# Patient Record
Sex: Female | Born: 1975
Health system: Southern US, Community
[De-identification: ages and names within clinical notes are randomized; demographics above are authoritative.]

## PROBLEM LIST (undated history)

## (undated) DIAGNOSIS — E7800 Pure hypercholesterolemia, unspecified: Secondary | ICD-10-CM

## (undated) DIAGNOSIS — E119 Type 2 diabetes mellitus without complications: Secondary | ICD-10-CM

## (undated) DIAGNOSIS — K759 Inflammatory liver disease, unspecified: Secondary | ICD-10-CM

## (undated) DIAGNOSIS — N83209 Unspecified ovarian cyst, unspecified side: Secondary | ICD-10-CM

## (undated) DIAGNOSIS — I639 Cerebral infarction, unspecified: Secondary | ICD-10-CM

## (undated) DIAGNOSIS — I10 Essential (primary) hypertension: Secondary | ICD-10-CM

## (undated) HISTORY — DX: Cerebral infarction, unspecified: I63.9

---

## 1999-03-14 ENCOUNTER — Encounter: Payer: Self-pay | Admitting: Emergency Medicine

## 1999-03-14 ENCOUNTER — Emergency Department (HOSPITAL_COMMUNITY): Admission: EM | Admit: 1999-03-14 | Discharge: 1999-03-14 | Payer: Self-pay | Admitting: Emergency Medicine

## 1999-07-24 ENCOUNTER — Encounter: Payer: Self-pay | Admitting: Obstetrics & Gynecology

## 1999-07-24 ENCOUNTER — Inpatient Hospital Stay (HOSPITAL_COMMUNITY): Admission: AD | Admit: 1999-07-24 | Discharge: 1999-07-24 | Payer: Self-pay | Admitting: Obstetrics & Gynecology

## 1999-08-28 ENCOUNTER — Inpatient Hospital Stay (HOSPITAL_COMMUNITY): Admission: AD | Admit: 1999-08-28 | Discharge: 1999-08-28 | Payer: Self-pay | Admitting: Obstetrics

## 1999-10-18 ENCOUNTER — Other Ambulatory Visit: Admission: RE | Admit: 1999-10-18 | Discharge: 1999-10-18 | Payer: Self-pay | Admitting: Obstetrics and Gynecology

## 1999-11-17 ENCOUNTER — Inpatient Hospital Stay (HOSPITAL_COMMUNITY): Admission: AD | Admit: 1999-11-17 | Discharge: 1999-11-17 | Payer: Self-pay | Admitting: Obstetrics & Gynecology

## 1999-11-17 ENCOUNTER — Encounter: Payer: Self-pay | Admitting: Obstetrics and Gynecology

## 2000-01-01 ENCOUNTER — Ambulatory Visit (HOSPITAL_COMMUNITY): Admission: RE | Admit: 2000-01-01 | Discharge: 2000-01-01 | Payer: Self-pay | Admitting: *Deleted

## 2000-02-05 ENCOUNTER — Inpatient Hospital Stay (HOSPITAL_COMMUNITY): Admission: AD | Admit: 2000-02-05 | Discharge: 2000-02-05 | Payer: Self-pay | Admitting: *Deleted

## 2000-03-03 ENCOUNTER — Inpatient Hospital Stay (HOSPITAL_COMMUNITY): Admission: AD | Admit: 2000-03-03 | Discharge: 2000-03-03 | Payer: Self-pay | Admitting: Obstetrics & Gynecology

## 2000-03-03 ENCOUNTER — Encounter (INDEPENDENT_AMBULATORY_CARE_PROVIDER_SITE_OTHER): Payer: Self-pay | Admitting: Specialist

## 2000-03-03 ENCOUNTER — Inpatient Hospital Stay (HOSPITAL_COMMUNITY): Admission: AD | Admit: 2000-03-03 | Discharge: 2000-03-07 | Payer: Self-pay | Admitting: Obstetrics & Gynecology

## 2000-03-10 ENCOUNTER — Observation Stay (HOSPITAL_COMMUNITY): Admission: AD | Admit: 2000-03-10 | Discharge: 2000-03-11 | Payer: Self-pay | Admitting: Obstetrics & Gynecology

## 2000-03-10 ENCOUNTER — Encounter: Payer: Self-pay | Admitting: Obstetrics & Gynecology

## 2001-10-21 ENCOUNTER — Other Ambulatory Visit: Admission: RE | Admit: 2001-10-21 | Discharge: 2001-10-21 | Payer: Self-pay | Admitting: Obstetrics and Gynecology

## 2001-12-16 ENCOUNTER — Encounter: Admission: RE | Admit: 2001-12-16 | Discharge: 2002-03-16 | Payer: Self-pay | Admitting: Obstetrics and Gynecology

## 2002-01-12 ENCOUNTER — Inpatient Hospital Stay (HOSPITAL_COMMUNITY): Admission: AD | Admit: 2002-01-12 | Discharge: 2002-01-12 | Payer: Self-pay | Admitting: Obstetrics and Gynecology

## 2002-02-17 ENCOUNTER — Inpatient Hospital Stay (HOSPITAL_COMMUNITY): Admission: AD | Admit: 2002-02-17 | Discharge: 2002-02-19 | Payer: Self-pay | Admitting: Obstetrics and Gynecology

## 2006-05-12 ENCOUNTER — Emergency Department (HOSPITAL_COMMUNITY): Admission: EM | Admit: 2006-05-12 | Discharge: 2006-05-12 | Payer: Self-pay | Admitting: Family Medicine

## 2006-05-26 ENCOUNTER — Emergency Department (HOSPITAL_COMMUNITY): Admission: EM | Admit: 2006-05-26 | Discharge: 2006-05-26 | Payer: Self-pay | Admitting: Emergency Medicine

## 2007-02-28 ENCOUNTER — Inpatient Hospital Stay (HOSPITAL_COMMUNITY): Admission: AD | Admit: 2007-02-28 | Discharge: 2007-02-28 | Payer: Self-pay | Admitting: Obstetrics & Gynecology

## 2010-08-25 NOTE — Discharge Summary (Signed)
[facility]  PatientSHERRAL, DIROCCO Visit Number: [id number] MRN: [id number]          Service Type: Dictated by:   Maryelizabeth Rowan, M.D. Adm. Date:  03/03/00 Disc. Date: 03/07/00                             Discharge Summary  DATE OF BIRTH:                Jan 17, 1976  PRIMARY DIAGNOSES:            Term intrauterine pregnancy status post low transverse cesarean section.  HOSPITAL COURSE:              Patient was admitted in labor.  Did have some augmentation with low dose ______ and IUPC was placed.  Patient progressed to dilation of 7-8 cm, 90%, -1 but then had arrest of active phase of labor and therefore was taken to cesarean delivery.  Please see operative note. Postoperative course was uneventful.  Patient recovered well with no complications.  Patient was discharged home with pain medicine of Percocet. Was bottle feeding well.  Unsure of contraception use at time of discharge. Patient was instructed to follow up at [facility] in six weeks. Dictated by:   Maryelizabeth Rowan, M.D. DD:  07/25/00 TD:  07/26/00 Job: [id number]

## 2010-08-25 NOTE — Op Note (Signed)
NAMEPERLA, ECHAVARRIA                               ACCOUNT NO.:  [id number]   MEDICAL RECORD NO.:  [id number]                   PATIENT TYPE:  INP   LOCATION:  9198                                 FACILITY:  [facility]   PHYSICIAN:  Carrington Clamp, M.D.              DATE OF BIRTH:  04-23-1975   DATE OF PROCEDURE:  02/17/2002  DATE OF DISCHARGE:                                 OPERATIVE REPORT   PREOPERATIVE DIAGNOSES:  Prior cesarean section and suspected large for  gestational age baby in an A2 gestational diabetic.   POSTOPERATIVE DIAGNOSES:  Prior cesarean section and suspected large for  gestational age baby in an A2 gestational diabetic.   PROCEDURE:  Low transverse cesarean section.   SURGEON:  Carrington Clamp, M.D.   ASSISTANT:  Luvenia Redden, M.D.   ANESTHESIA:  Spinal.   ESTIMATED BLOOD LOSS:  700 cc.   IV FLUIDS:  3050 cc.   URINE OUTPUT:  200 cc.   COMPLICATIONS:  None.   FINDINGS:  Female infant vertex, Apgars 9 and 9, weight 8 pounds 5 ounces.  Normal tubes, ovaries, and uterus seen.   MEDICATIONS:  Cefotan and Pitocin.   PATHOLOGY:  None.   COUNTS:  Correct x3.   TECHNIQUE:  After adequate spinal anesthesia was achieved, the patient was  prepped and draped in the usual sterile fashion dorsal supine position with  a leftward tilt.  A Pfannenstiel skin incision was made with the scalpel,  slightly inferior to the prior scar, and then carried down to the fascia  with Bovie cautery.  The fascia was incised in the midline with the scalpel  and carried in a transverse curvilinear manner with the Mayo scissors.   The fascia was reflected superiorly and inferiorly from the rectus muscles  and the rectus muscles split in the midline.  The bellies superior to the  peritoneum was entered into bluntly and the peritoneum was incised in a  superior inferior manner with the Metzenbaum scissors with good  visualization of the bowel and the bladder. The bladder blade was  placed and  the vesicouterine fascia incised with the Metzenbaum scissors and the  bladder flap created bluntly.  The bladder blade was replaced and a 2 cm  transverse incision was made in the upper portion occupational therapy the  lower uterine segment.  This was done until clear fluid was noted on entry  into the amnion.  The incision was then extended in a transverse curvilinear  manner with the bandage scissors and the baby identified in the vertex  presentation and delivered without complication.  The baby was bulb  suctioned, the cord clamped and cut, and the baby handed to waiting  pediatrics.  The placenta was then delivered manually and the uterus exteriorized,  wrapped in wet lap, and cleared of all debris.  Uterine incision was closed  with a running locked  stitch of 0 Monocryl.  Hemostasis was achieved and the  uterus was replaced in the abdominal cavity and the cavity irrigated.  The  gutters were cleared of all debris, the uterine incision reinspected and  found to be hemostatic. The peritoneum was closed with a running stitch of 2-  0 Vicryl that included the rectus muscles separately in an inferior to  superior manner.  The fascia was closed with 0 Vicryl on one side and 0  Monocryl on the right-hand side.  The subcutaneous tissue was irrigated and  rendered hemostatic with the Bovie cautery.  The skin was closed with  staples.  The patient tolerated the procedure well.  Was returned to  recovery room in stable condition.                                               Carrington Clamp, M.D.    MH/MEDQ  D:  02/17/2002  T:  02/17/2002  Job:  [id number]

## 2010-08-25 NOTE — Discharge Summary (Signed)
   [name], [name]                               ACCOUNT NO.:  [id number]   MEDICAL RECORD NO.:  [id number]                   PATIENT TYPE:  INP   LOCATION:  9126                                 FACILITY:  [facility]   PHYSICIAN:  Carrington Clamp, M.D.              DATE OF BIRTH:  Jun 03, 1975   DATE OF ADMISSION:  02/17/2002  DATE OF DISCHARGE:  02/19/2002                                 DISCHARGE SUMMARY   FINAL DIAGNOSES:  1. Intrauterine pregnancy at 38+ weeks gestation.  2. History of prior cesarean section.  3. Fetal macrosomia.  4. Gestational diabetes mellitus, insulin controlled.   PROCEDURE:  Low transverse cesarean section.   SURGEON:  Carrington Clamp, M.D.   ASSISTANT:  Luvenia Redden, M.D.   COMPLICATIONS:  None.   HISTORY OF PRESENT ILLNESS:  This 35 year old G2, P1 presents at 38+ weeks  gestation for a repeat cesarean section. The patient had a prior cesarean  section with her first pregnancy.  This pregnancy, the patient's antepartum  course was complicated by late prenatal care, history of cesarean section,  also gestational diabetes mellitus that was insulin controlled, and a little  bit of a language barrier.  Toward the end of the patient's pregnancy, she  suspected to have a large for gestational age baby.  At 36 weeks, the  patient had an estimated fetal weight of 3800 g.   HOSPITAL COURSE:  At this point, the patient was taken to the operating room  on February 17, 2002, by Dr. Carrington Clamp, where a repeat low transverse  cesarean section was performed with the delivery of an 8 pound 5 ounce  female infant with Apgars of 9 and 9.  The delivery went without  complications.  The patient's postoperative course was benign without  significant fevers.  The patient was felt ready for discharge on  postoperative day #2.   DIET:  The patient was sent home on a regular diet.   ACTIVITY:  She was told to decrease activity.   DISCHARGE MEDICATIONS:  She was  told to continue prenatal vitamins and  FESO4.  She was given Percocet one to two every four hours as needed for  pain.  She was told she could use over-the-counter pain medicines as needed.   FOLLOW UP:  She was to follow up in the office the next day for staple  removal and to check random glucoses daily.     Leilani Able, P.A.-C.                Carrington Clamp, M.D.   MB/MEDQ  D:  04/06/2002  T:  04/06/2002  Job:  [id number]

## 2010-08-25 NOTE — Op Note (Signed)
[facility]  Patient:    [name], [name]                            MRN: [id number] Proc. Date: 03/04/00 Adm. Date:  [id number] Attending:  Antionette Char                           Operative Report  PREOPERATIVE DIAGNOSIS:       Arrested active phase of labor.  POSTOPERATIVE DIAGNOSIS:      Arrested active phase of labor.  OPERATION:                    Low transverse cesarean section.  SURGEON:                      Conni Elliot, M.D.  ANESTHESIA:                   Continuous lumbar epidural.  OPERATIVE FINDINGS:           A 7 pound 8 ounce female, Apgars 8 and 9 and one to five minutes respectively.  Cord pH and placenta were sent.  DESCRIPTION OF PROCEDURE:     After placing the patient under continuous lumbar epidural anesthetic, the patients was placed supine in a left tiltward position and receiving oxygen.  The abdomen was prepped and draped in sterile fashion.  A low transverse Pfannenstiel incision was made.  The incision was made through skin and subcutaneous fascia, rectus muscles separated in the midline.  Peritoneal cavity was entered.  Bladder flap was created.  A low transuterine incision was made.  The baby was in the vertex presentation.  The baby did have meconium, so it was ______ suctioned prior to delivery of the chest.  The remainder of the body was delivered.  The cord was double clamped and cut.  The baby was handed to the neonatologist in attendance.  The placenta was delivered simultaneously.  The uterus, bladder flap, ______ peritoneum, fascia and the ______ closed in usual fashion.  ESTIMATED BLOOD LOSS:  Approximately 800 cc.  Sponge, needle and instrument counts were correct. DD:  03/04/00 TD:  03/05/00 Job: [id number]

## 2011-01-16 LAB — URINALYSIS, ROUTINE W REFLEX MICROSCOPIC
Glucose, UA: 250 — AB
Ketones, ur: 15 — AB
Nitrite: POSITIVE — AB
Protein, ur: >300 — AB
Specific Gravity, Urine: 1.015
Urobilinogen, UA: >8 — ABNORMAL HIGH
pH: 6.5

## 2011-01-16 LAB — POCT PREGNANCY, URINE
Operator id: 14011
Preg Test, Ur: NEGATIVE

## 2011-01-16 LAB — URINE CULTURE: Colony Count: >=100000

## 2011-01-16 LAB — URINE MICROSCOPIC-ADD ON

## 2011-06-17 ENCOUNTER — Emergency Department (HOSPITAL_COMMUNITY): Payer: Self-pay

## 2011-06-17 ENCOUNTER — Other Ambulatory Visit: Payer: Self-pay

## 2011-06-17 ENCOUNTER — Emergency Department (HOSPITAL_COMMUNITY)
Admission: EM | Admit: 2011-06-17 | Discharge: 2011-06-17 | Disposition: A | Discharge status: Home / Self Care | Payer: Self-pay | Attending: Emergency Medicine | Admitting: Emergency Medicine

## 2011-06-17 ENCOUNTER — Encounter (HOSPITAL_COMMUNITY): Payer: Self-pay | Admitting: Emergency Medicine

## 2011-06-17 DIAGNOSIS — I10 Essential (primary) hypertension: Secondary | ICD-10-CM | POA: Insufficient documentation

## 2011-06-17 DIAGNOSIS — R079 Chest pain, unspecified: Secondary | ICD-10-CM | POA: Insufficient documentation

## 2011-06-17 DIAGNOSIS — Z79899 Other long term (current) drug therapy: Secondary | ICD-10-CM | POA: Insufficient documentation

## 2011-06-17 DIAGNOSIS — R1013 Epigastric pain: Secondary | ICD-10-CM | POA: Insufficient documentation

## 2011-06-17 DIAGNOSIS — E7800 Pure hypercholesterolemia, unspecified: Secondary | ICD-10-CM | POA: Insufficient documentation

## 2011-06-17 HISTORY — DX: Pure hypercholesterolemia, unspecified: E78.00

## 2011-06-17 HISTORY — DX: Essential (primary) hypertension: I10

## 2011-06-17 LAB — COMPREHENSIVE METABOLIC PANEL
ALT: 58 U/L — ABNORMAL HIGH (ref 0–35)
AST: 29 U/L (ref 0–37)
Albumin: 3.8 g/dL (ref 3.5–5.2)
Alkaline Phosphatase: 51 U/L (ref 39–117)
BUN: 9 mg/dL (ref 6–23)
CO2: 23 mEq/L (ref 19–32)
Calcium: 9.2 mg/dL (ref 8.4–10.5)
Chloride: 106 mEq/L (ref 96–112)
Creatinine, Ser: 0.72 mg/dL (ref 0.50–1.10)
GFR calc Af Amer: >90 mL/min (ref >90)
GFR calc non Af Amer: >90 mL/min (ref >90)
Glucose, Bld: 108 mg/dL — ABNORMAL HIGH (ref 70–99)
Potassium: 3.7 mEq/L (ref 3.5–5.1)
Sodium: 139 mEq/L (ref 135–145)
Total Bilirubin: 0.5 mg/dL (ref 0.3–1.2)
Total Protein: 7.4 g/dL (ref 6.0–8.3)

## 2011-06-17 LAB — CBC
HCT: 38.7 % (ref 36.0–46.0)
Hemoglobin: 13.9 g/dL (ref 12.0–15.0)
MCH: 30 pg (ref 26.0–34.0)
MCHC: 35.9 g/dL (ref 30.0–36.0)
MCV: 83.6 fL (ref 78.0–100.0)
Platelets: 303 10*3/uL (ref 150–400)
RBC: 4.63 MIL/uL (ref 3.87–5.11)
RDW: 12.5 % (ref 11.5–15.5)
WBC: 5.8 10*3/uL (ref 4.0–10.5)

## 2011-06-17 LAB — DIFFERENTIAL
Basophils Absolute: 0 10*3/uL (ref 0.0–0.1)
Basophils Relative: 0 % (ref 0–1)
Eosinophils Absolute: 0.1 10*3/uL (ref 0.0–0.7)
Eosinophils Relative: 1 % (ref 0–5)
Lymphocytes Relative: 31 % (ref 12–46)
Lymphs Abs: 1.8 10*3/uL (ref 0.7–4.0)
Monocytes Absolute: 0.4 10*3/uL (ref 0.1–1.0)
Monocytes Relative: 7 % (ref 3–12)
Neutro Abs: 3.6 10*3/uL (ref 1.7–7.7)
Neutrophils Relative %: 62 % (ref 43–77)

## 2011-06-17 LAB — LIPASE, BLOOD: Lipase: 33 U/L (ref 11–59)

## 2011-06-17 MED ORDER — OMEPRAZOLE 20 MG PO CPDR: 20.0000 mg | DELAYED_RELEASE_CAPSULE | Freq: Every day | ORAL | Status: AC

## 2011-06-17 MED ORDER — FAMOTIDINE IN NACL 20-0.9 MG/50ML-% IV SOLN
20.0000 mg | Freq: Once | INTRAVENOUS | Status: AC
  Administered 2011-06-17: 20 mg via INTRAVENOUS
  Filled 2011-06-17: qty 50

## 2011-06-17 MED ORDER — ONDANSETRON HCL 4 MG/2ML IJ SOLN
4.0000 mg | Freq: Once | INTRAMUSCULAR | Status: AC
  Administered 2011-06-17: 4 mg via INTRAVENOUS
  Filled 2011-06-17: qty 2

## 2011-06-17 MED ORDER — GI COCKTAIL ~~LOC~~
30.0000 mL | Freq: Once | ORAL | Status: AC
  Administered 2011-06-17: 30 mL via ORAL
  Filled 2011-06-17: qty 30

## 2011-06-17 NOTE — ED Notes (Signed)
Pt presents with c/o epigastric pain radiating into abdomen x 2 weeks.

## 2011-06-17 NOTE — ED Provider Notes (Signed)
History     CSN: [id number]  Arrival date & time 06/17/11  1122   First MD Initiated Contact with Patient 06/17/11 1230      Chief Complaint  Patient presents with  . Chest Pain    HPI The patient presents with 2 weeks of pain in her epigastrium.  The pain is burning, radiating from her epigastrium to her sternum.  Pain is not relieved with OTC medication.  The pain is not exertional or pleuritic.  The patient denies dyspnea.  Denies any vomiting or diarrhea. Past Medical History  Diagnosis Date  . Hypertension   . Hypercholesteremia     Past Surgical History  Procedure Date  . Cesarean section     History reviewed. No pertinent family history.  History  Substance Use Topics  . Smoking status: Never Smoker   . Smokeless tobacco: Not on file  . Alcohol Use: No    OB History    Grav Para Term Preterm Abortions TAB SAB Ect Mult Living                  Review of Systems  All other systems reviewed and are negative.    Allergies  Review of patient's allergies indicates no known allergies.  Home Medications   Current Outpatient Rx  Name Route Sig Dispense Refill  . ATORVASTATIN CALCIUM 20 MG PO TABS Oral Take 20 mg by mouth daily.    Marland Kitchen LISINOPRIL-HYDROCHLOROTHIAZIDE 20-25 MG PO TABS Oral Take 1 tablet by mouth daily.    Marland Kitchen METFORMIN HCL 500 MG PO TABS Oral Take 500 mg by mouth daily.      BP 134/91  Pulse 91  Temp(Src) 98.6 F (37 C) (Oral)  Resp 17  SpO2 97%  LMP 06/11/2011  Physical Exam  Nursing note and vitals reviewed. Constitutional: She is oriented to person, place, and time. She appears well-developed and well-nourished. No distress.  HENT:  Head: Normocephalic and atraumatic.  Eyes: Conjunctivae and EOM are normal.  Cardiovascular: Normal rate and regular rhythm.   Pulmonary/Chest: Effort normal and breath sounds normal. No stridor. No respiratory distress.  Abdominal: She exhibits no distension.  Musculoskeletal: She exhibits no edema.    Neurological: She is alert and oriented to person, place, and time. No cranial nerve deficit.  Skin: Skin is warm and dry.  Psychiatric: She has a normal mood and affect.    ED Course  Procedures (including critical care time)   Labs Reviewed  CBC  DIFFERENTIAL  COMPREHENSIVE METABOLIC PANEL  LIPASE, BLOOD   No results found.   No diagnosis found.   Date: 06/17/2011  Rate: 88  Rhythm: normal sinus rhythm  QRS Axis: normal  Intervals: normal  ST/T Wave abnormalities: normal  Conduction Disutrbances:none  Narrative Interpretation:   Old EKG Reviewed: none available Pronounced Q-waves anterior BORDERLINE ECG  CXR reveiwed by me  Pulse ox 100% ra- normal  Cardiac 85 sr, normal   2:49 PM The patient notes a substantial improvement in her condition following GI cocktail, fluids, Pepcid. MDM  This generally well 36 year old female presents with chest pain, which the patient specifies as epigastric discomfort with burning sensation to her sternum.  The patient's youth, the absence of cardiac risk factors, her improvement following a GI cocktail and Pepcid his reassuring for the high probability of this being gastric in origin.  The patient's labs were reassuring, as were her unremarkable vital signs and essentially unremarkable a CT G.  The patient was discharged in stable  condition on a proton pump inhibitor.  Gerhard Munch, MD 06/17/11 1450

## 2012-09-21 ENCOUNTER — Ambulatory Visit (INDEPENDENT_AMBULATORY_CARE_PROVIDER_SITE_OTHER): Payer: BC Managed Care – PPO | Admitting: Emergency Medicine

## 2012-09-21 VITALS — BP 160/110 | HR 93 | Temp 98.2°F | Resp 16 | Ht 60.25 in | Wt 153.2 lb

## 2012-09-21 DIAGNOSIS — Z0000 Encounter for general adult medical examination without abnormal findings: Secondary | ICD-10-CM

## 2012-09-21 DIAGNOSIS — E782 Mixed hyperlipidemia: Secondary | ICD-10-CM

## 2012-09-21 DIAGNOSIS — I10 Essential (primary) hypertension: Secondary | ICD-10-CM

## 2012-09-21 DIAGNOSIS — E119 Type 2 diabetes mellitus without complications: Secondary | ICD-10-CM

## 2012-09-21 LAB — POCT URINALYSIS DIPSTICK
Bilirubin, UA: NEGATIVE
Blood, UA: NEGATIVE
Glucose, UA: 100
Ketones, UA: NEGATIVE
Leukocytes, UA: NEGATIVE
Nitrite, UA: NEGATIVE
Protein, UA: NEGATIVE
Spec Grav, UA: <=1.005
Urobilinogen, UA: 0.2
pH, UA: 6

## 2012-09-21 LAB — POCT CBC
Granulocyte percent: 64.7 %G (ref 37–80)
HCT, POC: 48.7 % — AB (ref 37.7–47.9)
Hemoglobin: 15.6 g/dL (ref 12.2–16.2)
Lymph, poc: 1.8 (ref 0.6–3.4)
MCH, POC: 27.7 pg (ref 27–31.2)
MCHC: 32 g/dL (ref 31.8–35.4)
MCV: 86.4 fL (ref 80–97)
MID (cbc): 0.6 (ref 0–0.9)
MPV: 9.8 fL (ref 0–99.8)
POC Granulocyte: 4.3 (ref 2–6.9)
POC LYMPH PERCENT: 26.7 %L (ref 10–50)
POC MID %: 8.6 %M (ref 0–12)
Platelet Count, POC: 317 10*3/uL (ref 142–424)
RBC: 5.64 M/uL — AB (ref 4.04–5.48)
RDW, POC: 13.3 %
WBC: 6.6 10*3/uL (ref 4.6–10.2)

## 2012-09-21 LAB — LIPID PANEL
Cholesterol: 215 mg/dL — ABNORMAL HIGH (ref 0–200)
HDL: 60 mg/dL (ref >39)
LDL Cholesterol: 127 mg/dL — ABNORMAL HIGH (ref 0–99)
Total CHOL/HDL Ratio: 3.6 Ratio
Triglycerides: 140 mg/dL (ref <150)
VLDL: 28 mg/dL (ref 0–40)

## 2012-09-21 LAB — COMPREHENSIVE METABOLIC PANEL
ALT: 37 U/L — ABNORMAL HIGH (ref 0–35)
AST: 19 U/L (ref 0–37)
Albumin: 4.1 g/dL (ref 3.5–5.2)
Alkaline Phosphatase: 80 U/L (ref 39–117)
BUN: 10 mg/dL (ref 6–23)
CO2: 27 mEq/L (ref 19–32)
Calcium: 9.2 mg/dL (ref 8.4–10.5)
Chloride: 103 mEq/L (ref 96–112)
Creat: 0.66 mg/dL (ref 0.50–1.10)
Glucose, Bld: 232 mg/dL — ABNORMAL HIGH (ref 70–99)
Potassium: 4.2 mEq/L (ref 3.5–5.3)
Sodium: 137 mEq/L (ref 135–145)
Total Bilirubin: 0.7 mg/dL (ref 0.3–1.2)
Total Protein: 6.9 g/dL (ref 6.0–8.3)

## 2012-09-21 LAB — POCT WET PREP WITH KOH
Clue Cells Wet Prep HPF POC: 100
KOH Prep POC: NEGATIVE
Trichomonas, UA: NEGATIVE
Yeast Wet Prep HPF POC: NEGATIVE

## 2012-09-21 LAB — POCT UA - MICROSCOPIC ONLY
Bacteria, U Microscopic: NEGATIVE
Casts, Ur, LPF, POC: NEGATIVE
Crystals, Ur, HPF, POC: NEGATIVE
Mucus, UA: NEGATIVE
RBC, urine, microscopic: NEGATIVE
Yeast, UA: NEGATIVE

## 2012-09-21 LAB — TSH: TSH: 3.369 u[IU]/mL (ref 0.350–4.500)

## 2012-09-21 LAB — POCT GLYCOSYLATED HEMOGLOBIN (HGB A1C): Hemoglobin A1C: 10.9

## 2012-09-21 MED ORDER — METRONIDAZOLE 500 MG PO TABS: 500.0000 mg | ORAL_TABLET | Freq: Two times a day (BID) | ORAL | Status: DC

## 2012-09-21 MED ORDER — LISINOPRIL-HYDROCHLOROTHIAZIDE 20-25 MG PO TABS: 1.0000 | ORAL_TABLET | Freq: Every day | ORAL | Status: DC

## 2012-09-21 MED ORDER — METFORMIN HCL ER 500 MG PO TB24: ORAL_TABLET | ORAL | Status: DC

## 2012-09-21 NOTE — Progress Notes (Signed)
[facility] [address] [place] [address] [phone]  Date:  09/21/2012   Name:  [name]   DOB:  08-15-1975   MRN:  [id number]  PCP:  No PCP Per Patient    Chief Complaint: Annual Exam   History of Present Illness:  [name] is a 37 y.o. very pleasant female patient who presents with the following:  For wellness examination.  No medication. Non english speaker.  Up to date on immunizations.  Works in a [employer].  Was on lipid drug, metformin, lisinopril but stopped due to cost.  There are no active problems to display for this patient.   Past Medical History  Diagnosis Date  . Hypertension   . Hypercholesteremia     Past Surgical History  Procedure Laterality Date  . Cesarean section      History  Substance Use Topics  . Smoking status: Never Smoker   . Smokeless tobacco: Not on file  . Alcohol Use: No    No family history on file.  No Known Allergies  Medication list has been reviewed and updated.  No current outpatient prescriptions on file prior to visit.   No current facility-administered medications on file prior to visit.    Review of Systems:  As per HPI, otherwise negative.    Physical Examination: Filed Vitals:   09/21/12 0829  BP: 160/110  Pulse: 93  Temp: 98.2 F (36.8 C)  Resp: 16   Filed Vitals:   09/21/12 0829  Height: 5' 0.25" (1.53 m)  Weight: 153 lb 3.2 oz (69.491 kg)   Body mass index is 29.69 kg/(m^2). Ideal Body Weight: Weight in (lb) to have BMI = 25: 128.8  GEN: obese, NAD, Non-toxic, A & O x 3 HEENT: Atraumatic, Normocephalic. Neck supple. No masses, No LAD. Ears and Nose: No external deformity. CV: RRR, No M/G/R. No JVD. No thrill. No extra heart sounds. PULM: CTA B, no wheezes, crackles, rhonchi. No retractions. No resp. distress. No accessory muscle use. ABD: S, NT, ND, +BS. No rebound. No HSM. EXTR: No c/c/e NEURO Normal gait.  PSYCH: Normally interactive. Conversant. Not  depressed or anxious appearing.  Calm demeanor.  Pelvic - normal external genitalia, vulva, vagina, cervix, uterus and adnexa   Assessment and Plan: Hypertension NIDDM Hyperlipidemia by history Treat based on labs Follow up in 104 in 1 month She has little insight into medical problems or the need to chronically treat them.  BV  Signed,  Phillips Odor, MD   Results for orders placed in visit on 09/21/12  POCT CBC      Result Value Range   WBC 6.6  4.6 - 10.2 K/uL   Lymph, poc 1.8  0.6 - 3.4   POC LYMPH PERCENT 26.7  10 - 50 %L   MID (cbc) 0.6  0 - 0.9   POC MID % 8.6  0 - 12 %M   POC Granulocyte 4.3  2 - 6.9   Granulocyte percent 64.7  37 - 80 %G   RBC 5.64 (*) 4.04 - 5.48 M/uL   Hemoglobin 15.6  12.2 - 16.2 g/dL   HCT, POC 96.2 (*) 95.2 - 47.9 %   MCV 86.4  80 - 97 fL   MCH, POC 27.7  27 - 31.2 pg   MCHC 32.0  31.8 - 35.4 g/dL   RDW, POC 84.1     Platelet Count, POC 317  142 - 424 K/uL   MPV 9.8  0 - 99.8 fL  POCT UA - MICROSCOPIC ONLY      Result Value Range   WBC, Ur, HPF, POC 0-1     RBC, urine, microscopic neg     Bacteria, U Microscopic neg     Mucus, UA neg     Epithelial cells, urine per micros 1-2     Crystals, Ur, HPF, POC neg     Casts, Ur, LPF, POC neg     Yeast, UA neg    POCT URINALYSIS DIPSTICK      Result Value Range   Color, UA yellow     Clarity, UA clear     Glucose, UA 100     Bilirubin, UA neg     Ketones, UA neg     Spec Grav, UA <=1.005     Blood, UA neg     pH, UA 6.0     Protein, UA neg     Urobilinogen, UA 0.2     Nitrite, UA neg     Leukocytes, UA Negative    POCT GLYCOSYLATED HEMOGLOBIN (HGB A1C)      Result Value Range   Hemoglobin A1C 10.9    POCT WET PREP WITH KOH      Result Value Range   Trichomonas, UA Negative     Clue Cells Wet Prep HPF POC 100%     Epithelial Wet Prep HPF POC tntc     Yeast Wet Prep HPF POC neg     Bacteria Wet Prep HPF POC 4+     RBC Wet Prep HPF POC 0-1     WBC Wet Prep HPF POC 4-10      KOH Prep POC Negative

## 2012-09-21 NOTE — Patient Instructions (Addendum)
Cholesterol Cholesterol is a white, waxy, fat-like protein needed by your body in small amounts. The liver makes all the cholesterol you need. It is carried from the liver by the blood through the blood vessels. Deposits (plaque) may build up on blood vessel walls. This makes the arteries narrower and stiffer. Plaque increases the risk for heart attack and stroke. You cannot feel your cholesterol level even if it is very high. The only way to know is by a blood test to check your lipid (fats) levels. Once you know your cholesterol levels, you should keep a record of the test results. Work with your caregiver to to keep your levels in the desired range. WHAT THE RESULTS MEAN:  Total cholesterol is a rough measure of all the cholesterol in your blood.  LDL is the so-called bad cholesterol. This is the type that deposits cholesterol in the walls of the arteries. You want this level to be low.  HDL is the good cholesterol because it cleans the arteries and carries the LDL away. You want this level to be high.  Triglycerides are fat that the body can either burn for energy or store. High levels are closely linked to heart disease. DESIRED LEVELS:  Total cholesterol below 200.  LDL below 100 for people at risk, below 70 for very high risk.  HDL above 50 is good, above 60 is best.  Triglycerides below 150. HOW TO LOWER YOUR CHOLESTEROL:  Diet.  Choose fish or white meat chicken and [place], roasted or baked. Limit fatty cuts of red meat, fried foods, and processed meats, such as sausage and lunch meat.  Eat lots of fresh fruits and vegetables. Choose whole grains, beans, pasta, potatoes and cereals.  Use only small amounts of olive, corn or canola oils. Avoid butter, mayonnaise, shortening or palm kernel oils. Avoid foods with trans-fats.  Use skim/nonfat milk and low-fat/nonfat yogurt and cheeses. Avoid whole milk, cream, ice cream, egg yolks and cheeses. Healthy desserts include angel food  cake, ginger snaps, animal crackers, hard candy, popsicles, and low-fat/nonfat frozen yogurt. Avoid pastries, cakes, pies and cookies.  Exercise.  A regular program helps decrease LDL and raises HDL.  Helps with weight control.  Do things that increase your activity level like gardening, walking, or taking the stairs.  Medication.  May be prescribed by your caregiver to help lowering cholesterol and the risk for heart disease.  You may need medicine even if your levels are normal if you have several risk factors. HOME CARE INSTRUCTIONS   Follow your diet and exercise programs as suggested by your caregiver.  Take medications as directed.  Have blood work done when your caregiver feels it is necessary. MAKE SURE YOU:   Understand these instructions.  Will watch your condition.  Will get help right away if you are not doing well or get worse. Document Released: 12/19/2000 Document Revised: 06/18/2011 Document Reviewed: 06/11/2007 [facility] Patient Information 2014 [place], [place]. T?ng Huy?t p (Hypertension) Khi tim ??p, n ??y mu l?u thng qua cc ??ng m?ch. L?c ??y ny ???c g?i l huy?t p. N?u huy?t p qu cao, ng??i ta g?i ? l ch?ng t?ng huy?t p (HTN) hay cao huy?t p. Ch?ng t?ng huy?t p r?t nguy hi?m v b?n c th? m?c ph?i m khng hay bi?t g. Huy?t p cao c ngh?a l tim c?a b?n ph?i lm vi?c nhi?u h?n ?? b?m mu. Cc ??ng m?ch c th? b? h?p ho?c x? c?ng. T?ng cng cho tim lm b?n c nguy  c? m?c b?nh tim, ??t qu?, v cc b?nh l khc. Huy?t p bao g?m hai con s?, m?t ch? s? cao h?n trn m?t ch? s? th?p h?n, v d? nh? 110/72. Huy?t p ???c ghi l "110 trn 72". L t??ng l d??i 120 cho ch? s? trn (tm thu) v d??i 80 cho ch? s? d??i (tm tr??ng). Ghi huy?t p c?a b?n ngy hm nay.  B?n nn h?t s?c ch  ??n huy?t p c?a mnh n?u ?ang m?c ph?i nh?ng c?n b?nh no ? nh? l:  Suy tim  Ti?n s? b? ?au tim  Ti?u ???ng  B?nh th?n mn tnh  Ti?n s? ??t qu?  Nhi?u nguy  c? m?c b?nh tim. ?? xem c b? m?c ch?ng t?ng huy?t p hay khng, b?n nn ?o huy?t p khi ?ang ng?i v?i cnh tay ???c ??t ngang t?m tim c?a b?n. Nn ?o huy?t p t nh?t hai l?n. Ch? s? huy?t p cao m?t l?n (??c bi?t l ? [place] C?p C?u) khng c ngh?a l b?n c?n ph?i ???c ?i?u tr?. C th? c cc c?n b?nh m huy?t p khc nhau gi?a tay tri v tay ph?i. ?i?u quan tr?ng l ph?i s?m ?i khm ?? Bc s? ki?m tra l?i. ?a s? ng??i m?c ph?i ch?ng t?ng huy?t p nguyn pht, c ngh?a l khng c nguyn nhn c? th?. C th? lm gi?m d?ng cao huy?t p ny b?ng cch thay ??i y?u t? l?i s?ng nh? l:  C?ng th?ng  Ht thu?c  Thi?u t?p th? d?c  Th?a cn  S? d?ng ma ty/thu?c l/r??u  Ch? ?? ?n t mu?i ?a s? ng??i khng c cc tri?u ch?ng do cao huy?t p cho ??n khi b?nh gy t?n h?i ??n c? th?. Vi?c ?i?u tr? hi?u qu? th??ng c th? ng?n ch?n, tr hon hay gi?m m?c ?? t?n h?i ?. ?I?U TR? Vi?c ?i?u tr? ch?ng cao huy?t p, khi ? xc ??nh ???c nguyn nhn, ???c nh?m vo nguyn nhn gy b?nh ?. C r?t nhi?u thu?c ?i?u tr? ch?ng t?ng huy?t p. Cc thu?c ny c th? ???c chia ra thnh vi lo?i, v Bc s? s? gip b?n ch?n thu?c t?t nh?t cho mnh. Thu?c c th? c cc tc d?ng ph?. B?n v Bc s? nn xem l?i nh?ng tc d?ng ph? ?. N?u huy?t p v?n cao sau khi b?n ? thay ??i l?i s?ng ho?c b?t ??u dng thu?c th,  C th? c?n ph?i thay ??i (cc) thu?c.  C th? c?n ph?i ch tm ??n nh?ng v?n ?? khc.  Ph?i ch?c ch?n r?ng b?n hi?u toa thu?c, v bi?t r khi no dng thu?c v dng thu?c nh? th? no.  Ph?i ch?c ch?n r?ng b?n g?p Bc s? ?? ti khm trong khung th?i gian ???c khuy?n co (th??ng l trong vng hai tu?n) ?? ki?m tra l?i huy?t p v xem l?i thu?c.  N?u dng nhi?u h?n m?t lo?i thu?c ?? h? huy?t p, ph?i ch?c ch?n r?ng b?n bi?t khi no nn dng thu?c v dng nh? th? no. Dng cng lc hai lo?i thu?c c th? d?n ??n huy?t p h? qu th?p. NH?P VI?N NGAY L?P T?C N?U XU?T HI?N:  Nh?c ??u d? d?i, th? l?c thay ??i hay b?  m?, ho?c l l?n.  Y?u ho?c t li?t b?t th??ng, ho?c c c?m gic chong ng?t.  ?au b?ng hay ng?c tr?m tr?ng, i m?a, ho?c kh th?. HY CH?C CH?N R?NG B?N:  Hi?u nh?ng ch? d?n ny.  S? theo  di tnh tr?ng c?a b?n.  S? nh?n ???c s? gip ?? ngay l?p t?c n?u b?n khng kh?e ho?c tr? nn t? h?n. Document Released: 03/26/2005 Document Revised: 06/18/2011 [facility] Patient Information 2014 [place], [place]. ?i Tho ???ng Lo?i 2, Ng??i L?n  (Type 2 Diabetes Mellitus, Adult) ?i tho ???ng lo?i 2, th??ng g?i ??n gi?n l b?nh ti?u ???ng lo?i 2, l m?t b?nh ko di (mn tnh). Trong b?nh ti?u ???ng lo?i 2, tuy?n t?y khng s?n xu?t ?? insulin (hocmon), cc t? bo t ?p ?ng v?i insulin ???c t?o ra (?? khng insulin) ho?c c? hai. Thng th??ng, insulin v?n chuy?n ???ng t? th?c ?n vo cc t? bo m. Cc t? bo m s? d?ng ???ng cho n?ng l??ng. Thi?u h?t insulin ho?c khng ?p ?ng bnh th??ng v?i insulin gy ra ???ng d? th?a tch t? trong mu thay v ?i vo cc t? bo m. K?t qu? l, l??ng ???ng trong mu cao (t?ng ???ng huy?t) pht tri?n. ?nh h??ng c?a hm l??ng ???ng (glucoza) cao c th? gy ra nhi?u bi?n ch?ng.  B?nh ti?u ???ng lo?i 2 tr??c ?y cn ???c g?i l b?nh ti?u ???ng kh?i pht ? ng??i l?n nh?ng n c th? x?y ra ? m?i l?a tu?i.  CC Y?U T? NGUY C?  M?t ng??i d? pht tri?n b?nh ti?u ???ng lo?i 2 n?u c ai ? trong gia ?nh b? b?nh ny ??ng th?i c m?t ho?c nhi?u y?u t? nguy c? chnh sau ?y:   Th?a cn.  L?i s?ng t ho?t ??ng.  Ti?n s? lin t?c ?n th?c ?n giu n?ng l??ng. Duy tr cn n?ng bnh th??ng v ho?t ??ng th? ch?t th??ng xuyn c th? lm gi?m nguy c? pht tri?n b?nh ti?u ???ng lo?i 2.  TRI?U CH?NG  M?t ng??i b? b?nh ti?u ???ng lo?i 2 c th? khng c cc tri?u ch?ng ban ??u. Cc tri?u ch?ng c?a b?nh ti?u ???ng lo?i 2 xu?t hi?n t? t?. Cc tri?u ch?ng bao g?m:   Gia t?ng kht n??c (ch?ng kht nhi?u).  Gia t?ng ti?u ti?n (?a ni?u).  T?ng ?i ti?u vo ban ?m (ti?u ?m).  Gi?m cn. Hi?n  t??ng gi?m cn ny c th? r?t nhanh.  Th??ng xuyn b? nhi?m trng ti pht.  M?t m?i.  Suy nh??c.  Thay ??i th? l?c, nh? nhn m?.  Mi tri cy trong h?i th? c?a b?n.  ?au b?ng.  Bu?n nn ho?c nn m?a.  V?t c?t ho?c v?t b?m tm lu lnh.  Ng?a ran ho?c t ? bn tay ho?c bn chn. CH?N ?ON  B?nh ti?u ???ng lo?i 2 th??ng khng ???c ch?n ?on cho ??n khi xu?t hi?n bi?n ch?ng c?a b?nh ti?u ???ng. B?nh ti?u ???ng lo?i 2 ???c ch?n ?on khi xu?t hi?n cc tri?u ch?ng c?ng nh? bi?n ch?ng v khi l??ng ???ng huy?t t?ng. L??ng ???ng huy?t c th? ???c ki?m tra b?ng m?t ho?c nhi?u xt nghi?m mu sau ?y:   Xt nghi?m ???ng huy?t nh?n ?n. B?n s? khng ???c php ?n trong t nh?t 8 ti?ng tr??c khi l?y m?u mu.  Xt nghi?m ???ng huy?t ng?u nhin. ???ng huy?t ???c xt nghi?m b?t k? lc no trong ngy, b?t k? b?n ?n lc no.  Xt nghi?m ???ng huy?t A1c huy?t c?u t?. Xt nghi?m A1c huy?t c?u t? cung c?p thng tin v? ki?m sot ???ng huy?t trong 3 thng tr??c ?.  Xt nghi?m dung n?p glucoza theo ???ng u?ng (OGTT). ???ng huy?t c?a b?n ???c ?o sau khi b?n ch?a ?n (?n  chay) trong 2 gi? v sau ? sau khi b?n u?ng ?? u?ng c ch?a glucoza. ?I?U TR?   B?n c th? c?n dng insulin ho?c thu?c tr? ti?u ???ng hng ngy ?? gi? cho l??ng ???ng huy?t trong ph?m vi mong mu?n.  B?n s? c?n s? d?ng li?u insulin ph h?p v?i vi?c t?p th? d?c v l?a ch?n th?c ph?m c l?i cho s?c kh?e. M?c tiu ?i?u tr? l ?? duy tr l??ng ???ng trong mu tr??c b?a ?n (glucoza tr??c b?a ?n) ? m?c 70-130 mg/dL.  H??NG D?N CH?M [place] T?I NH   L??ng A1c huy?t c?u t? c?a b?n ???c ki?m tra hai l?n m?t n?m.  Th?c hi?n gim st ???ng huy?t hng ngy theo ch? d?n c?a chuyn gia ch?m [place] y t?.  [place] n??c ti?u khi b?n b? b?nh v theo ch? d?n c?a chuyn gia ch?m [place] y t?.  S? d?ng thu?c tr? ti?u ???ng ho?c insulin theo ch? d?n c?a chuyn gia ch?m [place] y t? ?? duy tr l??ng ???ng huy?t trong ph?m vi mong mu?n.  Khng bao gi? ?? h?t  thu?c tr? ti?u ???ng ho?c insulin. Insulin c?n hng ngy.  ?i?u ch?nh insulin d?a vo m?c ?? s? d?ng hy?rat-cacbon c?a b?n. Hy?rat-cacbon c th? lm t?ng l??ng ???ng huy?t nh?ng c?n ph?i ???c bao g?m trong ch? ?? ?n u?ng c?a b?n. Hy?rat-cacbon cung c?p vitamin, khong ch?t v ch?t x?, l m?t ph?n thi?t y?u c?a ch? ?? ?n u?ng lnh m?nh. Hy?rat-cacbon ???c tm th?y trong tri cy, rau, ng? c?c, cc s?n ph?m t? s?a, cc lo?i ??u v cc lo?i th?c ph?m c ch?a thm ???ng.    ?n th?c ph?m c l?i cho s?c kh?e. Xen k? 3 b?a ?n v?i 3 mn ?n nh?.  Gi?m cn n?u th?a cn.  Mang theo th? c?nh bo y t? ho?c ?eo ?? trang s?c c?nh bo y t?.  Mang theo ?? ?n nh? ch?a 15 gam hy?rat-cacbon cng b?n m?i lc ?? ?i?u tr? h? ???ng huy?t (h? ???ng huy?t). M?t s? v d? v? ?? ?n nh? ch?a 15 gam hy?rat-cacbon bao g?m:  Vin nn glucoza, 3 ho?c 4   Gel glucoza, ?ng 15 gam  Nho kh, 2 mu?ng (24 gam)  Th?ch hnh h?t ??u, 6  Bnh quy hnh con gi?ng, 8  N??c u?ng c b?t thng th??ng, 4 aox? (120 ml)  K?o chp chp, 9  Pht hi?n h? ???ng huy?t. H? ???ng huy?t x?y ra v?i l??ng ???ng huy?t t? 70 mg/dL tr? xu?ng. Nguy c? h? ???ng huy?t gia t?ng khi nh?n ?n ho?c b? b?a, trong v sau khi t?p th? d?c c??ng ?? cao v trong khi ng?. Cc tri?u ch?ng h? ???ng huy?t c th? bao g?m:  Run ho?c l?c.  Gi?m kh? n?ng t?p trung.  ?? m? hi.  Nh?p tim t?ng.  ?au ??u.  Kh mi?ng.  ?i.  Tnh d? kch thch.  Lo u.  Ng? b?n ch?n.  Thay ??i l?i ni ho?c s? ph?i h?p.  B? l?n.  ?i?u tr? h? ???ng huy?t k?p th?i. N?u b?n t?nh to v c th? nu?t m?t cch an ton, hy theo quy t?c 15:15:  Dng 15-20 gam glucoza ho?c hy?rat-cacbon tc ??ng nhanh. L?a ch?n tc ??ng nhanh bao g?m gel glucoza, vin nn glucoza ho?c 4 aox? (120 ml) n??c p tri cy, soda bnh th??ng ho?c s?a t bo.  Ki?m tra l??ng ???ng huy?t c?a b?n 15 pht sau khi u?ng glucoza.  Dng t? 15-20 gam glucoza tr? ln n?u l??ng ???ng huy?t ???c ?o l?i v?n ?  m?c 70 mg/dL tr? xu?ng.  ?n m?t b?a ?n bnh th??ng ho?c ?? ?n nh? trong vng 1 gi? sau khi l??ng ???ng huy?t tr? l?i bnh th??ng.    Hy ??  ch?ng ?a ni?u v ch?ng kht nhi?u, ? l nh?ng d?u hi?u s?m t?ng ???ng huy?t. Vi?c pht hi?n t?ng ???ng huy?t s?m cho php ?i?u tr? k?p th?i. ?i?u tr? t?ng ???ng huy?t theo ch? d?n c?a chuyn gia ch?m [place] y t?.  M?i tu?n tham gia vo t nh?t 150 pht ho?t ??ng th? ch?t c??ng ?? trung bnh, tr?i r?ng trn t nh?t 3 ngy trong tu?n ho?c theo ch? d?n c?a chuyn gia ch?m [place] y t?. Ngoi ra, b?n nn tham gia vo bi t?p s?c ?? khng t nh?t 2 l?n m?t tu?n ho?c theo ch? d?n c?a chuyn gia ch?m [place] y t?.  ?i?u ch?nh thu?c v l??ng th?c ?n khi c?n n?u b?n b?t ??u bi t?p ho?c mn th? thao m?i.  Theo k? ho?ch ngy b?nh c?a b?n b?t c? lc no b?n khng th? ?n ho?c u?ng bnh th??ng.  Trnh s? d?ng thu?c l.  Gi?i h?n l??ng r??u b?n u?ng khng qu 1 ly m?i ngy v?i ph? n? khng mang thai v 2 ly m?i ngy v?i nam gi?i. B?n ch? nn u?ng r??u km theo ?n. Ni chuy?n v?i chuyn gia ch?m [place] y t? xem u?ng r??u c an ton cho b?n khng. Cho chuyn gia ch?m [place] y t? bi?t n?u b?n u?ng r??u vi l?n m?t tu?n.  G?p chuyn gia ch?m [place] y t? ?? khm l?i th??ng xuyn.  S?p x?p bu?i khm m?t ngay sau khi ch?n ?on b?nh ti?u ???ng lo?i 2 v sau ? hng n?m.  Th?c hi?n ch?m [place] da v bn chn hng ngy. Ki?m tra da v bn chn hng ngy xem c v?t c?t, v?t b?m tm, t?y ??, v?n ?? v? mng tay, ch?y mu, m?n n??c hay l? lot khng. Bn chn c?n ???c khm b?i chuyn gia ch?m [place] y t? hng n?m.  ?nh r?ng v n??u t nh?t hai l?n m?t ngy v dng ch? nha khoa t nh?t m?t l?n m?t ngy. G?p nha s? ?? khm l?i th??ng xuyn.  Chia s? k? ho?ch qu?n l b?nh ti?u ???ng c?a b?n v?i n?i lm vi?c ho?c tr??ng h?c c?a b?n.  Gi? c?p nh?t v?i vi?c ch?ng ng?a.  H?c qu?n l c?ng th?ng.  Tm ki?m gio d?c v h? tr? v? b?nh ti?u ???ng th??ng xuyn khi c?n.  Tham gia ho?c tm cch ph?c h?i ch?c  n?ng khi c?n thi?t ?? duy tr ho?c c?i thi?n tnh ??c l?p v ch?t l??ng cu?c s?ng. Yu c?u gi?i thi?u v?t l tr? li?u ho?c li?u php ngh? nghi?p n?u b?n b? t bn chn ho?c t tay ho?c nh?ng kh kh?n v?i vi?c ch?i ??u, m?c qu?n o, ?n u?ng ho?c ho?t ??ng th? ch?t. HY THAM V?N V?I CHUYN GIA Y T? N?U:   B?n khng th? ?n th?c ?n ho?c u?ng ?? u?ng trong h?n 6 gi?.  B?n b? bu?n nn v nn m?a trong h?n 6 gi?.  L??ng ???ng huy?t c?a b?n cao trn 240 mg/dL.  C s? thay ??i tr?ng thi tinh th?n.  B?n pht tri?n m?t c?n b?nh nghim tr?ng b? sung.  B?n b? tiu ch?y trong h?n 6 gi?.  B?n ? b? m?t ho?c b?  s?t trong m?t vi ngy v khng kh h?n.  B?n b? ?au trong khi tham gia b?t k? ho?t ??ng th? ch?t no. HY NGAY L?P T?C THAM V?N V?I CHUYN GIA Y T? N?U:   B?n b? kh th?.  B?n c l??ng [place] ? m?c trung bnh ??n cao. ??M B?O B?N:   Hi?u cc h??ng d?n ny.  S? theo di tnh tr?ng c?a mnh.  S? yu c?u tr? gip ngay l?p t?c n?u b?n c?m th?y khng kh?e ho?c tnh tr?ng tr? nn t?i h?n. Document Released: 03/26/2005 Document Revised: 12/19/2011 [facility] Patient Information 2014 [place], [place].

## 2012-09-22 LAB — PAP IG, CT-NG, RFX HPV ASCU
Chlamydia Probe Amp: NEGATIVE
GC Probe Amp: NEGATIVE

## 2013-01-30 ENCOUNTER — Encounter (HOSPITAL_COMMUNITY): Payer: Self-pay | Admitting: Emergency Medicine

## 2013-01-30 ENCOUNTER — Emergency Department (HOSPITAL_COMMUNITY): Payer: BC Managed Care – PPO

## 2013-01-30 ENCOUNTER — Inpatient Hospital Stay (HOSPITAL_COMMUNITY)
Admission: EM | Admit: 2013-01-30 | Discharge: 2013-02-03 | DRG: 065 | Disposition: A | Discharge status: Rehabilitation Facility | Payer: BC Managed Care – PPO | Attending: Family Medicine | Admitting: Family Medicine

## 2013-01-30 ENCOUNTER — Inpatient Hospital Stay (HOSPITAL_COMMUNITY): Payer: BC Managed Care – PPO

## 2013-01-30 DIAGNOSIS — E785 Hyperlipidemia, unspecified: Secondary | ICD-10-CM | POA: Diagnosis present

## 2013-01-30 DIAGNOSIS — E1142 Type 2 diabetes mellitus with diabetic polyneuropathy: Secondary | ICD-10-CM | POA: Diagnosis present

## 2013-01-30 DIAGNOSIS — R209 Unspecified disturbances of skin sensation: Secondary | ICD-10-CM | POA: Diagnosis present

## 2013-01-30 DIAGNOSIS — Z6830 Body mass index (BMI) 30.0-30.9, adult: Secondary | ICD-10-CM

## 2013-01-30 DIAGNOSIS — B1920 Unspecified viral hepatitis C without hepatic coma: Secondary | ICD-10-CM | POA: Diagnosis present

## 2013-01-30 DIAGNOSIS — I10 Essential (primary) hypertension: Secondary | ICD-10-CM | POA: Diagnosis present

## 2013-01-30 DIAGNOSIS — E7800 Pure hypercholesterolemia, unspecified: Secondary | ICD-10-CM | POA: Diagnosis present

## 2013-01-30 DIAGNOSIS — E1149 Type 2 diabetes mellitus with other diabetic neurological complication: Secondary | ICD-10-CM | POA: Diagnosis present

## 2013-01-30 DIAGNOSIS — D751 Secondary polycythemia: Secondary | ICD-10-CM | POA: Diagnosis present

## 2013-01-30 DIAGNOSIS — R7401 Elevation of levels of liver transaminase levels: Secondary | ICD-10-CM

## 2013-01-30 DIAGNOSIS — M6281 Muscle weakness (generalized): Secondary | ICD-10-CM

## 2013-01-30 DIAGNOSIS — Z79899 Other long term (current) drug therapy: Secondary | ICD-10-CM

## 2013-01-30 DIAGNOSIS — G8190 Hemiplegia, unspecified affecting unspecified side: Secondary | ICD-10-CM | POA: Diagnosis present

## 2013-01-30 DIAGNOSIS — I517 Cardiomegaly: Secondary | ICD-10-CM

## 2013-01-30 DIAGNOSIS — H538 Other visual disturbances: Secondary | ICD-10-CM | POA: Diagnosis present

## 2013-01-30 DIAGNOSIS — M7989 Other specified soft tissue disorders: Secondary | ICD-10-CM

## 2013-01-30 DIAGNOSIS — I69998 Other sequelae following unspecified cerebrovascular disease: Secondary | ICD-10-CM | POA: Diagnosis present

## 2013-01-30 DIAGNOSIS — I639 Cerebral infarction, unspecified: Secondary | ICD-10-CM

## 2013-01-30 DIAGNOSIS — R531 Weakness: Secondary | ICD-10-CM

## 2013-01-30 DIAGNOSIS — I6350 Cerebral infarction due to unspecified occlusion or stenosis of unspecified cerebral artery: Principal | ICD-10-CM | POA: Diagnosis present

## 2013-01-30 DIAGNOSIS — E876 Hypokalemia: Secondary | ICD-10-CM | POA: Diagnosis present

## 2013-01-30 DIAGNOSIS — E669 Obesity, unspecified: Secondary | ICD-10-CM | POA: Diagnosis present

## 2013-01-30 DIAGNOSIS — E119 Type 2 diabetes mellitus without complications: Secondary | ICD-10-CM | POA: Diagnosis present

## 2013-01-30 HISTORY — DX: Type 2 diabetes mellitus without complications: E11.9

## 2013-01-30 LAB — CBC
HCT: 43.5 % (ref 36.0–46.0)
Hemoglobin: 15.6 g/dL — ABNORMAL HIGH (ref 12.0–15.0)
MCH: 27.9 pg (ref 26.0–34.0)
MCHC: 35.9 g/dL (ref 30.0–36.0)
MCV: 77.8 fL — ABNORMAL LOW (ref 78.0–100.0)
Platelets: 276 10*3/uL (ref 150–400)
RBC: 5.59 MIL/uL — ABNORMAL HIGH (ref 3.87–5.11)
RDW: 12.8 % (ref 11.5–15.5)
WBC: 7.2 10*3/uL (ref 4.0–10.5)

## 2013-01-30 LAB — GLUCOSE, CAPILLARY
Glucose-Capillary: 283 mg/dL — ABNORMAL HIGH (ref 70–99)
Glucose-Capillary: 276 mg/dL — ABNORMAL HIGH (ref 70–99)
Glucose-Capillary: 209 mg/dL — ABNORMAL HIGH (ref 70–99)
Glucose-Capillary: 252 mg/dL — ABNORMAL HIGH (ref 70–99)

## 2013-01-30 LAB — POCT I-STAT, CHEM 8
BUN: 11 mg/dL (ref 6–23)
Calcium, Ion: 1.12 mmol/L (ref 1.12–1.23)
Chloride: 103 mEq/L (ref 96–112)
Creatinine, Ser: 0.6 mg/dL (ref 0.50–1.10)
Glucose, Bld: 314 mg/dL — ABNORMAL HIGH (ref 70–99)
HCT: 48 % — ABNORMAL HIGH (ref 36.0–46.0)
Hemoglobin: 16.3 g/dL — ABNORMAL HIGH (ref 12.0–15.0)
Potassium: 3.3 mEq/L — ABNORMAL LOW (ref 3.5–5.1)
Sodium: 138 mEq/L (ref 135–145)
TCO2: 21 mmol/L (ref 0–100)

## 2013-01-30 LAB — URINE CULTURE
Colony Count: NO GROWTH
Culture: NO GROWTH

## 2013-01-30 LAB — DIFFERENTIAL
Basophils Absolute: 0 10*3/uL (ref 0.0–0.1)
Basophils Relative: 0 % (ref 0–1)
Eosinophils Absolute: 0.1 10*3/uL (ref 0.0–0.7)
Eosinophils Relative: 1 % (ref 0–5)
Lymphocytes Relative: 28 % (ref 12–46)
Lymphs Abs: 2 10*3/uL (ref 0.7–4.0)
Monocytes Absolute: 0.7 10*3/uL (ref 0.1–1.0)
Monocytes Relative: 10 % (ref 3–12)
Neutro Abs: 4.4 10*3/uL (ref 1.7–7.7)
Neutrophils Relative %: 61 % (ref 43–77)

## 2013-01-30 LAB — HEMOGLOBIN A1C
Hgb A1c MFr Bld: 13.1 % — ABNORMAL HIGH (ref <5.7)
Mean Plasma Glucose: 329 mg/dL — ABNORMAL HIGH (ref <117)

## 2013-01-30 LAB — ETHANOL: Alcohol, Ethyl (B): <11 mg/dL (ref 0–11)

## 2013-01-30 LAB — RAPID URINE DRUG SCREEN, HOSP PERFORMED
Amphetamines: NOT DETECTED
Barbiturates: NOT DETECTED
Benzodiazepines: NOT DETECTED
Cocaine: NOT DETECTED
Opiates: NOT DETECTED
Tetrahydrocannabinol: NOT DETECTED

## 2013-01-30 LAB — POCT I-STAT TROPONIN I: Troponin i, poc: 0.01 ng/mL (ref 0.00–0.08)

## 2013-01-30 LAB — COMPREHENSIVE METABOLIC PANEL
ALT: 59 U/L — ABNORMAL HIGH (ref 0–35)
AST: 31 U/L (ref 0–37)
Albumin: 3.3 g/dL — ABNORMAL LOW (ref 3.5–5.2)
Alkaline Phosphatase: 91 U/L (ref 39–117)
BUN: 12 mg/dL (ref 6–23)
CO2: 21 mEq/L (ref 19–32)
Calcium: 8.7 mg/dL (ref 8.4–10.5)
Chloride: 101 mEq/L (ref 96–112)
Creatinine, Ser: 0.46 mg/dL — ABNORMAL LOW (ref 0.50–1.10)
GFR calc Af Amer: >90 mL/min (ref >90)
GFR calc non Af Amer: >90 mL/min (ref >90)
Glucose, Bld: 312 mg/dL — ABNORMAL HIGH (ref 70–99)
Potassium: 3.4 mEq/L — ABNORMAL LOW (ref 3.5–5.1)
Sodium: 136 mEq/L (ref 135–145)
Total Bilirubin: 0.4 mg/dL (ref 0.3–1.2)
Total Protein: 7.3 g/dL (ref 6.0–8.3)

## 2013-01-30 LAB — URINALYSIS, ROUTINE W REFLEX MICROSCOPIC
Bilirubin Urine: NEGATIVE
Glucose, UA: >1000 mg/dL — AB
Ketones, ur: NEGATIVE mg/dL
Nitrite: NEGATIVE
Protein, ur: NEGATIVE mg/dL
Specific Gravity, Urine: 1.01 (ref 1.005–1.030)
Urobilinogen, UA: 0.2 mg/dL (ref 0.0–1.0)
pH: 7 (ref 5.0–8.0)

## 2013-01-30 LAB — URINE MICROSCOPIC-ADD ON

## 2013-01-30 LAB — TROPONIN I: Troponin I: <0.3 ng/mL (ref <0.30)

## 2013-01-30 LAB — APTT: aPTT: 28 seconds (ref 24–37)

## 2013-01-30 LAB — PROTIME-INR
INR: 0.85 (ref 0.00–1.49)
Prothrombin Time: 11.5 seconds — ABNORMAL LOW (ref 11.6–15.2)

## 2013-01-30 MED ORDER — INSULIN ASPART 100 UNIT/ML ~~LOC~~ SOLN
0.0000 [IU] | Freq: Three times a day (TID) | SUBCUTANEOUS | Status: DC
  Administered 2013-01-30: 5 [IU] via SUBCUTANEOUS
  Administered 2013-01-30: 8 [IU] via SUBCUTANEOUS
  Administered 2013-01-31 (×2): 5 [IU] via SUBCUTANEOUS
  Administered 2013-01-31: 11 [IU] via SUBCUTANEOUS
  Administered 2013-02-01 (×2): 5 [IU] via SUBCUTANEOUS
  Administered 2013-02-01 – 2013-02-02 (×3): 8 [IU] via SUBCUTANEOUS
  Administered 2013-02-03: 3 [IU] via SUBCUTANEOUS
  Administered 2013-02-03 (×2): 5 [IU] via SUBCUTANEOUS

## 2013-01-30 MED ORDER — INFLUENZA VAC SPLIT QUAD 0.5 ML IM SUSP
0.5000 mL | INTRAMUSCULAR | Status: AC
  Administered 2013-02-01: 0.5 mL via INTRAMUSCULAR
  Filled 2013-01-30: qty 0.5

## 2013-01-30 MED ORDER — SODIUM CHLORIDE 0.9 % IJ SOLN
3.0000 mL | Freq: Two times a day (BID) | INTRAMUSCULAR | Status: DC
  Administered 2013-01-30: 3 mL via INTRAVENOUS

## 2013-01-30 MED ORDER — HYDRALAZINE HCL 20 MG/ML IJ SOLN
5.0000 mg | INTRAMUSCULAR | Status: DC | PRN
Start: 2013-01-30 — End: 2013-02-03

## 2013-01-30 MED ORDER — ASPIRIN EC 81 MG PO TBEC
81.0000 mg | DELAYED_RELEASE_TABLET | Freq: Every day | ORAL | Status: DC
  Administered 2013-01-30 – 2013-02-03 (×5): 81 mg via ORAL
  Filled 2013-01-30 (×5): qty 1

## 2013-01-30 MED ORDER — SODIUM CHLORIDE 0.9 % IV SOLN
INTRAVENOUS | Status: DC
  Administered 2013-01-30: 17:00:00 via INTRAVENOUS

## 2013-01-30 MED ORDER — SODIUM CHLORIDE 0.9 % IJ SOLN: 3.0000 mL | INTRAMUSCULAR | Status: DC | PRN

## 2013-01-30 MED ORDER — SODIUM CHLORIDE 0.9 % IJ SOLN
3.0000 mL | Freq: Two times a day (BID) | INTRAMUSCULAR | Status: DC
  Administered 2013-01-30 – 2013-02-02 (×4): 3 mL via INTRAVENOUS

## 2013-01-30 MED ORDER — SODIUM CHLORIDE 0.9 % IV SOLN: 250.0000 mL | INTRAVENOUS | Status: DC | PRN

## 2013-01-30 MED ORDER — ACETAMINOPHEN 325 MG PO TABS
650.0000 mg | ORAL_TABLET | Freq: Four times a day (QID) | ORAL | Status: DC | PRN
Start: 2013-01-30 — End: 2013-02-03
  Administered 2013-02-01 – 2013-02-02 (×3): 650 mg via ORAL
  Filled 2013-01-30 (×3): qty 2

## 2013-01-30 MED ORDER — INSULIN ASPART 100 UNIT/ML ~~LOC~~ SOLN
0.0000 [IU] | Freq: Every day | SUBCUTANEOUS | Status: DC
  Administered 2013-01-30: 3 [IU] via SUBCUTANEOUS

## 2013-01-30 MED ORDER — CYCLOBENZAPRINE HCL 10 MG PO TABS
5.0000 mg | ORAL_TABLET | Freq: Three times a day (TID) | ORAL | Status: DC | PRN
  Administered 2013-01-30 – 2013-02-01 (×3): 5 mg via ORAL
  Filled 2013-01-30 (×3): qty 1

## 2013-01-30 NOTE — ED Notes (Signed)
Admitting MD at bedside.

## 2013-01-30 NOTE — Progress Notes (Signed)
VASCULAR LAB PRELIMINARY  PRELIMINARY  PRELIMINARY  PRELIMINARY  Carotid duplex  completed.    Preliminary report: No significant ICA stenosis noted bilaterally.  Vertebral artery flow antegrade.   Ahjanae Cassel, RVT 01/30/2013, 3:52 PM

## 2013-01-30 NOTE — Progress Notes (Signed)
   CARE MANAGEMENT NOTE 01/30/2013  Patient:  [name],[name]   Account Number:  [id number]  Date Initiated:  01/30/2013  Documentation initiated by:  Jiles Crocker  Subjective/Objective Assessment:   ADMITTED FOR CVA     Action/Plan:   INDEPENDENT PRIOR TO ADMISSION; WORKS FULL TIME; CM FOLLOWING FOR DCP   Anticipated DC Date:  02/03/2013   Anticipated DC Plan:  AWAITING ON PT/OT EVALS;    DC Planning Services  CM consult          Status of service:  In process, will continue to follow Medicare Important Message given?  NA - LOS <3 / Initial given by admissions (If response is "NO", the following Medicare IM given date fields will be blank)  Per UR Regulation:  Reviewed for med. necessity/level of care/duration of stay  Comments:  10/24/2014Abelino Derrick RN,BSN,MHA [phone]

## 2013-01-30 NOTE — Consult Note (Signed)
Referring Physician: ED    Chief Complaint: CODE STROKE: RIGHT HEMIPARESIS-NUMBNESS  HPI:                                                                                                                                         [name] is an 37 y.o. female, right handed, with a past medical history significant for HTN, DM, hypercholesterolemia, brought to [facility] ED by EMS due to acute onset right sided weakness-numbness. Never had similar symptoms before. She was last seen normal by family at 1 am today. Then, she went to the bathroom and complained of having weakness and numbness of the right side. Went to bed by approximately 15 minutes expressed to her family that she needed to go to the hospital. SBP 200 and CBG 307 when first evaluated by EMS. No reported spurred speech, vertigo, double vision, difficulty swallowing, imbalance, confusion, language or vision impairment. She did have a HA at home. Upon arrival to ED initial NIHSS 3. CT brain showed no acute intracranial abnormality.   Date last known well:  Time last known well:  tPA Given: no, late presentation, NIHSS 3 NIHSS: 3 MRS: 1  Past Medical History  Diagnosis Date  . Hypertension   . Hypercholesteremia     Past Surgical History  Procedure Laterality Date  . Cesarean section      No family history on file. Social History:  reports that she has never smoked. She does not have any smokeless tobacco history on file. She reports that she does not drink alcohol or use illicit drugs.  Allergies: No Known Allergies  Medications:                                                                                                                           I have reviewed the patient's current medications.  ROS:  History obtained from family  General ROS: negative for - chills, fatigue, fever, night  sweats, weight gain or weight loss Psychological ROS: negative for - behavioral disorder, hallucinations, memory difficulties, mood swings or suicidal ideation Ophthalmic ROS: negative for - blurry vision, double vision, eye pain or loss of vision ENT ROS: negative for - epistaxis, nasal discharge, oral lesions, sore throat, tinnitus or vertigo Allergy and Immunology ROS: negative for - hives or itchy/watery eyes Hematological and Lymphatic ROS: negative for - bleeding problems, bruising or swollen lymph nodes Endocrine ROS: negative for - galactorrhea, hair pattern changes, polydipsia/polyuria or temperature intolerance Respiratory ROS: negative for - cough, hemoptysis, shortness of breath or wheezing Cardiovascular ROS: negative for - chest pain, dyspnea on exertion, edema or irregular heartbeat Gastrointestinal ROS: negative for - abdominal pain, diarrhea, hematemesis, nausea/vomiting or stool incontinence Genito-Urinary ROS: negative for - dysuria, hematuria, incontinence or urinary frequency/urgency Musculoskeletal ROS: negative for - joint swelling Neurological ROS: as noted in HPI Dermatological ROS: negative for rash and skin lesion changes   Physical exam: pleasant female in no apparent distress. BP 199/90 P 82 R 16 Afebrile. Head: normocephalic. Neck: supple, no bruits, no JVD. Cardiac: no murmurs. Lungs: clear. Abdomen: soft, no tender, no mass. Extremities: no edema.  Neurologic Examination:                                                                                                      Mental Status: Alert, awake,oriented, thought content appropriate.  Speech fluent without evidence of aphasia.  Able to follow 3 step commands without difficulty. Cranial Nerves: II: Discs flat bilaterally; Visual fields grossly normal, pupils equal, round, reactive to light and accommodation III,IV, VI: ptosis not present, extra-ocular motions intact bilaterally V,VII: smile symmetric,  facial light touch sensation normal bilaterally VIII: hearing normal bilaterally IX,X: gag reflex present XI: bilateral shoulder shrug XII: midline tongue extension without atrophy or fasciculations  Motor: Right sided weakness but some inconsistencies on exam. Tone and bulk:normal tone throughout; no atrophy noted Sensory: Pinprick and light touch intact diminished on the right side. Deep Tendon Reflexes:  1+ all over Plantars: Right: downgoing   Left: downgoing Cerebellar: normal finger-to-nose and normal heel-to-shin test in the left. Poor effort and questionable weakness in the right precludes testing. Gait: No ataxia. CV: pulses palpable throughout    Results for orders placed during the hospital encounter of 01/30/13 (from the past 48 hour(s))  POCT I-STAT, CHEM 8     Status: Abnormal   Collection Time    01/30/13  6:44 AM      Result Value Range   Sodium 138  135 - 145 mEq/L   Potassium 3.3 (*) 3.5 - 5.1 mEq/L   Chloride 103  96 - 112 mEq/L   BUN 11  6 - 23 mg/dL   Creatinine, Ser 4.78  0.50 - 1.10 mg/dL   Glucose, Bld 295 (*) 70 - 99 mg/dL   Calcium, Ion 6.21  3.08 - 1.23 mmol/L   TCO2 21  0 - 100 mmol/L   Hemoglobin 16.3 (*) 12.0 - 15.0  g/dL   HCT 16.1 (*) 09.6 - 04.5 %   Ct Head Wo Contrast  01/30/2013   CLINICAL DATA:  PET showed. Acute onset of left-sided weakness and numbness in the face.  EXAM: CT HEAD WITHOUT CONTRAST  TECHNIQUE: Contiguous axial images were obtained from the base of the skull through the vertex without intravenous contrast.  COMPARISON:  None.  FINDINGS: The ventricles and sulci appear symmetrical. Calcifications in the basal ganglia. No mass effect or midline shift. No abnormal extra-axial fluid collections. Gray-white matter junctions are distinct. Basal cisterns are not effaced. No evidence of acute intracranial hemorrhage. No depressed skull fractures. Mucosal thickening in the left maxillary antrum. Paranasal sinuses and mastoid air cells  are otherwise not opacified.  IMPRESSION: No acute intracranial abnormalities.  Dose stroke results were telephoned to [place], [place] for Dr. Dierdre Highman at [phone] hr on 01/30/2013.   Electronically Signed   By: Burman Nieves M.D.   On: 01/30/2013 06:52      Assessment: 37 y.o. female with acute onset right hemiparesis-numbness and unremarkable CT brain. NIHSS 3 but some inconsistencies on testing. Out of the window for IV thrombolysis. Some functional findings on exam but she has risk factors for stroke and thus can not entirely exclude the possibility of left brain stroke. Admit to medicine. Start aspirin. Complete stroke work up.   Stroke Risk Factors - HTN, DM, hypercholesterolemia.  Plan: 1. HgbA1c, fasting lipid panel 2. MRI, MRA  of the brain without contrast 3. Echocardiogram 4. Carotid dopplers 5. Prophylactic therapy-aspirin 81 mg daily. 6. Risk factor modification 7. Telemetry monitoring 8. Frequent neuro checks 9. PT/OT SLP   Wyatt Portela, MD [facility] [phone]  01/30/2013, 6:55 AM

## 2013-01-30 NOTE — H&P (Signed)
Family Medicine Teaching [facility] Admission History and Physical Service Pager: [phone]  Patient name: [name] Medical record number: [id number] Date of birth: 07-16-1975 Age: 37 y.o. Gender: female  Primary Care Provider: No PCP Per Patient Consultants: Neuro Code Status: Full  Chief Complaint: R sided weakness and numbness  Assessment and Plan: [name] is a 37 y.o. female presenting with right sided numbness and weakness. PMH is significant for recently diagnosed HTN and DM (not currently treated.)  # Right sided numbness and weakness- Called as Code Stroke on arrival to the ED. Neurology has evaluated patient. She is outside TPA window as last normal activity 01:00. CT unremarkable. Currently continues to have symptoms. - Admit to Telemetry, Attending Dr. Gwendolyn Grant - Carotid copplers, MRI/MRA - Lipids, HgbA1c - Echo - PT/OT - Neuro checks on floor - ASA 81mg  daily - SLP if fails swallow screen - Keep head of bed flat - Allow permissive HTN for now until stroke has been fully ruled out  # HTN- Patient is not taking medications as an outpatient because of how it made her feel. BP has been 170's/110's since arrival to the ED.  - Allow permissive HTN in setting of possible stroke - Hydralazine 5mg  prn for SBP >185 or DBP >115 - Monitor vitals per floor protocol - Restart Lisinopril once stroke has been fully evaluated  # DM- Patient diagnosed a few months ago, but does not take medication. - Last A1c >11, repeat labs pending - Moderate SSI - Hold metformin while inpatient, but restart at discharge  FEN/GI: NPO until passes swallow screen, then advance to carb modified Prophylaxis: SCD  Disposition: Pending further work up and evaluation  History of Present Illness: [name] is a 37 y.o. female presenting with right sided numbness and weakness. She is accompanied by her brother, who she lives with. He states she went to bed at 1am and woke up at 4am to use the rest  room. She was able to ambulate to the bathroom but called out for him due to not feeling strength in R UE and LE, and her R face numb. Associated w/ HA at that time, which has since resolved. Denies palpitations, CP, SOB, syncope. Able to speak appropriately. No symptoms similar to this ever before. Diagnosed with high blood pressure and DM in June of this year, but stoped taking lisinopril/HCTZ and Metformin several weeks after they were prescribed due to feelings of nausea and fatigue.  Currently, she endorses numbness and weakness. She also states she feels dizzy, which is worse when her eyes are open (goes away with her eyes closed).  Review Of Systems: Per HPI. Otherwise 12 point review of systems was performed and was unremarkable.  Patient Active Problem List   Diagnosis Date Noted  . Right sided weakness 01/30/2013  . HTN (hypertension) 01/30/2013  . DM (diabetes mellitus) 01/30/2013   Past Medical History: Past Medical History  Diagnosis Date  . Hypertension   . Hypercholesteremia   . Diabetes mellitus without complication    Past Surgical History: Past Surgical History  Procedure Laterality Date  . Cesarean section     Social History: History  Substance Use Topics  . Smoking status: Never Smoker   . Smokeless tobacco: Not on file  . Alcohol Use: No   Additional social history: Limited english. Lives with brother.  Please also refer to relevant sections of EMR.  Family History: No family history on file. Allergies and Medications: No Known Allergies No current facility-administered medications  on file prior to encounter.   No current outpatient prescriptions on file prior to encounter.   Objective: BP 177/119  Pulse 102  Temp(Src) 98.5 F (36.9 C) (Oral)  Resp 23  Ht 5' (1.524 m)  Wt 155 lb (70.308 kg)  BMI 30.27 kg/m2  SpO2 97% Exam: General: NAD, reclined in stretcher. Appears comfortable. HEENT: EOMI, pupils equal round and reactive.  MMM. Cardiovascular: RRR, II/VI systolic murmur Respiratory: CTAB, nml effot Abdomen: NABS, soft non-ttp Extremities: no edema, warm and well perfused Skin: intact Neuro: Per Dr. Satira Sark initial exam: mild facial assymetry w/ smile, no tongue deviation, weakness w/ head turning to the R and shrugging of shoulders. R arm and grip strength 2/5 and RLL 2/5 w/ L side UE, grip and LE strength preserved. R facial assymetry w/ numbness to face.   On my neuro exam:  - Normal speech in native language. Very mild asymmetry with smile with very small droop on the right. No tongue deviation, weakness or obvious abnormalities with CN testing other than report of decreased sensation on the right.  - RUE and LUE with preserved strength and tone. Some inconsistencies with participation. Patient able to lift right arm but flops back down, however, will lift and hold right arm with her eyes closed.  - Able to lift right leg against gravity, but does not hold as long as left.  - Finger to nose wnl.  - Able to stand with assistance, but flops right side. Held her right leg up off the ground, but places it on the ground and bears weight on command. Able to put one foot in front of the other, but is weak. Pushed off right foot when getting into bed, but then difficulty getting right leg back in bed.  Labs and Imaging: Results for orders placed during the hospital encounter of 01/30/13 (from the past 24 hour(s))  ETHANOL     Status: None   Collection Time    01/30/13  6:40 AM      Result Value Range   Alcohol, Ethyl (B) <11  0 - 11 mg/dL  PROTIME-INR     Status: Abnormal   Collection Time    01/30/13  6:40 AM      Result Value Range   Prothrombin Time 11.5 (*) 11.6 - 15.2 seconds   INR 0.85  0.00 - 1.49  APTT     Status: None   Collection Time    01/30/13  6:40 AM      Result Value Range   aPTT 28  24 - 37 seconds  CBC     Status: Abnormal   Collection Time    01/30/13  6:40 AM      Result Value Range    WBC 7.2  4.0 - 10.5 K/uL   RBC 5.59 (*) 3.87 - 5.11 MIL/uL   Hemoglobin 15.6 (*) 12.0 - 15.0 g/dL   HCT 01.0  27.2 - 53.6 %   MCV 77.8 (*) 78.0 - 100.0 fL   MCH 27.9  26.0 - 34.0 pg   MCHC 35.9  30.0 - 36.0 g/dL   RDW 64.4  03.4 - 74.2 %   Platelets 276  150 - 400 K/uL  DIFFERENTIAL     Status: None   Collection Time    01/30/13  6:40 AM      Result Value Range   Neutrophils Relative % 61  43 - 77 %   Neutro Abs 4.4  1.7 - 7.7 K/uL  Lymphocytes Relative 28  12 - 46 %   Lymphs Abs 2.0  0.7 - 4.0 K/uL   Monocytes Relative 10  3 - 12 %   Monocytes Absolute 0.7  0.1 - 1.0 K/uL   Eosinophils Relative 1  0 - 5 %   Eosinophils Absolute 0.1  0.0 - 0.7 K/uL   Basophils Relative 0  0 - 1 %   Basophils Absolute 0.0  0.0 - 0.1 K/uL  COMPREHENSIVE METABOLIC PANEL     Status: Abnormal   Collection Time    01/30/13  6:40 AM      Result Value Range   Sodium 136  135 - 145 mEq/L   Potassium 3.4 (*) 3.5 - 5.1 mEq/L   Chloride 101  96 - 112 mEq/L   CO2 21  19 - 32 mEq/L   Glucose, Bld 312 (*) 70 - 99 mg/dL   BUN 12  6 - 23 mg/dL   Creatinine, Ser 1.61 (*) 0.50 - 1.10 mg/dL   Calcium 8.7  8.4 - 09.6 mg/dL   Total Protein 7.3  6.0 - 8.3 g/dL   Albumin 3.3 (*) 3.5 - 5.2 g/dL   AST 31  0 - 37 U/L   ALT 59 (*) 0 - 35 U/L   Alkaline Phosphatase 91  39 - 117 U/L   Total Bilirubin 0.4  0.3 - 1.2 mg/dL   GFR calc non Af Amer >90  >90 mL/min   GFR calc Af Amer >90  >90 mL/min  TROPONIN I     Status: None   Collection Time    01/30/13  6:40 AM      Result Value Range   Troponin I <0.30  <0.30 ng/mL  POCT I-STAT TROPONIN I     Status: None   Collection Time    01/30/13  6:42 AM      Result Value Range   Troponin i, poc 0.01  0.00 - 0.08 ng/mL   Comment 3           POCT I-STAT, CHEM 8     Status: Abnormal   Collection Time    01/30/13  6:44 AM      Result Value Range   Sodium 138  135 - 145 mEq/L   Potassium 3.3 (*) 3.5 - 5.1 mEq/L   Chloride 103  96 - 112 mEq/L   BUN 11  6 - 23  mg/dL   Creatinine, Ser 0.45  0.50 - 1.10 mg/dL   Glucose, Bld 409 (*) 70 - 99 mg/dL   Calcium, Ion 8.11  9.14 - 1.23 mmol/L   TCO2 21  0 - 100 mmol/L   Hemoglobin 16.3 (*) 12.0 - 15.0 g/dL   HCT 78.2 (*) 95.6 - 21.3 %  GLUCOSE, CAPILLARY     Status: Abnormal   Collection Time    01/30/13  7:12 AM      Result Value Range   Glucose-Capillary 283 (*) 70 - 99 mg/dL   Comment 1 Documented in Chart     Comment 2 Notify RN    URINE RAPID DRUG SCREEN (HOSP PERFORMED)     Status: None   Collection Time    01/30/13  7:54 AM      Result Value Range   Opiates NONE DETECTED  NONE DETECTED   Cocaine NONE DETECTED  NONE DETECTED   Benzodiazepines NONE DETECTED  NONE DETECTED   Amphetamines NONE DETECTED  NONE DETECTED   Tetrahydrocannabinol NONE DETECTED  NONE DETECTED  Barbiturates NONE DETECTED  NONE DETECTED  URINALYSIS, ROUTINE W REFLEX MICROSCOPIC     Status: Abnormal   Collection Time    01/30/13  7:54 AM      Result Value Range   Color, Urine YELLOW  YELLOW   APPearance HAZY (*) CLEAR   Specific Gravity, Urine 1.010  1.005 - 1.030   pH 7.0  5.0 - 8.0   Glucose, UA >1000 (*) NEGATIVE mg/dL   Hgb urine dipstick MODERATE (*) NEGATIVE   Bilirubin Urine NEGATIVE  NEGATIVE   Ketones, ur NEGATIVE  NEGATIVE mg/dL   Protein, ur NEGATIVE  NEGATIVE mg/dL   Urobilinogen, UA 0.2  0.0 - 1.0 mg/dL   Nitrite NEGATIVE  NEGATIVE   Leukocytes, UA MODERATE (*) NEGATIVE  URINE MICROSCOPIC-ADD ON     Status: Abnormal   Collection Time    01/30/13  7:54 AM      Result Value Range   Squamous Epithelial / LPF FEW (*) RARE   WBC, UA 21-50  <3 WBC/hpf   RBC / HPF 3-6  <3 RBC/hpf   Bacteria, UA FEW (*) RARE   Urine-Other RARE YEAST     Ct Head Wo Contrast  01/30/2013   CLINICAL DATA:  PET showed. Acute onset of left-sided weakness and numbness in the face.  EXAM: CT HEAD WITHOUT CONTRAST  TECHNIQUE: Contiguous axial images were obtained from the base of the skull through the vertex without  intravenous contrast.  COMPARISON:  None.  FINDINGS: The ventricles and sulci appear symmetrical. Calcifications in the basal ganglia. No mass effect or midline shift. No abnormal extra-axial fluid collections. Gray-white matter junctions are distinct. Basal cisterns are not effaced. No evidence of acute intracranial hemorrhage. No depressed skull fractures. Mucosal thickening in the left maxillary antrum. Paranasal sinuses and mastoid air cells are otherwise not opacified.  IMPRESSION: No acute intracranial abnormalities.  Dose stroke results were telephoned to [place], [place] for Dr. Dierdre Highman at [phone] hr on 01/30/2013.   Electronically Signed   By: Burman Nieves M.D.   On: 01/30/2013 06:52    Elfreida Heggs Nydia Bouton, MD 01/30/2013, 9:27 AM PGY-3, [facility] FPTS Intern pager: [phone], text pages welcome

## 2013-01-30 NOTE — Progress Notes (Signed)
  Echocardiogram 2D Echocardiogram has been performed.  [name] 01/30/2013, 3:15 PM

## 2013-01-30 NOTE — ED Provider Notes (Signed)
CSN: [id number]     Arrival date & time 01/30/13  4098 History   First MD Initiated Contact with Patient 01/30/13 [phone]     No chief complaint on file.  (Consider location/radiation/quality/duration/timing/severity/associated sxs/prior Treatment) HPI HX per EMS and patients father - PT does not speak [place].  LSW around 1am, today with waking up has R sided weakness to her arm and leg, unable to sit up/ hold herself up. No facial droop. Is treated for HTn and DM, no h/o CVA. BIB EMS, blood sugar over 300. No Sz activity reported. Code stroke called in route. On arrival to the ED, airway intact, persistent deficits, sent to CT for stat imaging of brain. PT able to follow limited commands, unable to provide history Past Medical History  Diagnosis Date  . Hypertension   . Hypercholesteremia    Past Surgical History  Procedure Laterality Date  . Cesarean section     No family history on file. History  Substance Use Topics  . Smoking status: Never Smoker   . Smokeless tobacco: Not on file  . Alcohol Use: No   OB History   Grav Para Term Preterm Abortions TAB SAB Ect Mult Living                 Review of Systems  Unable to perform ROS level 5 caveat applies  Allergies  Review of patient's allergies indicates no known allergies.  Home Medications   Current Outpatient Rx  Name  Route  Sig  Dispense  Refill  . lisinopril-hydrochlorothiazide (PRINZIDE,ZESTORETIC) 20-25 MG per tablet   Oral   Take 1 tablet by mouth daily.   30 tablet   1   . metFORMIN (GLUCOPHAGE XR) 500 MG 24 hr tablet      Take one daily for one week, then 2 for one week, 3 for one week and finally 4 daily   120 tablet   5   . metroNIDAZOLE (FLAGYL) 500 MG tablet   Oral   Take 1 tablet (500 mg total) by mouth 2 (two) times daily with a meal. DO NOT CONSUME ALCOHOL WHILE TAKING THIS MEDICATION.   14 tablet   0    There were no vitals taken for this visit. Physical Exam  Constitutional: She appears  well-developed and well-nourished.  HENT:  Head: Normocephalic and atraumatic.  Eyes: EOM are normal. Pupils are equal, round, and reactive to light.  Neck: Neck supple.  Cardiovascular: Normal rate, regular rhythm and intact distal pulses.   Pulmonary/Chest: Effort normal and breath sounds normal. No respiratory distress.  Abdominal: Soft. She exhibits no distension. There is no tenderness.  Musculoskeletal: She exhibits no edema and no tenderness.  Neurological: She is alert.  Awake, able to move L arm and leg and follow limited commands, flaccid paralysis of RUE and RLE, no facial droop.   Skin: Skin is warm and dry.    ED Course  Procedures (including critical care time) Labs Review Labs Reviewed  PROTIME-INR - Abnormal; Notable for the following:    Prothrombin Time 11.5 (*)    All other components within normal limits  CBC - Abnormal; Notable for the following:    RBC 5.59 (*)    Hemoglobin 15.6 (*)    MCV 77.8 (*)    All other components within normal limits  COMPREHENSIVE METABOLIC PANEL - Abnormal; Notable for the following:    Potassium 3.4 (*)    Glucose, Bld 312 (*)    Creatinine, Ser 0.46 (*)  Albumin 3.3 (*)    ALT 59 (*)    All other components within normal limits  GLUCOSE, CAPILLARY - Abnormal; Notable for the following:    Glucose-Capillary 283 (*)    All other components within normal limits  POCT I-STAT, CHEM 8 - Abnormal; Notable for the following:    Potassium 3.3 (*)    Glucose, Bld 314 (*)    Hemoglobin 16.3 (*)    HCT 48.0 (*)    All other components within normal limits  ETHANOL  APTT  DIFFERENTIAL  TROPONIN I  URINE RAPID DRUG SCREEN (HOSP PERFORMED)  URINALYSIS, ROUTINE W REFLEX MICROSCOPIC  HEMOGLOBIN A1C  POCT I-STAT TROPONIN I   Imaging Review Ct Head Wo Contrast  01/30/2013   CLINICAL DATA:  PET showed. Acute onset of left-sided weakness and numbness in the face.  EXAM: CT HEAD WITHOUT CONTRAST  TECHNIQUE: Contiguous axial images  were obtained from the base of the skull through the vertex without intravenous contrast.  COMPARISON:  None.  FINDINGS: The ventricles and sulci appear symmetrical. Calcifications in the basal ganglia. No mass effect or midline shift. No abnormal extra-axial fluid collections. Gray-white matter junctions are distinct. Basal cisterns are not effaced. No evidence of acute intracranial hemorrhage. No depressed skull fractures. Mucosal thickening in the left maxillary antrum. Paranasal sinuses and mastoid air cells are otherwise not opacified.  IMPRESSION: No acute intracranial abnormalities.  Dose stroke results were telephoned to [place], [place] for Dr. Dierdre Highman at [phone] hr on 01/30/2013.   Electronically Signed   By: Burman Nieves M.D.   On: 01/30/2013 06:52    Date: 01/30/2013  Rate: 102  Rhythm: sinus tachycardia  QRS Axis: normal  Intervals: normal  ST/T Wave abnormalities: nonspecific ST changes  Conduction Disutrbances:none  Narrative Interpretation:   Old EKG Reviewed: NSR otherwise no sig changes  NEU Dr Leroy Kennedy bedside on PT arrival to ED  NIHSS 3, NEU bedside recs admit MED for MRI, recs no tPA.   7:34 AM d/w FPM - will admit MDM   Dx: CVA CT ECG Labs NEU consult MED admit    Sunnie Nielsen, MD 01/30/13 [id number]

## 2013-01-30 NOTE — ED Notes (Signed)
Per significant other:  pt. Woke up 0400 with rt. Sided weakness and numbness, and facial rt. Sided facial droop.

## 2013-01-30 NOTE — H&P (Signed)
FMTS Attending Daily Note:  Renold Don MD  [phone] pager  Family Practice pager:  [phone] I have seen and examined this patient and have reviewed their chart. I have discussed this patient with the resident. I agree with the resident's findings, assessment and care plan.  Briefly, 37 yo F with PMH for DM2 and HTN presenting with 1 day Right sided numbness and weakness.  Presented to [facility] ED, diagnosed as Code Stroke but outside TPA window.  Evaluated by neurology.  FTPS called for admission.  Exam: Gen:  Female appears stated age, lying in hospital bed, NAD HEENT:  [place]/AT, MMM Heart:  RRR Lungs:  CLear Neuro:  Alert and oriented.  I do not note any further facial asymmetry.  CN II - XII intact.  Strength 3/5 right handgrip, RIght arm.  Strength 5/5 LUE.  Strength 3/5 RLE, 5/5 plantar flexion, 4/5 knee extension and flexion.    IMp/Plan: 1. Right sided hemiparesis:  - presurmed stroke - Awaiting MRI/MRA head and neck - further stroke wu pending - agree with ASA, SLP, permissive HTN  2.  HTN:  - agree with permissive HTN  3.  DM2:   - Agree with SSI - uncontrolled at home.  A1C 13.    4.  Hypokalemia: - replete K+  5.  Bacteriuria:   -awaiting culture.  No sx's  6.  Heme: - Elevated Hgb, possibly hemoconcentrated.   - replete fluids while awaiting speech  - microcytosis noted, Asian descent, possibility of thalassemia but would suspect anemia with this.   Dispo:  Pending further wu and PT rec's   Tobey Grim, MD 01/30/2013 3:56 PM

## 2013-01-30 NOTE — ED Notes (Signed)
Report received, assumed care.  

## 2013-01-30 NOTE — Progress Notes (Signed)
PT Cancellation Note  Patient Details Name: [name] MRN: [id number] DOB: [birth year]   Cancelled Treatment:    Reason Eval/Treat Not Completed: Patient not medically ready (pt on bedrest for 12 hours until 7:37 pm )   Fabio Asa 01/30/2013, 1:00 PM Charlotte Crumb, PT DPT  [phone]

## 2013-01-31 DIAGNOSIS — I6350 Cerebral infarction due to unspecified occlusion or stenosis of unspecified cerebral artery: Principal | ICD-10-CM

## 2013-01-31 DIAGNOSIS — I639 Cerebral infarction, unspecified: Secondary | ICD-10-CM

## 2013-01-31 DIAGNOSIS — E876 Hypokalemia: Secondary | ICD-10-CM

## 2013-01-31 HISTORY — DX: Cerebral infarction, unspecified: I63.9

## 2013-01-31 LAB — CBC
HCT: 43.9 % (ref 36.0–46.0)
Hemoglobin: 15.8 g/dL — ABNORMAL HIGH (ref 12.0–15.0)
MCH: 28.1 pg (ref 26.0–34.0)
MCHC: 36 g/dL (ref 30.0–36.0)
MCV: 78.1 fL (ref 78.0–100.0)
Platelets: 284 10*3/uL (ref 150–400)
RBC: 5.62 MIL/uL — ABNORMAL HIGH (ref 3.87–5.11)
RDW: 13 % (ref 11.5–15.5)
WBC: 9.9 10*3/uL (ref 4.0–10.5)

## 2013-01-31 LAB — BASIC METABOLIC PANEL
BUN: 16 mg/dL (ref 6–23)
CO2: 21 mEq/L (ref 19–32)
Calcium: 8.7 mg/dL (ref 8.4–10.5)
Chloride: 101 mEq/L (ref 96–112)
Creatinine, Ser: 0.49 mg/dL — ABNORMAL LOW (ref 0.50–1.10)
GFR calc Af Amer: >90 mL/min (ref >90)
GFR calc non Af Amer: >90 mL/min (ref >90)
Glucose, Bld: 214 mg/dL — ABNORMAL HIGH (ref 70–99)
Potassium: 3 mEq/L — ABNORMAL LOW (ref 3.5–5.1)
Sodium: 136 mEq/L (ref 135–145)

## 2013-01-31 LAB — GLUCOSE, CAPILLARY
Glucose-Capillary: 216 mg/dL — ABNORMAL HIGH (ref 70–99)
Glucose-Capillary: 312 mg/dL — ABNORMAL HIGH (ref 70–99)
Glucose-Capillary: 241 mg/dL — ABNORMAL HIGH (ref 70–99)
Glucose-Capillary: 227 mg/dL — ABNORMAL HIGH (ref 70–99)

## 2013-01-31 LAB — LIPID PANEL
Cholesterol: 240 mg/dL — ABNORMAL HIGH (ref 0–200)
HDL: 56 mg/dL (ref >39)
LDL Cholesterol: 158 mg/dL — ABNORMAL HIGH (ref 0–99)
Total CHOL/HDL Ratio: 4.3 RATIO
Triglycerides: 129 mg/dL (ref <150)
VLDL: 26 mg/dL (ref 0–40)

## 2013-01-31 MED ORDER — ATORVASTATIN CALCIUM 20 MG PO TABS: 20.0000 mg | ORAL_TABLET | Freq: Every day | ORAL | Status: DC

## 2013-01-31 MED ORDER — POTASSIUM CHLORIDE CRYS ER 20 MEQ PO TBCR
40.0000 meq | EXTENDED_RELEASE_TABLET | ORAL | Status: AC
  Administered 2013-01-31 (×2): 40 meq via ORAL
  Filled 2013-01-31 (×2): qty 2

## 2013-01-31 MED ORDER — INSULIN GLARGINE 100 UNIT/ML ~~LOC~~ SOLN
10.0000 [IU] | Freq: Every day | SUBCUTANEOUS | Status: DC
  Administered 2013-01-31 – 2013-02-03 (×3): 10 [IU] via SUBCUTANEOUS
  Filled 2013-01-31 (×4): qty 0.1

## 2013-01-31 MED ORDER — LIVING WELL WITH DIABETES BOOK
Freq: Once | Status: AC
  Administered 2013-01-31: 13:00:00
  Filled 2013-01-31: qty 1

## 2013-01-31 NOTE — Progress Notes (Signed)
Rehab Admissions Coordinator Note:  Patient was screened by Clois Dupes for appropriateness for an Inpatient Acute Rehab Consult.  At this time, we await inpt rehab consult completion on Monday.  Clois Dupes 01/31/2013, 2:30 PM  I can be reached at [phone].

## 2013-01-31 NOTE — Progress Notes (Signed)
Stroke Team Progress Note  HISTORY [name] is a 37 y.o. female, right handed, with a past medical history significant for HTN, DM, and hypercholesterolemia, brought to [facility] ED 01/30/2013 by EMS due to acute onset right sided weakness-numbness. Never had similar symptoms before. She was last seen normal by family at 1 am that day. Then, she went to the bathroom and complained of having weakness and numbness of the right side. Went to bed by approximately 15 minutes expressed to her family that she needed to go to the hospital. SBP 200 and CBG 307 when first evaluated by EMS. No reported spurred speech, vertigo, double vision, difficulty swallowing, imbalance, confusion, language or vision impairment. She did have a HA at home. Upon arrival to ED initial NIHSS 3. CT brain showed no acute intracranial abnormality.   Date last known well:  Time last known well:  tPA Given: no, late presentation, NIHSS 3  NIHSS: 3  MRS: 1   SUBJECTIVE Multiple family members present. The patient speaks limited [place]. Her brother acted as a Nurse, learning disability. The patient still feels weak and numb on the right side of her body.  OBJECTIVE Most recent Vital Signs: Filed Vitals:   01/30/13 2000 01/30/13 2200 01/31/13 0115 01/31/13 0529  BP: 156/100 150/102 144/93 153/99  Pulse: 100 102 89 93  Temp: 98.2 F (36.8 C) 98 F (36.7 C) 98.4 F (36.9 C) 99 F (37.2 C)  TempSrc: Oral Oral Oral Oral  Resp: 20 20 20 20   Height:      Weight:      SpO2: 98% 97% 100% 98%   CBG (last 3)   Recent Labs  01/30/13 1555 01/30/13 2134 01/31/13 0638  GLUCAP 209* 252* 216*    IV Fluid Intake:   . sodium chloride 100 mL/hr at 01/30/13 1652    MEDICATIONS  . aspirin EC  81 mg Oral Daily  . influenza vac split quadrivalent PF  0.5 mL Intramuscular Tomorrow-1000  . insulin aspart  0-15 Units Subcutaneous TID WC  . insulin aspart  0-5 Units Subcutaneous QHS  . sodium chloride  3 mL Intravenous Q12H  . sodium chloride  3 mL  Intravenous Q12H   PRN:  sodium chloride, acetaminophen, cyclobenzaprine, hydrALAZINE, sodium chloride  Diet:  Carb Control thin liquids Activity: Up with assistance DVT Prophylaxis:  SCDs  CLINICALLY SIGNIFICANT STUDIES Basic Metabolic Panel:   Recent Labs Lab 01/30/13 0640 01/30/13 0644 01/31/13 0545  NA 136 138 136  K 3.4* 3.3* 3.0*  CL 101 103 101  CO2 21  --  21  GLUCOSE 312* 314* 214*  BUN 12 11 16   CREATININE 0.46* 0.60 0.49*  CALCIUM 8.7  --  8.7   Liver Function Tests:   Recent Labs Lab 01/30/13 0640  AST 31  ALT 59*  ALKPHOS 91  BILITOT 0.4  PROT 7.3  ALBUMIN 3.3*   CBC:   Recent Labs Lab 01/30/13 0640 01/30/13 0644 01/31/13 0545  WBC 7.2  --  9.9  NEUTROABS 4.4  --   --   HGB 15.6* 16.3* 15.8*  HCT 43.5 48.0* 43.9  MCV 77.8*  --  78.1  PLT 276  --  284   Coagulation:   Recent Labs Lab 01/30/13 0640  LABPROT 11.5*  INR 0.85   Cardiac Enzymes:   Recent Labs Lab 01/30/13 0640  TROPONINI <0.30   Urinalysis:   Recent Labs Lab 01/30/13 0754  COLORURINE YELLOW  LABSPEC 1.010  PHURINE 7.0  GLUCOSEU >1000*  HGBUR  MODERATE*  BILIRUBINUR NEGATIVE  KETONESUR NEGATIVE  PROTEINUR NEGATIVE  UROBILINOGEN 0.2  NITRITE NEGATIVE  LEUKOCYTESUR MODERATE*   Lipid Panel    Component Value Date/Time   CHOL 240* 01/31/2013 0545   TRIG 129 01/31/2013 0545   HDL 56 01/31/2013 0545   CHOLHDL 4.3 01/31/2013 0545   VLDL 26 01/31/2013 0545   LDLCALC 158* 01/31/2013 0545   HgbA1C  Lab Results  Component Value Date   HGBA1C 13.1* 01/30/2013    Urine Drug Screen:     Component Value Date/Time   LABOPIA NONE DETECTED 01/30/2013 0754   COCAINSCRNUR NONE DETECTED 01/30/2013 0754   LABBENZ NONE DETECTED 01/30/2013 0754   AMPHETMU NONE DETECTED 01/30/2013 0754   THCU NONE DETECTED 01/30/2013 0754   LABBARB NONE DETECTED 01/30/2013 0754    Alcohol Level:   Recent Labs Lab 01/30/13 0640  ETH <11    Ct Head Wo Contrast 01/30/2013     No acute intracranial abnormalities.   MRI Head Wo Contrast 01/30/2013    1. Acute nonhemorrhagic linear infarcts involving the lateral left thalamus and potentially internal capsule. The maximal length is 12 mm. 2. Scattered subcortical T2 hyperintensities are slightly greater than expected for age. 3. Minimal sinus disease.    MRA Head Wo Contrast 01/30/2013    Normal MRA circle of Willis without evidence for significant proximal stenosis, aneurysm, or branch vessel occlusion.    2D Echocardiogram  - ejection fraction 60-65%. No cardiac source of emboli was identified.  Carotid Doppler  Preliminary report: No significant ICA stenosis noted bilaterally. Vertebral artery flow antegrade.  CXR    EKG pending  Therapy Recommendations - pending  Physical Exam    Neurological Examination  Mental Status:  Alert, awake,oriented, thought content appropriate. Speech fluent without evidence of aphasia. Able to follow 3 step commands without difficulty.  Cranial Nerves:  II: Discs flat bilaterally; Visual fields grossly normal, pupils equal, round, reactive to light and accommodation  III,IV, VI: ptosis not present, extra-ocular motions intact bilaterally  V,VII: smile symmetric, facial light touch sensation normal bilaterally  VIII: hearing normal bilaterally  IX,X: gag reflex present  XI: bilateral shoulder shrug  XII: midline tongue extension without atrophy or fasciculations  Motor:  Right sided weakness but some inconsistencies on exam. 4/5 right upper and lower extremities. 5/5 on left. Tone and bulk:normal tone throughout; no atrophy noted  Sensory: Pinprick and light touch intact diminished on the right side.  Deep Tendon Reflexes:  1+ all over  Plantars:  Right: downgoing Left: downgoing  Cerebellar:  normal finger-to-nose and normal heel-to-shin test in the left. Poor effort and questionable weakness in the right precludes testing.  Gait:  No ataxia.  CV: pulses  palpable throughout     ASSESSMENT Ms. Everline Mahaffy is a 37 y.o. female presenting with right hemiparesis and hemisensory deficits. TPA was not given secondary to her late presentation. A CT scan of the head was unrevealing. An MRI showed  acute nonhemorrhagic linear infarcts involving the lateral left thalamus and potentially internal capsule. Infarcts felt to be likely small vessel disease. On no antithrombotics prior to admission. Now on aspirin 81 mg orally every day for secondary stroke prevention. Patient with resultant right hemiparesis and right hemisensory deficits. Work up underway.   Diabetes mellitus poorly controlled - hemoglobin A1c 13.1.  Hyperlipidemia - cholesterol 240; LDL 158 - consider Lipitor/Zocor.  ? UTI  Hypertension  Hypokalemia  Elevated hemoglobin  Hospital day # 1  TREATMENT/PLAN  Continue aspirin 81  mg orally every day for secondary stroke prevention.  Await therapy evaluations.  Risk factor modification  Needs EKG if not already performed.  Follow up with Dr Irven Baltimore at [facility] 6 weeks after discharge  Delton See PA-C [facility] Pager [phone] 01/31/2013, 8:27 AM  I have personally obtained a history, examined the patient, evaluated imaging results, and formulated the assessment and plan of care. I agree with the above.  Elspeth Cho, DO Neurology-Stroke

## 2013-01-31 NOTE — Progress Notes (Addendum)
Teaching booklets and handouts given to patient's brother; she can not read [place]; he does; videos will be shown in the room; she does not have a glucose meter; brother very supportive; awaiting nutrition to help with diet review; will need f/u translator to support patient teaching/using print outs from exit care that are in her native language as well.

## 2013-01-31 NOTE — Evaluation (Signed)
Physical Therapy Evaluation Patient Details Name: [name] MRN: [id number] DOB: [birth year] Today's Date: 01/31/2013 Time: 4098-1191 PT Time Calculation (min): 24 min  PT Assessment / Plan / Recommendation History of Present Illness  [name] is a 37 y.o. female, right handed, with a past medical history significant for HTN, DM, and hypercholesterolemia, brought to [facility] ED 01/30/2013 by EMS due to acute onset right sided weakness-numbness. Never had similar symptoms before. She was last seen normal by family at 1 am that day. Then, she went to the bathroom and complained of having weakness and numbness of the right side. Went to bed by approximately 15 minutes expressed to her family that she needed to go to the hospital. SBP 200 and CBG 307 when first evaluated by EMS. No reported spurred speech, vertigo, double vision, difficulty swallowing, imbalance, confusion, language or vision impairment. She did have a HA at home. Upon arrival to ED initial NIHSS 3.. MRI reveals linear acute infarcts involving the lateral left thalamus and potentially internal capsule.   Clinical Impression  Patient demonstrates deficits in functional mobility as indicated below. Patient currently requiring 2 person significant assist for ambulation. Based on deficits present, pt will benefit from continued skilled PT. Recommend continued skilled rehab upon discharge in intense setting, CIR. Will continue to see as indicated and progress as tolerated.     PT Assessment  Patient needs continued PT services    Follow Up Recommendations  CIR          Equipment Recommendations  Other (comment) (TBD)    Recommendations for Other Services Rehab consult   Frequency Min 3X/week    Precautions / Restrictions Precautions Precautions: Fall Restrictions Weight Bearing Restrictions: No   Pertinent Vitals/Pain No pain reported      Mobility  Bed Mobility Bed Mobility: Supine to Sit;Sitting - Scoot to Edge of  Bed Supine to Sit: 3: Mod assist Sitting - Scoot to Edge of Bed: 3: Mod assist Transfers Transfers: Sit to Stand;Stand to Sit Sit to Stand: 1: +2 Total assist;From bed;From chair/3-in-1 Sit to Stand: Patient Percentage: 60% Stand to Sit: 1: +2 Total assist;With armrests Stand to Sit: Patient Percentage: 50% Transfer via Lift Equipment: Stedy Details for Transfer Assistance: Pt with RLE buckling and poor control during standing, Required LE blocking to maintain upright position with bilateral support Ambulation/Gait Ambulation/Gait Assistance: 1: +2 Total assist Ambulation/Gait: Patient Percentage: 40% Ambulation Distance (Feet): 12 Feet Assistive device: 2 person hand held assist Ambulation/Gait Assistance Details: significant assist to maintain patient upright, patient RLE buckling during ambulation, difficulty weight shifting and translating. Poor tolerance for ambulation with easy fatigue. Had to use steady to get from commode back to the chair. Gait Pattern: Decreased dorsiflexion - right;Decreased weight shift to left;Right flexed knee in stance;Shuffle;Scissoring;Trunk flexed Gait velocity: decreased General Gait Details: Patient requires significant assist of 2 person for ambulation attempts Modified Rankin (Stroke Patients Only) Pre-Morbid Rankin Score: No significant disability Modified Rankin: Moderately severe disability    Exercises     PT Diagnosis: Difficulty walking;Abnormality of gait;Generalized weakness;Hemiplegia dominant side  PT Problem List: Decreased strength;Decreased range of motion;Decreased activity tolerance;Decreased balance;Decreased mobility;Decreased coordination;Decreased knowledge of use of DME PT Treatment Interventions: DME instruction;Gait training;Stair training;Functional mobility training;Therapeutic activities;Therapeutic exercise;Balance training;Patient/family education     PT Goals(Current goals can be found in the care plan section) Acute  Rehab PT Goals Patient Stated Goal: none stated PT Goal Formulation: With patient/family Time For Goal Achievement: 02/14/13 Potential to Achieve Goals: Good  Visit Information  Last PT Received On: 01/31/13 Assistance Needed: +2 History of Present Illness: [name] is a 37 y.o. female, right handed, with a past medical history significant for HTN, DM, and hypercholesterolemia, brought to [facility] ED 01/30/2013 by EMS due to acute onset right sided weakness-numbness. Never had similar symptoms before. She was last seen normal by family at 1 am that day. Then, she went to the bathroom and complained of having weakness and numbness of the right side. Went to bed by approximately 15 minutes expressed to her family that she needed to go to the hospital. SBP 200 and CBG 307 when first evaluated by EMS. No reported spurred speech, vertigo, double vision, difficulty swallowing, imbalance, confusion, language or vision impairment. She did have a HA at home. Upon arrival to ED initial NIHSS 3.. MRI reveals linear acute infarcts involving the lateral left thalamus and potentially internal capsule.        Prior Functioning  Home Living Family/patient expects to be discharged to:: Private residence Living Arrangements: Children;Other relatives (Lives with brother.) Available Help at Discharge: Family Type of Home: House Home Access: Stairs to enter Secretary/administrator of Steps: 3 Entrance Stairs-Rails: Right Home Layout: One level Home Equipment: None Prior Function Level of Independence: Independent Communication Communication: Prefers language other than English Dominant Hand: Right    Cognition  Cognition Arousal/Alertness: Awake/alert Behavior During Therapy: Impulsive Overall Cognitive Status: Difficult to assess Difficult to assess due to: Non-English speaking    Extremity/Trunk Assessment Upper Extremity Assessment Upper Extremity Assessment: Defer to OT evaluation Lower Extremity  Assessment Lower Extremity Assessment: RLE deficits/detail (Generalized weakness bilaterally) RLE Deficits / Details: <3/5 strength in RLE, unable to maintain wt bearing RLE Coordination: decreased fine motor;decreased gross motor   Balance Balance Balance Assessed: Yes Static Sitting Balance Static Sitting - Balance Support: Feet supported Static Sitting - Level of Assistance: 5: Stand by assistance  End of Session PT - End of Session Equipment Utilized During Treatment: Gait belt Activity Tolerance: Patient limited by fatigue Patient left: in chair;with call bell/phone within reach;with family/visitor present Nurse Communication: Mobility status;Need for lift equipment  GP     Fabio Asa 01/31/2013, 1:19 PM Charlotte Crumb, PT DPT  [phone]

## 2013-01-31 NOTE — Discharge Summary (Signed)
Family Medicine Teaching [facility] Discharge Summary  Patient name: [name] Medical record number: [id number] Date of birth: 17-Sep-1975 Age: 37 y.o. Gender: female Date of Admission: 01/30/2013  Date of Discharge: 02/03/2013 Admitting Physician: Tobey Grim, MD  Primary Care Provider: No PCP Per Patient Consultants: Neurology, Stroke Team  Indication for Hospitalization: Right sided weakness and numbness  Discharge Diagnoses/Problem List:  Principal Problem:   Acute ischemic stroke Active Problems:   Right sided weakness   HTN (hypertension)   DM (diabetes mellitus)   Hypokalemia Hyperlipidemia Elevated transaminases Polycythemia Hepatitis C  Disposition: Discharged to inpatient rehabilitation unit  Discharge Condition: Stable.   Discharge Exam:  General: Obese, well-appearing 37yo female in NAD.  Cardiovascular: RRR. No murmurs, rubs, or gallops.  Respiratory: CTAB. No rales, rhonchi, or wheezes  Abdomen: Normal BS, soft, nontender, nondistended.  Extremities: Warm, well perfused. No LE edema.  Skin: Warm, dry, intact.  Neuro: CN II-XII intact. RUE & RLE strength is 4/5  Brief Hospital Course:  [name] is a 37 y.o. female presented on 01/30/2013 with right sided numbness and weakness. PMH is significant for recently diagnosed HTN and DM for which she had not been taking any medications. A Code Stroke was called and tPA was not given as the time window had elapsed since symptom onset. CT head was negative, MRI of the brain showed acute nonhemorrhagic linear infarcts involving the left thalamus and potentially internal capsule, thought likely to be due to small vessel disease. MRA of the neck and carotid dopplers showed no significant stenosis or flow limitation bilaterally. Echocardiogram showed normal cardiac function and structure without evidence of embolic source.   [name] continued to have right-sided hemiparesis and hemisensory deficits and was referred  to PM&R after PT and OT recommended inpatient rehabilitation. She was accepted at inpatient rehabilitation and is expected to have improvement in her functional status upon discharge.  Neurology recommended daily aspirin 81mg  and risk factor modification, with 6-week follow up with Dr. Pearlean Brownie. A statin and an ace inhibitor were continued on discharge.   Pt's Hb A1c was 13.1% and she reported not tolerating metformin as an outpatient. She was started on lantus with sliding scale insulin while hospitalized with fair control of blood sugars. An incidental finding of mild transaminase elevation (AST 40, ALT 65) was worked up. An abdominal ultrasound showed no abnormalities, and a hepatitis panel revealed hepatitis C antibody reactivity. Other results from the panel were negative/non-reactive.   Issues for Follow Up:  - Risk factor modification in this 37 yo female with stroke including optimizing glycemic control.  - Would likely benefit from being seen by a hepatologist, as she was found to be hepatitis C positive.   Significant Procedures: None  Significant Labs and Imaging:   Recent Labs Lab 01/30/13 0640 01/30/13 0644 01/31/13 0545 02/01/13 0935  WBC 7.2  --  9.9 8.2  HGB 15.6* 16.3* 15.8* 16.1*  HCT 43.5 48.0* 43.9 45.4  PLT 276  --  284 265    Recent Labs Lab 01/30/13 0640 01/30/13 0644 01/31/13 0545 02/01/13 0935 02/01/13 1215  NA 136 138 136 134*  --   K 3.4* 3.3* 3.0* 3.6  --   CL 101 103 101 101  --   CO2 21  --  21 20  --   GLUCOSE 312* 314* 214* 295*  --   BUN 12 11 16 11   --   CREATININE 0.46* 0.60 0.49* 0.63  --   CALCIUM 8.7  --  8.7 9.0  --   ALKPHOS 91  --   --   --  85  AST 31  --   --   --  40*  ALT 59*  --   --   --  65*  ALBUMIN 3.3*  --   --   --  3.3*   Lipid Panel     Component Value Date/Time   CHOL 240* 01/31/2013 0545   TRIG 129 01/31/2013 0545   HDL 56 01/31/2013 0545   CHOLHDL 4.3 01/31/2013 0545   VLDL 26 01/31/2013 0545   LDLCALC 158*  01/31/2013 0545    Lab Results  Component Value Date   HGBA1C 13.1* 01/30/2013   ESR: 8   02/03/2013 05:00  Hep A IgM NON REACTIVE  Hepatitis B Surface Ag NEGATIVE  Hep B C IgM NON REACTIVE  HCV Ab Reactive (A)     Ct Head Wo Contrast  IMPRESSION: No acute intracranial abnormalities.   Mr Brain Wo Contrast; MRA  01/30/2013 IMPRESSION: MRI HEAD IMPRESSION 1. Acute nonhemorrhagic linear infarcts involving the lateral left thalamus and potentially internal capsule. The maximal length is 12 mm. 2. Scattered subcortical T2 hyperintensities are slightly greater than expected for age. 3. Minimal sinus disease. MRA HEAD IMPRESSION 1. Normal MRA circle of Willis without evidence for significant proximal stenosis, aneurysm, or branch vessel occlusion.   Echo:  Study Conclusions Left ventricle: The cavity size was normal. Wall thickness was increased in a pattern of mild LVH. Systolic function was normal. The estimated ejection fraction was in the range of 60% to 65%. Wall motion was normal; there were no regional wall motion abnormalities. Impressions: - No cardiac source of emboli was indentified.  Results/Tests Pending at Time of Discharge: ANA,   Discharge Medications:    Medication List    Notice   You have not been prescribed any medications.      Discharge Instructions: Please refer to Patient Instructions section of EMR for full details.  Patient was counseled important signs and symptoms that should prompt return to medical care, changes in medications, dietary instructions, activity restrictions, and follow up appointments.   Follow-Up Appointments:  Pt will need PCP follow up after discharge from inpatient rehabilitation.  Ryan B. Jarvis Newcomer, MD, PGY-1 02/03/2013 6:32 PM

## 2013-01-31 NOTE — Progress Notes (Signed)
FMTS Attending Daily Note:  Renold Don MD  [phone] pager  Family Practice pager:  [phone] I have seen and examined this patient and have reviewed their chart. I have discussed this patient with the resident. I agree with the resident's findings, assessment and care plan.  Tobey Grim, MD 01/31/2013 12:02 PM

## 2013-01-31 NOTE — Evaluation (Addendum)
Occupational Therapy Evaluation Patient Details Name: [name] MRN: [id number] DOB: [birth year] Today's Date: 01/31/2013 Time: 4098-1191 OT Time Calculation (min): 24 min  OT Assessment / Plan / Recommendation History of present illness [name] is a 37 y.o. female, right handed, with a past medical history significant for HTN, DM, and hypercholesterolemia, brought to [facility] ED 01/30/2013 by EMS due to acute onset right sided weakness-numbness. Never had similar symptoms before. She was last seen normal by family at 1 am that day. Then, she went to the bathroom and complained of having weakness and numbness of the right side. Went to bed by approximately 15 minutes expressed to her family that she needed to go to the hospital. SBP 200 and CBG 307 when first evaluated by EMS. No reported spurred speech, vertigo, double vision, difficulty swallowing, imbalance, confusion, language or vision impairment. She did have a HA at home. Upon arrival to ED initial NIHSS 3.. MRI reveals linear acute infarcts involving the lateral left thalamus and potentially internal capsule.    Clinical Impression   Pt presents with below problem list. Pt independent with ADLs, PTA. Pt will benefit from acute OT to increase independence prior to d/c. Recommending CIR for additional rehab.    OT Assessment  Patient needs continued OT Services    Follow Up Recommendations  CIR;Supervision/Assistance - 24 hour    Barriers to Discharge      Equipment Recommendations  Other (comment) (tbd)    Recommendations for Other Services Rehab consult  Frequency  Min 2X/week    Precautions / Restrictions Precautions Precautions: Fall Restrictions Weight Bearing Restrictions: No   Pertinent Vitals/Pain No pain reported.     ADL  Eating/Feeding: Minimal assistance Where Assessed - Eating/Feeding: Chair Grooming: Set up;Supervision/safety;Wash/dry hands Where Assessed - Grooming: Supported sitting Upper Body Dressing:  Moderate assistance Where Assessed - Upper Body Dressing: Supported sitting Lower Body Dressing: Moderate assistance Where Assessed - Lower Body Dressing: Supported sit to Pharmacist, hospital: +2 Total assistance Toilet Transfer: Patient Percentage: 60% Toilet Transfer Method: Sit to Barista: Materials engineer and Hygiene: +2 Total assistance Toileting - Clothing Manipulation and Hygiene: Patient Percentage: 50% Where Assessed - Toileting Clothing Manipulation and Hygiene: Sit to stand from 3-in-1 or toilet Tub/Shower Transfer Method: Not assessed Equipment Used: Gait belt Transfers/Ambulation Related to ADLs: +2 total A (40%) for ambulation. +2 Total (60-50%) for sit <> stand transfers. ADL Comments: Educated that pt use Rt hand has much as she can during activities. Assist to hold bowl as she cut up salad. Pt ambulated to bathroom and performed toileting tasks.  Spoke with family member about avoiding canned foods a lot of sodium as it increases risk for stroke.    OT Diagnosis: Acute pain;Hemiplegia dominant side  OT Problem List: Decreased strength;Decreased activity tolerance;Impaired balance (sitting and/or standing);Decreased knowledge of use of DME or AE;Decreased knowledge of precautions;Impaired UE functional use;Impaired vision/perception;Decreased coordination OT Treatment Interventions: Therapeutic exercise;Self-care/ADL training;DME and/or AE instruction;Neuromuscular education;Therapeutic activities;Patient/family education;Balance training;Visual/perceptual remediation/compensation;Cognitive remediation/compensation   OT Goals(Current goals can be found in the care plan section) Acute Rehab OT Goals Patient Stated Goal: none stated OT Goal Formulation: With patient/family Time For Goal Achievement: 02/07/13 Potential to Achieve Goals: Good ADL Goals Pt Will Perform Eating: with set-up;sitting Pt Will Perform  Grooming: with set-up;with supervision;standing Pt Will Perform Upper Body Dressing: with set-up;sitting Pt Will Perform Lower Body Dressing: with set-up;with supervision;sit to/from stand Pt Will Transfer to Toilet: with supervision;ambulating;bedside commode Pt  Will Perform Toileting - Clothing Manipulation and hygiene: with supervision;sit to/from stand  Visit Information  Last OT Received On: 01/31/13 Assistance Needed: +2 PT/OT Co-Evaluation/Treatment: Yes (most of session) History of Present Illness: [name] is a 38 y.o. female, right handed, with a past medical history significant for HTN, DM, and hypercholesterolemia, brought to [facility] ED 01/30/2013 by EMS due to acute onset right sided weakness-numbness. Never had similar symptoms before. She was last seen normal by family at 1 am that day. Then, she went to the bathroom and complained of having weakness and numbness of the right side. Went to bed by approximately 15 minutes expressed to her family that she needed to go to the hospital. SBP 200 and CBG 307 when first evaluated by EMS. No reported spurred speech, vertigo, double vision, difficulty swallowing, imbalance, confusion, language or vision impairment. She did have a HA at home. Upon arrival to ED initial NIHSS 3.. MRI reveals linear acute infarcts involving the lateral left thalamus and potentially internal capsule.        Prior Functioning     Home Living Family/patient expects to be discharged to:: Private residence Living Arrangements: Children;Other relatives (lives with brother) Available Help at Discharge: Family Type of Home: House Home Access: Stairs to enter Secretary/administrator of Steps: 3 Entrance Stairs-Rails: Right Home Layout: One level Home Equipment: None Prior Function Level of Independence: Independent Communication Communication: Prefers language other than English Dominant Hand: Right         Vision/Perception Vision - History Patient  Visual Report: Blurring of vision;Other (comment) (reports left eye worse than right)   Cognition  Cognition Arousal/Alertness: Awake/alert Behavior During Therapy: Impulsive Overall Cognitive Status: Difficult to assess Difficult to assess due to: Non-English speaking    Extremity/Trunk Assessment Upper Extremity Assessment Upper Extremity Assessment: RUE deficits/detail RUE Deficits / Details: weak grasp; 3-/5 AROM shoulder flexion  RUE Coordination: decreased gross motor Lower Extremity Assessment Lower Extremity Assessment: Defer to PT evaluation RLE Deficits / Details: <3/5 strength in RLE, unable to maintain wt bearing RLE Coordination: decreased fine motor;decreased gross motor     Mobility Bed Mobility Bed Mobility: Supine to Sit;Sitting - Scoot to Edge of Bed Supine to Sit: 3: Mod assist Sitting - Scoot to Edge of Bed: 3: Mod assist Details for Bed Mobility Assistance: Assistance to scoot and also to assist with trunk/legs when going to sitting position. Transfers Transfers: Sit to Stand;Stand to Sit Sit to Stand: 1: +2 Total assist;From bed;From chair/3-in-1 Sit to Stand: Patient Percentage: 60% Stand to Sit: 1: +2 Total assist;With armrests Stand to Sit: Patient Percentage: 50% Transfer via Lift Equipment: Stedy Details for Transfer Assistance: Pt with RLE buckling and poor control during standing, Required LE blocking to maintain upright position with bilateral support.      Exercise        End of Session OT - End of Session Equipment Utilized During Treatment: Gait belt Activity Tolerance: Patient tolerated treatment well Patient left: in chair;with call bell/phone within reach;with family/visitor present  GO     Earlie Raveling OTR/L [phone] 01/31/2013, 3:42 PM

## 2013-01-31 NOTE — Progress Notes (Signed)
Family Medicine Teaching Service Daily Progress Note Intern Pager: [phone]  Patient name: [name] Medical record number: [id number] Date of birth: 04/23/1975 Age: 37 y.o. Gender: female  Primary Care Provider: No PCP Per Patient Consultants: Neuro Code Status: Full Code  Pt Overview and Major Events to Date:  [name] is a 37 y.o. female presenting with right sided numbness and weakness. PMH is significant for recently diagnosed HTN and DM (not currently treated.)  - 10/24 - Presented to ED; Code Stroke; Evaluated by Neurology and started on ASA - MRI/MRA revealed acute nonhemorrhagic linear infarcts involving the lateral left thalamus and potentially internal capsule - Carotid doppler revealed no ICA stenosis; Echo - Normal EF with no source of cardiac emboli noted  Assessment and Plan: # Right sided numbness and weakness secondary to Acute stroke- Called as Code Stroke on arrival to the ED. Neurology evaluated patient.  - Neurology following; We appreciate their help in managing this patient.  - Echo unremarkable (see below) - Preliminary carotid duplex negative for ICA stenosis - Lipid panel obtained. Cholesterol elevated at 240 along with LDL of 158. Starting Lipitor 40 mg.  - A1C 13.1.  Will involve diabetes coordinator.  Patient currently on SSI.   - Will continue ASA 81 mg daily  - EKG today (not obtained prior) - Awaiting PT/OT evaluations  # HTN - BP elevated (153/99 this am). - Continuing Hydralazine 5mg  prn for SBP >185 or DBP >115  - Will consider starting ACEI today   # DM - Moderate SSI  - Hold metformin while inpatient, but restart at discharge  - Will also need to be started on Sulfonylurea on D/C   # Hypokalemia - K 3.0 today. - Will give Klor Con 40 mEq x 2  FEN/GI: Carb modified diet Prophylaxis: SCD  Disposition: Pending PT/OT evaluations  Subjective:  No overnight events.  Patient continues to report right sided weakness.  Objective: Temp:   [97 F (36.1 C)-99 F (37.2 C)] 99 F (37.2 C) (10/25 0529) Pulse Rate:  [89-109] 93 (10/25 0529) Resp:  [13-23] 20 (10/25 0529) BP: (144-191)/(93-119) 153/99 mmHg (10/25 0529) SpO2:  [96 %-100 %] 98 % (10/25 0529) Weight:  [154 lb 12.2 oz (70.2 kg)-155 lb (70.308 kg)] 154 lb 12.2 oz (70.2 kg) (10/24 0945)  Physical Exam: General: well appearing female, NAD. Cardiovascular: RRR. No murmurs, rubs, or gallops. Respiratory: CTAB. No rales, rhonchi, or wheeze. Abdomen: soft, nontender, nondistended. Extremities: warm, well perfused. No LE edema. Skin: Warm, dry, intact. Neuro: CN 2-12 intact. Decreased strength of the right upper and lower extremity (Grip strength 4/5, Plantar flexion 4/5, Knee extension/flexion 4/5).   Laboratory:  Recent Labs Lab 01/30/13 0640 01/30/13 0644 01/31/13 0545  WBC 7.2  --  9.9  HGB 15.6* 16.3* 15.8*  HCT 43.5 48.0* 43.9  PLT 276  --  284    Recent Labs Lab 01/30/13 0640 01/30/13 0644 01/31/13 0545  NA 136 138 136  K 3.4* 3.3* 3.0*  CL 101 103 101  CO2 21  --  21  BUN 12 11 16   CREATININE 0.46* 0.60 0.49*  CALCIUM 8.7  --  8.7  PROT 7.3  --   --   BILITOT 0.4  --   --   ALKPHOS 91  --   --   ALT 59*  --   --   AST 31  --   --   GLUCOSE 312* 314* 214*   Lipid Panel     Component  Value Date/Time   CHOL 240* 01/31/2013 0545   TRIG 129 01/31/2013 0545   HDL 56 01/31/2013 0545   CHOLHDL 4.3 01/31/2013 0545   VLDL 26 01/31/2013 0545   LDLCALC 158* 01/31/2013 0545    Lab Results  Component Value Date   HGBA1C 13.1* 01/30/2013   Imaging/Diagnostic Tests:  Ct Head Wo Contrast IMPRESSION: No acute intracranial abnormalities.    Mr Brain Wo Contrast; MRA 01/30/2013  IMPRESSION: MRI HEAD IMPRESSION  1. Acute nonhemorrhagic linear infarcts involving the lateral left thalamus and potentially internal capsule. The maximal length is 12 mm. 2. Scattered subcortical T2 hyperintensities are slightly greater than expected for age. 3.  Minimal sinus disease.  MRA HEAD IMPRESSION  1. Normal MRA circle of Willis without evidence for significant proximal stenosis, aneurysm, or branch vessel occlusion.    Echo: Study Conclusions Left ventricle: The cavity size was normal. Wall thickness was increased in a pattern of mild LVH. Systolic function was normal. The estimated ejection fraction was in the range of 60% to 65%. Wall motion was normal; there were no regional wall motion abnormalities. Impressions: - No cardiac source of emboli was indentified.  Tommie Sams, DO 01/31/2013, 8:23 AM PGY-2, [facility] FPTS Intern pager: [phone], text pages welcome

## 2013-01-31 NOTE — Progress Notes (Signed)
Received consult. Spoke with staff RN per phone call about patient's diabetes.  She does not speak [place]. Brother is with patient and translates for her. Staff RN states that patient's family is bringing in food for patient to eat.  Suggest that a dietician speak with the family about meal planning.  Patient was diagnosed recently with diabetes and was started on Metformin.  HgbA1C is 13.1% due to the fact that patient had stopped taking the Metformin because it made her feel nauseated and feel bad.  Patient needsto  be followed by PCP at discharge for medication adjustments and diabetes management.    Recommend starting Lantus or Levemir 10 units daily while in the hospital and if CBGs continue greater than 180 mg/dl.  Continue Novolog MODERATE correction scale AC & HS.  Assess patient at discharge for possibility of taking oral medications at home.  Will recommend that staff RNs work with family on teaching DM survival skills with videos, DM Mosby notes, Living Well with Diabetes booklet, (if family members can read [place]), and teaching patient to do own CBGs.  Will need a prescription for home blood glucose meter and strips at discharge.   Will continue to follow while in hospital.     Smith Mince RN BSN CDE

## 2013-02-01 LAB — BASIC METABOLIC PANEL
BUN: 11 mg/dL (ref 6–23)
CO2: 20 mEq/L (ref 19–32)
Calcium: 9 mg/dL (ref 8.4–10.5)
Chloride: 101 mEq/L (ref 96–112)
Creatinine, Ser: 0.63 mg/dL (ref 0.50–1.10)
GFR calc Af Amer: >90 mL/min (ref >90)
GFR calc non Af Amer: >90 mL/min (ref >90)
Glucose, Bld: 295 mg/dL — ABNORMAL HIGH (ref 70–99)
Potassium: 3.6 mEq/L (ref 3.5–5.1)
Sodium: 134 mEq/L — ABNORMAL LOW (ref 135–145)

## 2013-02-01 LAB — HEPATIC FUNCTION PANEL
ALT: 65 U/L — ABNORMAL HIGH (ref 0–35)
AST: 40 U/L — ABNORMAL HIGH (ref 0–37)
Albumin: 3.3 g/dL — ABNORMAL LOW (ref 3.5–5.2)
Alkaline Phosphatase: 85 U/L (ref 39–117)
Bilirubin, Direct: 0.1 mg/dL (ref 0.0–0.3)
Indirect Bilirubin: 0.6 mg/dL (ref 0.3–0.9)
Total Bilirubin: 0.7 mg/dL (ref 0.3–1.2)
Total Protein: 7.6 g/dL (ref 6.0–8.3)

## 2013-02-01 LAB — RETICULOCYTES
RBC.: 6.03 MIL/uL — ABNORMAL HIGH (ref 3.87–5.11)
Retic Count, Absolute: 144.7 10*3/uL (ref 19.0–186.0)
Retic Ct Pct: 2.4 % (ref 0.4–3.1)

## 2013-02-01 LAB — GLUCOSE, CAPILLARY
Glucose-Capillary: 203 mg/dL — ABNORMAL HIGH (ref 70–99)
Glucose-Capillary: 214 mg/dL — ABNORMAL HIGH (ref 70–99)
Glucose-Capillary: 265 mg/dL — ABNORMAL HIGH (ref 70–99)
Glucose-Capillary: 135 mg/dL — ABNORMAL HIGH (ref 70–99)

## 2013-02-01 LAB — IRON AND TIBC
Iron: 81 ug/dL (ref 42–135)
Saturation Ratios: 24 % (ref 20–55)
TIBC: 342 ug/dL (ref 250–470)
UIBC: 261 ug/dL (ref 125–400)

## 2013-02-01 LAB — CBC
HCT: 45.4 % (ref 36.0–46.0)
Hemoglobin: 16.1 g/dL — ABNORMAL HIGH (ref 12.0–15.0)
MCH: 28.1 pg (ref 26.0–34.0)
MCHC: 35.5 g/dL (ref 30.0–36.0)
MCV: 79.4 fL (ref 78.0–100.0)
Platelets: 265 10*3/uL (ref 150–400)
RBC: 5.72 MIL/uL — ABNORMAL HIGH (ref 3.87–5.11)
RDW: 13.2 % (ref 11.5–15.5)
WBC: 8.2 10*3/uL (ref 4.0–10.5)

## 2013-02-01 LAB — VITAMIN B12: Vitamin B-12: 1330 pg/mL — ABNORMAL HIGH (ref 211–911)

## 2013-02-01 LAB — FERRITIN: Ferritin: 46 ng/mL (ref 10–291)

## 2013-02-01 LAB — FOLATE: Folate: >20 ng/mL

## 2013-02-01 MED ORDER — ATORVASTATIN CALCIUM 40 MG PO TABS
40.0000 mg | ORAL_TABLET | Freq: Every day | ORAL | Status: DC
  Administered 2013-02-01 – 2013-02-03 (×3): 40 mg via ORAL
  Filled 2013-02-01 (×3): qty 1

## 2013-02-01 MED ORDER — LISINOPRIL 5 MG PO TABS
5.0000 mg | ORAL_TABLET | Freq: Every day | ORAL | Status: DC
  Administered 2013-02-01 – 2013-02-03 (×3): 5 mg via ORAL
  Filled 2013-02-01 (×3): qty 1

## 2013-02-01 NOTE — Progress Notes (Signed)
FMTS Attending Daily Note:  Renold Don MD  [phone] pager  Family Practice pager:  [phone] I have seen and examined this patient and have reviewed their chart. I have discussed this patient with the resident. I agree with the resident's findings, assessment and care plan.  Additionally:  Overall, from stroke standpoint, patient is improving.  Still unclear exact reason for stroke, she is fairly young despite her risk factors (elevated HLD, HTN, DM2).  On examination, she has fairly striking moon facies and abdominal striae/obesity.  Not volume deplete/dehydrated.   1.  Stroke: - appreciate neurology input - awaiting placement currently - maximize treatment of risk factors.   2.  Erythrocytosis: - not quite to point where we would consider polycythemia -- WBC/platelets WNL. - interestingly, she is microcytic.  From [place], so thallesemia a possibility, thought would expect anemia.   - I do not feel a spleen on examation today.   - Non-smoker, no history of steroid usage.   - Could possibly change management if secondary cause found --> already with stroke so would consider adding further anticoagulation - Epo level today, plus iron panel.   - Has elevated LFTs plus microcytic hematuria:  Very low liklihood of renal cell or liver carcinoma, though these things can also cause erythrocytosis. - await further labwork to drive decision process.  Would recommend repeat UA once glucose under better control to ensure no further hematuria.    3.  HLD: - Atorvastatin - need to monitor LFTs on this medication as she has already demonstrated mild LFT elevation  4.  Transaminitis: - hepatic panel today. - check for hepatitis.   - likely secondary to fatty liver  5.  Truncal obesity: - likely secondary to poor diet and resulting obesity-->abdominal striae.  Believe moon facies more likely obesity as well.  Not on chronic steroids - abdominal ultrasound to assess for fatty liver.    Tobey Grim, MD 02/01/2013 10:55 AM

## 2013-02-01 NOTE — Progress Notes (Signed)
  RD consulted for nutrition education regarding diabetes.   Lab Results  Component Value Date   HGBA1C 13.1* 01/30/2013   Attempted to visit pt this morning to provide diet education with her and her brother; pt's brother not present. Pt reported her brother will be here in the afternoon but, she didn't know what time. RD re-visited pt in the afternoon but, pt about to receive a bath and brother not present. Left pt with "Type 2 Diabetes Nutrition Therapy" handout from the [employer]. Per RN pt desperately needs diet education; pt has been non-compliant with diet and medications; family is bringing pt food from home and pt is eating large portions. Will leave consult for RD to follow-up and provide education on Monday.  Ian Malkin RD, LDN Inpatient Clinical Dietitian Pager: [phone] After Hours Pager: [phone]

## 2013-02-01 NOTE — Progress Notes (Signed)
Family Medicine Teaching Service Daily Progress Note Intern Pager: [phone]  Patient name: [name] Medical record number: [id number] Date of birth: March 12, 1976 Age: 38 y.o. Gender: female  Primary Care Provider: No PCP Per Patient Consultants: Neuro Code Status: Full Code  Pt Overview and Major Events to Date:  [name] is a 37 y.o. female presenting with right sided numbness and weakness. PMH is significant for recently diagnosed HTN and DM (not currently treated.)  - 10/24 - Presented to ED; Code Stroke; Evaluated by Neurology and started on ASA - MRI/MRA revealed acute nonhemorrhagic linear infarcts involving the lateral left thalamus and potentially internal capsule - Carotid doppler - Findings consistent with 1 to 39 percent ICA stenosis. Vertebral artery flow antegrade bilaterally. -  Echo - Normal EF with no source of cardiac emboli noted - 10/25 - PT recommending CIR  Assessment and Plan: # Right sided numbness and weakness secondary to Acute stroke- Called as Code Stroke on arrival to the ED. Neurology evaluated patient.  - Echo unremarkable (see below) - Carotid doppler - Findings consistent with 1 to 39 percent ICA stenosis. - Continue statin - Will continue ASA 81 mg daily  - PT recommending CIR.  Patient to be evaluated Monday.  # HTN - Lisinopril 5 mg daily - Patient will need outpatient follow up for this  # DM - Moderate SSI; Lantus 10 Units daily - Hold metformin while inpatient, but restart at discharge  - Will also need to be started on Sulfonylurea on D/C   # Hypokalemia - Repeat BMP today  FEN/GI: Carb modified diet Prophylaxis: SCD  Disposition: Pending CIR vs. SNF  Subjective:  No overnight events.  Patient continues to have right sided weakness.  Objective: Temp:  [97.8 F (36.6 C)-98.8 F (37.1 C)] 98.2 F (36.8 C) (10/26 0519) Pulse Rate:  [90-103] 90 (10/26 0519) Resp:  [20] 20 (10/26 0519) BP: (139-168)/(88-111) 146/96 mmHg (10/26  0519) SpO2:  [95 %-98 %] 97 % (10/26 0519)  Physical Exam: General: well appearing female, NAD. Cardiovascular: RRR. No murmurs, rubs, or gallops. Respiratory: CTAB. No rales, rhonchi, or wheeze. Abdomen: soft, nontender, nondistended. Extremities: warm, well perfused. No LE edema. Skin: Warm, dry, intact. Neuro: CN 2-12 intact. Decreased strength of the right upper and lower extremity (Grip strength 3/5, Plantar flexion 4/5, Knee extension/flexion 4/5).   Laboratory:  Recent Labs Lab 01/30/13 0640 01/30/13 0644 01/31/13 0545  WBC 7.2  --  9.9  HGB 15.6* 16.3* 15.8*  HCT 43.5 48.0* 43.9  PLT 276  --  284    Recent Labs Lab 01/30/13 0640 01/30/13 0644 01/31/13 0545  NA 136 138 136  K 3.4* 3.3* 3.0*  CL 101 103 101  CO2 21  --  21  BUN 12 11 16   CREATININE 0.46* 0.60 0.49*  CALCIUM 8.7  --  8.7  PROT 7.3  --   --   BILITOT 0.4  --   --   ALKPHOS 91  --   --   ALT 59*  --   --   AST 31  --   --   GLUCOSE 312* 314* 214*   Lipid Panel     Component Value Date/Time   CHOL 240* 01/31/2013 0545   TRIG 129 01/31/2013 0545   HDL 56 01/31/2013 0545   CHOLHDL 4.3 01/31/2013 0545   VLDL 26 01/31/2013 0545   LDLCALC 158* 01/31/2013 0545    Lab Results  Component Value Date   HGBA1C 13.1* 01/30/2013  Imaging/Diagnostic Tests:  Ct Head Wo Contrast IMPRESSION: No acute intracranial abnormalities.    Mr Brain Wo Contrast; MRA 01/30/2013  IMPRESSION: MRI HEAD IMPRESSION  1. Acute nonhemorrhagic linear infarcts involving the lateral left thalamus and potentially internal capsule. The maximal length is 12 mm. 2. Scattered subcortical T2 hyperintensities are slightly greater than expected for age. 3. Minimal sinus disease.  MRA HEAD IMPRESSION  1. Normal MRA circle of Willis without evidence for significant proximal stenosis, aneurysm, or branch vessel occlusion.    Echo: Study Conclusions Left ventricle: The cavity size was normal. Wall thickness was increased in a  pattern of mild LVH. Systolic function was normal. The estimated ejection fraction was in the range of 60% to 65%. Wall motion was normal; there were no regional wall motion abnormalities. Impressions: - No cardiac source of emboli was indentified.  Carotid dopplers: Summary: - Findings consistent with 1 to 39 percent ICA stenosis. Vertebral artery flow antegrade bilaterally.  Tommie Sams, DO 02/01/2013, 8:47 AM PGY-2, [facility] FPTS Intern pager: [phone], text pages welcome

## 2013-02-01 NOTE — Progress Notes (Signed)
Stroke Team Progress Note  HISTORY [name] is a 37 y.o. female, right handed, with a past medical history significant for HTN, DM, and hypercholesterolemia, brought to [facility] ED 01/30/2013 by EMS due to acute onset right sided weakness-numbness. Never had similar symptoms before. She was last seen normal by family at 1 am that day. Then, she went to the bathroom and complained of having weakness and numbness of the right side. Went to bed by approximately 15 minutes expressed to her family that she needed to go to the hospital. SBP 200 and CBG 307 when first evaluated by EMS. No reported spurred speech, vertigo, double vision, difficulty swallowing, imbalance, confusion, language or vision impairment. She did have a HA at home. Upon arrival to ED initial NIHSS 3. CT brain showed no acute intracranial abnormality.   Date last known well:  Time last known well:  tPA Given: no, late presentation, NIHSS 3  NIHSS: 3  MRS: 1   SUBJECTIVE There are 3 young children in the room this morning. The patient states that she has mild headaches at times. She is speaking some English today as her brother is not here to translate. She feels her numbness is improving. The patient's nurse reports that she has a very unsteady gait when they ambulate to the bathroom.  OBJECTIVE Most recent Vital Signs: Filed Vitals:   01/31/13 1719 01/31/13 2128 02/01/13 0134 02/01/13 0519  BP: 148/111 168/108 152/101 146/96  Pulse: 93 92 96 90  Temp: 98.8 F (37.1 C) 98.7 F (37.1 C) 97.8 F (36.6 C) 98.2 F (36.8 C)  TempSrc: Oral Oral Oral Oral  Resp: 20 20 20 20   Height:      Weight:      SpO2: 98% 98% 97% 97%   CBG (last 3)   Recent Labs  01/31/13 1620 01/31/13 2005 02/01/13 0624  GLUCAP 241* 227* 203*    IV Fluid Intake:      MEDICATIONS  . aspirin EC  81 mg Oral Daily  . influenza vac split quadrivalent PF  0.5 mL Intramuscular Tomorrow-1000  . insulin aspart  0-15 Units Subcutaneous TID WC  .  insulin aspart  0-5 Units Subcutaneous QHS  . insulin glargine  10 Units Subcutaneous Daily  . lisinopril  5 mg Oral Daily  . sodium chloride  3 mL Intravenous Q12H   PRN:  acetaminophen, cyclobenzaprine, hydrALAZINE  Diet:  Carb Control thin liquids Activity: Up with assistance DVT Prophylaxis:  SCDs  CLINICALLY SIGNIFICANT STUDIES Basic Metabolic Panel:   Recent Labs Lab 01/30/13 0640 01/30/13 0644 01/31/13 0545  NA 136 138 136  K 3.4* 3.3* 3.0*  CL 101 103 101  CO2 21  --  21  GLUCOSE 312* 314* 214*  BUN 12 11 16   CREATININE 0.46* 0.60 0.49*  CALCIUM 8.7  --  8.7   Liver Function Tests:   Recent Labs Lab 01/30/13 0640  AST 31  ALT 59*  ALKPHOS 91  BILITOT 0.4  PROT 7.3  ALBUMIN 3.3*   CBC:   Recent Labs Lab 01/30/13 0640 01/30/13 0644 01/31/13 0545  WBC 7.2  --  9.9  NEUTROABS 4.4  --   --   HGB 15.6* 16.3* 15.8*  HCT 43.5 48.0* 43.9  MCV 77.8*  --  78.1  PLT 276  --  284   Coagulation:   Recent Labs Lab 01/30/13 0640  LABPROT 11.5*  INR 0.85   Cardiac Enzymes:   Recent Labs Lab 01/30/13 0640  TROPONINI <0.30  Urinalysis:   Recent Labs Lab 01/30/13 0754  COLORURINE YELLOW  LABSPEC 1.010  PHURINE 7.0  GLUCOSEU >1000*  HGBUR MODERATE*  BILIRUBINUR NEGATIVE  KETONESUR NEGATIVE  PROTEINUR NEGATIVE  UROBILINOGEN 0.2  NITRITE NEGATIVE  LEUKOCYTESUR MODERATE*   Lipid Panel    Component Value Date/Time   CHOL 240* 01/31/2013 0545   TRIG 129 01/31/2013 0545   HDL 56 01/31/2013 0545   CHOLHDL 4.3 01/31/2013 0545   VLDL 26 01/31/2013 0545   LDLCALC 158* 01/31/2013 0545   HgbA1C  Lab Results  Component Value Date   HGBA1C 13.1* 01/30/2013    Urine Drug Screen:     Component Value Date/Time   LABOPIA NONE DETECTED 01/30/2013 0754   COCAINSCRNUR NONE DETECTED 01/30/2013 0754   LABBENZ NONE DETECTED 01/30/2013 0754   AMPHETMU NONE DETECTED 01/30/2013 0754   THCU NONE DETECTED 01/30/2013 0754   LABBARB NONE DETECTED  01/30/2013 0754    Alcohol Level:   Recent Labs Lab 01/30/13 0640  ETH <11    Ct Head Wo Contrast 01/30/2013    No acute intracranial abnormalities.   MRI Head Wo Contrast 01/30/2013    1. Acute nonhemorrhagic linear infarcts involving the lateral left thalamus and potentially internal capsule. The maximal length is 12 mm. 2. Scattered subcortical T2 hyperintensities are slightly greater than expected for age. 3. Minimal sinus disease.    MRA Head Wo Contrast 01/30/2013    Normal MRA circle of Willis without evidence for significant proximal stenosis, aneurysm, or branch vessel occlusion.    2D Echocardiogram  - ejection fraction 60-65%. No cardiac source of emboli was identified.  Carotid Doppler  Preliminary report: No significant ICA stenosis noted bilaterally. Vertebral artery flow antegrade.  CXR    EKG pending  Therapy Recommendations - inpatient rehabilitation recommended.  Physical Exam    Neurological Examination  Mental Status:  Alert, awake,oriented, thought content appropriate. Speech fluent without evidence of aphasia. Able to follow 3 step commands without difficulty.  Cranial Nerves:  II: Discs flat bilaterally; Visual fields grossly normal, pupils equal, round, reactive to light and accommodation  III,IV, VI: ptosis not present, extra-ocular motions intact bilaterally  V,VII: smile symmetric, facial light touch sensation normal bilaterally  VIII: hearing normal bilaterally  IX,X: gag reflex present  XI: bilateral shoulder shrug  XII: midline tongue extension without atrophy or fasciculations  Motor:  Right sided weakness but some inconsistencies on exam. 4/5 right upper and lower extremities. 5/5 on left. Tone and bulk:normal tone throughout; no atrophy noted  Sensory: Pinprick and light touch intact diminished on the right side.  Deep Tendon Reflexes:  1+ all over  Plantars:  Right: downgoing Left: downgoing  Cerebellar:  normal finger-to-nose  and normal heel-to-shin test in the left. Poor effort and questionable weakness in the right precludes testing.  Gait:  No ataxia.  CV: pulses palpable throughout     ASSESSMENT [name] is a 37 y.o. female presenting with right hemiparesis and hemisensory deficits. TPA was not given secondary to her late presentation. A CT scan of the head was unrevealing. An MRI showed  acute nonhemorrhagic linear infarcts involving the lateral left thalamus and potentially internal capsule. Infarcts felt to be likely small vessel disease. On no antithrombotics prior to admission. Now on aspirin 81 mg orally every day for secondary stroke prevention. Patient with resultant right hemiparesis and right hemisensory deficits. Work up underway.   Diabetes mellitus poorly controlled - hemoglobin A1c 13.1.  Hyperlipidemia - cholesterol 240; LDL 158 -  consider Lipitor/Zocor.  Hypertension  Hypokalemia  Elevated hemoglobin  Hospital day # 2  TREATMENT/PLAN  Continue aspirin 81 mg orally every day for secondary stroke prevention.  Await M.D. rehabilitation consult.  Risk factor modification  Await EKG.  Follow up with Dr Irven Baltimore at [facility] 6 weeks after discharge  Delton See PA-C [facility] Pager [phone] 02/01/2013, 9:43 AM  I have personally obtained a history, examined the patient, evaluated imaging results, and formulated the assessment and plan of care. I agree with the above.  Elspeth Cho, DO Neurology-Stroke

## 2013-02-02 ENCOUNTER — Inpatient Hospital Stay (HOSPITAL_COMMUNITY): Payer: BC Managed Care – PPO

## 2013-02-02 DIAGNOSIS — I6330 Cerebral infarction due to thrombosis of unspecified cerebral artery: Secondary | ICD-10-CM

## 2013-02-02 DIAGNOSIS — R7401 Elevation of levels of liver transaminase levels: Secondary | ICD-10-CM

## 2013-02-02 DIAGNOSIS — R7402 Elevation of levels of lactic acid dehydrogenase (LDH): Secondary | ICD-10-CM

## 2013-02-02 LAB — GLUCOSE, CAPILLARY
Glucose-Capillary: 183 mg/dL — ABNORMAL HIGH (ref 70–99)
Glucose-Capillary: 277 mg/dL — ABNORMAL HIGH (ref 70–99)
Glucose-Capillary: 259 mg/dL — ABNORMAL HIGH (ref 70–99)
Glucose-Capillary: 160 mg/dL — ABNORMAL HIGH (ref 70–99)

## 2013-02-02 LAB — ERYTHROPOIETIN: Erythropoietin: 15.4 m[IU]/mL (ref 2.6–18.5)

## 2013-02-02 NOTE — Progress Notes (Signed)
Stroke Team Progress Note  HISTORY [name] is a 37 y.o. female, right handed, with a past medical history significant for HTN, DM, and hypercholesterolemia, brought to [facility] ED 01/30/2013 by EMS due to acute onset right sided weakness-numbness. Never had similar symptoms before. She was last seen normal by family at 1 am that day. Then, she went to the bathroom and complained of having weakness and numbness of the right side. Went to bed by approximately 15 minutes expressed to her family that she needed to go to the hospital. SBP 200 and CBG 307 when first evaluated by EMS. No reported spurred speech, vertigo, double vision, difficulty swallowing, imbalance, confusion, language or vision impairment. She did have a HA at home. Upon arrival to ED initial NIHSS 3. CT brain showed no acute intracranial abnormality.   Date last known well:  Time last known well:  tPA Given: no, late presentation, NIHSS 3  NIHSS: 3  MRS: 1   SUBJECTIVE Patient sitting in chair. Nurse in room. Patient able to follow commands.   OBJECTIVE Most recent Vital Signs: Filed Vitals:   02/01/13 2210 02/02/13 0055 02/02/13 0420 02/02/13 1016  BP: 129/87 125/67 115/81 120/75  Pulse: 88 93 93 95  Temp: 98.6 F (37 C) 98.3 F (36.8 C) 98.4 F (36.9 C) 97.4 F (36.3 C)  TempSrc: Oral Oral Oral Oral  Resp: 18 16 16 16   Height:      Weight:      SpO2: 97% 98% 100% 98%   CBG (last 3)   Recent Labs  02/01/13 1624 02/01/13 2209 02/02/13 0643  GLUCAP 265* 135* 183*    IV Fluid Intake:      MEDICATIONS  . aspirin EC  81 mg Oral Daily  . atorvastatin  40 mg Oral q1800  . insulin aspart  0-15 Units Subcutaneous TID WC  . insulin aspart  0-5 Units Subcutaneous QHS  . insulin glargine  10 Units Subcutaneous Daily  . lisinopril  5 mg Oral Daily  . sodium chloride  3 mL Intravenous Q12H   PRN:  acetaminophen, cyclobenzaprine, hydrALAZINE  Diet:  Carb Control thin liquids Activity: Up with assistance DVT  Prophylaxis:  SCDs  CLINICALLY SIGNIFICANT STUDIES Basic Metabolic Panel:   Recent Labs Lab 01/31/13 0545 02/01/13 0935  NA 136 134*  K 3.0* 3.6  CL 101 101  CO2 21 20  GLUCOSE 214* 295*  BUN 16 11  CREATININE 0.49* 0.63  CALCIUM 8.7 9.0   Liver Function Tests:   Recent Labs Lab 01/30/13 0640 02/01/13 1215  AST 31 40*  ALT 59* 65*  ALKPHOS 91 85  BILITOT 0.4 0.7  PROT 7.3 7.6  ALBUMIN 3.3* 3.3*   CBC:   Recent Labs Lab 01/30/13 0640  01/31/13 0545 02/01/13 0935  WBC 7.2  --  9.9 8.2  NEUTROABS 4.4  --   --   --   HGB 15.6*  < > 15.8* 16.1*  HCT 43.5  < > 43.9 45.4  MCV 77.8*  --  78.1 79.4  PLT 276  --  284 265  < > = values in this interval not displayed. Coagulation:   Recent Labs Lab 01/30/13 0640  LABPROT 11.5*  INR 0.85   Cardiac Enzymes:   Recent Labs Lab 01/30/13 0640  TROPONINI <0.30   Urinalysis:   Recent Labs Lab 01/30/13 0754  COLORURINE YELLOW  LABSPEC 1.010  PHURINE 7.0  GLUCOSEU >1000*  HGBUR MODERATE*  BILIRUBINUR NEGATIVE  KETONESUR NEGATIVE  PROTEINUR  NEGATIVE  UROBILINOGEN 0.2  NITRITE NEGATIVE  LEUKOCYTESUR MODERATE*   Lipid Panel    Component Value Date/Time   CHOL 240* 01/31/2013 0545   TRIG 129 01/31/2013 0545   HDL 56 01/31/2013 0545   CHOLHDL 4.3 01/31/2013 0545   VLDL 26 01/31/2013 0545   LDLCALC 158* 01/31/2013 0545   HgbA1C  Lab Results  Component Value Date   HGBA1C 13.1* 01/30/2013    Urine Drug Screen:     Component Value Date/Time   LABOPIA NONE DETECTED 01/30/2013 0754   COCAINSCRNUR NONE DETECTED 01/30/2013 0754   LABBENZ NONE DETECTED 01/30/2013 0754   AMPHETMU NONE DETECTED 01/30/2013 0754   THCU NONE DETECTED 01/30/2013 0754   LABBARB NONE DETECTED 01/30/2013 0754    Alcohol Level:   Recent Labs Lab 01/30/13 0640  ETH <11    Ct Head Wo Contrast 01/30/2013    No acute intracranial abnormalities.   MRI Head Wo Contrast 01/30/2013    1. Acute nonhemorrhagic linear  infarcts involving the lateral left thalamus and potentially internal capsule. The maximal length is 12 mm. 2. Scattered subcortical T2 hyperintensities are slightly greater than expected for age. 3. Minimal sinus disease.    MRA Head Wo Contrast 01/30/2013    Normal MRA circle of Willis without evidence for significant proximal stenosis, aneurysm, or branch vessel occlusion.    2D Echocardiogram  - ejection fraction 60-65%. No cardiac source of emboli was identified.  Carotid Doppler  Preliminary report: No significant ICA stenosis noted bilaterally. Vertebral artery flow antegrade.  CXR    EKG NSR 80s  Therapy Recommendations - inpatient rehabilitation recommended.  Physical Exam   Neurological Examination Mental Status:  Alert, awake,oriented, thought content appropriate. Speech fluent without evidence of aphasia. Able to follow 3 step commands without difficulty.  Cranial Nerves:  II: Discs flat bilaterally; Visual fields grossly normal, pupils equal, round, reactive to light and accommodation  III,IV, VI: ptosis not present, extra-ocular motions intact bilaterally  V,VII: smile symmetric, facial light touch sensation normal bilaterally  VIII: hearing normal bilaterally  IX,X: gag reflex present  XI: bilateral shoulder shrug  XII: midline tongue extension without atrophy or fasciculations  Motor:  Right sided weakness but some inconsistencies on exam. 4/5 right upper and lower extremities. 5/5 on left. Tone and bulk:normal tone throughout; no atrophy noted  Sensory: Pinprick and light touch intact diminished on the right side.  Deep Tendon Reflexes:  1+ all over  Plantars:  Right: downgoing Left: downgoing  Cerebellar:  normal finger-to-nose and normal heel-to-shin test in the left. Poor effort and questionable weakness in the right precludes testing.  Gait:  No ataxia.  CV: pulses palpable throughout   ASSESSMENT Ms. [name] is a 37 y.o. female presenting with right  hemiparesis and hemisensory deficits. TPA was not given secondary to her late presentation. A CT scan of the head was unrevealing. An MRI showed  acute nonhemorrhagic linear infarcts involving the lateral left thalamus and potentially internal capsule. Infarcts felt to be likely small vessel disease. On no antithrombotics prior to admission. Now on aspirin 81 mg orally every day for secondary stroke prevention. Patient with resultant right hemiparesis and right hemisensory deficits. Work up underway.   Diabetes mellitus, type 2, poorly controlled - hemoglobin A1c 13.1, goal < 6.5  Hyperlipidemia - cholesterol 240; LDL 158 - on statin  Hypertension  Hypokalemia, resolved  Hospital day # 3  TREATMENT/PLAN  Change to plavix 75mg  daily for secondary stroke prevention due to multiple risk  factors.Marland Kitchen  CIR  Risk factor modification  Patient may proceed to CIR once bed available from neurological standpoint.  Stroke service to sign off  Have patient follow up with Dr. Pearlean Brownie in 2 months in stroke clinic.  Gwendolyn Lima. Manson Passey, [facility], MBA, MHA [facility] Pager: [phone] 02/02/2013 11:06 AM  I have personally obtained a history, examined the patient, evaluated imaging results, and formulated the assessment and plan of care. I agree with the above.  Delia Heady, MD

## 2013-02-02 NOTE — Progress Notes (Signed)
MD made aware that Lantus 10 units was held and pt was covered by 8 units of Novolog this afternoon. Pt has been NPO this morning and was started back on carb mod diet. MD wants to continue to hold off on the Lantus for now and monitor.

## 2013-02-02 NOTE — Consult Note (Signed)
Physical Medicine and Rehabilitation Consult  Reason for Consult: Right sided weakness and numbness Referring Physician:  Dr. Pearlean Brownie   HPI: [name] is a 37 y.o. non-english speaking female with history of DM, HTN, who was admitted on 01/30/13 with right sided weakness and numbness. Last seen normal night---SBP 200 and CBG 307 at admission. MRI brain with  acute nonhemorrhagic linear infarcts involving the lateral left thalamus and potentially internal capsule and MRA without significant stenosis. 2 D echo with EF 60-65% and no wall abnormalities. Carotid dopplers without significant ICA stenosis. Neurology recommends ASA 81 mg/daily for secondary stroke prevention as well as risk factor modification. Patient continues with right sided weakness with RLE instability poor weight shifting as well as decreased in coordination and blurred vision L>R. MD, PT, OT recommending CIR.    Review of Systems  Constitutional: Positive for malaise/fatigue. Negative for chills.  HENT: Negative for hearing loss.   Respiratory: Negative for cough.   Cardiovascular: Negative for chest pain.  Neurological: Positive for sensory change and focal weakness.   Past Medical History  Diagnosis Date  . Hypertension   . Hypercholesteremia   . Diabetes mellitus without complication    Past Surgical History  Procedure Laterality Date  . Cesarean section     No family history on file.  Social History:  reports that she has never smoked. She does not have any smokeless tobacco history on file. She reports that she does not drink alcohol or use illicit drugs.  Allergies: No Known Allergies  No prescriptions prior to admission    Home: Home Living Family/patient expects to be discharged to:: Private residence Living Arrangements: Children;Other relatives (lives with brother) Available Help at Discharge: Family Type of Home: House Home Access: Stairs to enter Secretary/administrator of Steps: 3 Entrance  Stairs-Rails: Right Home Layout: One level Home Equipment: None  Functional History:   Functional Status:  Mobility: Bed Mobility Bed Mobility: Supine to Sit;Sitting - Scoot to Edge of Bed Supine to Sit: 3: Mod assist Sitting - Scoot to Edge of Bed: 3: Mod assist Transfers Transfers: Sit to Stand;Stand to Sit Sit to Stand: 1: +2 Total assist;From bed;From chair/3-in-1 Sit to Stand: Patient Percentage: 60% Stand to Sit: 1: +2 Total assist;With armrests Stand to Sit: Patient Percentage: 50% Transfer via Lift Equipment: Stedy Ambulation/Gait Ambulation/Gait Assistance: 1: +2 Total assist Ambulation/Gait: Patient Percentage: 40% Ambulation Distance (Feet): 12 Feet Assistive device: 2 person hand held assist Ambulation/Gait Assistance Details: significant assist to maintain patient upright, patient RLE buckling during ambulation, difficulty weight shifting and translating. Poor tolerance for ambulation with easy fatigue. Had to use steady to get from commode back to the chair. Gait Pattern: Decreased dorsiflexion - right;Decreased weight shift to left;Right flexed knee in stance;Shuffle;Scissoring;Trunk flexed Gait velocity: decreased General Gait Details: Patient requires significant assist of 2 person for ambulation attempts    ADL: ADL Eating/Feeding: Minimal assistance Where Assessed - Eating/Feeding: Chair Grooming: Set up;Supervision/safety;Wash/dry hands Where Assessed - Grooming: Supported sitting Upper Body Dressing: Moderate assistance Where Assessed - Upper Body Dressing: Supported sitting Lower Body Dressing: Moderate assistance Where Assessed - Lower Body Dressing: Supported sit to Pharmacist, hospital: +2 Total assistance Toilet Transfer Method: Sit to Barista: Gaffer Method: Not assessed Equipment Used: Gait belt Transfers/Ambulation Related to ADLs: +2 total A (40%) for ambulation. +2 Total (60-50%) for sit  <> stand transfers. ADL Comments: Educated that pt use Rt hand has much as she can during activities. Assist  to hold bowl as she cut up salad. Pt ambulated to bathroom and performed toileting tasks.   Cognition: Cognition Overall Cognitive Status: Difficult to assess Orientation Level: Oriented X4 Cognition Arousal/Alertness: Awake/alert Behavior During Therapy: Impulsive Overall Cognitive Status: Difficult to assess Difficult to assess due to: Non-English speaking  Blood pressure 115/81, pulse 93, temperature 98.4 F (36.9 C), temperature source Oral, resp. rate 16, height 5' (1.524 m), weight 70.2 kg (154 lb 12.2 oz), SpO2 100.00%. Physical Exam  Constitutional: She is oriented to person, place, and time. She appears well-developed and well-nourished.  HENT:  Head: Normocephalic and atraumatic.  Eyes: Conjunctivae and EOM are normal. Pupils are equal, round, and reactive to light.  Neck: No tracheal deviation present. No thyromegaly present.  Cardiovascular: Normal rate.   Respiratory: Effort normal.  Neurological: She is alert and oriented to person, place, and time.  RUE 3-4/5. RLE is 3-4/5.   Psychiatric:  Flat affect    Results for orders placed during the hospital encounter of 01/30/13 (from the past 24 hour(s))  BASIC METABOLIC PANEL     Status: Abnormal   Collection Time    02/01/13  9:35 AM      Result Value Range   Sodium 134 (*) 135 - 145 mEq/L   Potassium 3.6  3.5 - 5.1 mEq/L   Chloride 101  96 - 112 mEq/L   CO2 20  19 - 32 mEq/L   Glucose, Bld 295 (*) 70 - 99 mg/dL   BUN 11  6 - 23 mg/dL   Creatinine, Ser 1.61  0.50 - 1.10 mg/dL   Calcium 9.0  8.4 - 09.6 mg/dL   GFR calc non Af Amer >90  >90 mL/min   GFR calc Af Amer >90  >90 mL/min  CBC     Status: Abnormal   Collection Time    02/01/13  9:35 AM      Result Value Range   WBC 8.2  4.0 - 10.5 K/uL   RBC 5.72 (*) 3.87 - 5.11 MIL/uL   Hemoglobin 16.1 (*) 12.0 - 15.0 g/dL   HCT 04.5  40.9 - 81.1 %   MCV  79.4  78.0 - 100.0 fL   MCH 28.1  26.0 - 34.0 pg   MCHC 35.5  30.0 - 36.0 g/dL   RDW 91.4  78.2 - 95.6 %   Platelets 265  150 - 400 K/uL  GLUCOSE, CAPILLARY     Status: Abnormal   Collection Time    02/01/13 11:33 AM      Result Value Range   Glucose-Capillary 214 (*) 70 - 99 mg/dL  HEPATIC FUNCTION PANEL     Status: Abnormal   Collection Time    02/01/13 12:15 PM      Result Value Range   Total Protein 7.6  6.0 - 8.3 g/dL   Albumin 3.3 (*) 3.5 - 5.2 g/dL   AST 40 (*) 0 - 37 U/L   ALT 65 (*) 0 - 35 U/L   Alkaline Phosphatase 85  39 - 117 U/L   Total Bilirubin 0.7  0.3 - 1.2 mg/dL   Bilirubin, Direct 0.1  0.0 - 0.3 mg/dL   Indirect Bilirubin 0.6  0.3 - 0.9 mg/dL  VITAMIN O13     Status: Abnormal   Collection Time    02/01/13 12:15 PM      Result Value Range   Vitamin B-12 1330 (*) 211 - 911 pg/mL  FOLATE     Status: None  Collection Time    02/01/13 12:15 PM      Result Value Range   Folate >20.0    IRON AND TIBC     Status: None   Collection Time    02/01/13 12:15 PM      Result Value Range   Iron 81  42 - 135 ug/dL   TIBC 409  811 - 914 ug/dL   Saturation Ratios 24  20 - 55 %   UIBC 261  125 - 400 ug/dL  FERRITIN     Status: None   Collection Time    02/01/13 12:15 PM      Result Value Range   Ferritin 46  10 - 291 ng/mL  RETICULOCYTES     Status: Abnormal   Collection Time    02/01/13 12:15 PM      Result Value Range   Retic Ct Pct 2.4  0.4 - 3.1 %   RBC. 6.03 (*) 3.87 - 5.11 MIL/uL   Retic Count, Manual 144.7  19.0 - 186.0 K/uL  GLUCOSE, CAPILLARY     Status: Abnormal   Collection Time    02/01/13  4:24 PM      Result Value Range   Glucose-Capillary 265 (*) 70 - 99 mg/dL   Comment 1 Notify RN    GLUCOSE, CAPILLARY     Status: Abnormal   Collection Time    02/01/13 10:09 PM      Result Value Range   Glucose-Capillary 135 (*) 70 - 99 mg/dL  GLUCOSE, CAPILLARY     Status: Abnormal   Collection Time    02/02/13  6:43 AM      Result Value Range    Glucose-Capillary 183 (*) 70 - 99 mg/dL   Comment 1 Documented in Chart     Comment 2 Notify RN     No results found.  Assessment/Plan: Diagnosis: left thalamic/internal capsule infarct 1. Does the need for close, 24 hr/day medical supervision in concert with the patient's rehab needs make it unreasonable for this patient to be served in a less intensive setting? Yes 2. Co-Morbidities requiring supervision/potential complications: htn, dm 3. Due to bladder management, bowel management, safety, skin/wound care, disease management, medication administration, pain management and patient education, does the patient require 24 hr/day rehab nursing? Yes 4. Does the patient require coordinated care of a physician, rehab nurse, PT (1-2 hrs/day, 5 days/week) and OT (1-2 hrs/day, 5 days/week) to address physical and functional deficits in the context of the above medical diagnosis(es)? Yes Addressing deficits in the following areas: balance, endurance, locomotion, strength, transferring, bowel/bladder control, bathing, dressing, feeding, grooming, toileting and psychosocial support 5. Can the patient actively participate in an intensive therapy program of at least 3 hrs of therapy per day at least 5 days per week? Yes 6. The potential for patient to make measurable gains while on inpatient rehab is excellent 7. Anticipated functional outcomes upon discharge from inpatient rehab are supervision to minimal assist with PT, supervision to minimal assist with OT, n/a with SLP. 8. Estimated rehab length of stay to reach the above functional goals is: 18-24 days 9. Does the patient have adequate social supports to accommodate these discharge functional goals? Yes, potentially 10. Anticipated D/C setting: Home 11. Anticipated post D/C treatments: HH therapy 12. Overall Rehab/Functional Prognosis: good  RECOMMENDATIONS: This patient's condition is appropriate for continued rehabilitative care in the following  setting: CIR Patient has agreed to participate in recommended program. Yes Note that insurance prior authorization may  be required for reimbursement for recommended care.  Comment: Rehab RN to follow up.   Ranelle Oyster, MD, [place] [facility]     02/02/2013

## 2013-02-02 NOTE — Progress Notes (Addendum)
Inpatient Diabetes Program Recommendations  AACE/ADA: New Consensus Statement on Inpatient Glycemic Control (2013)  Target Ranges:  Prepandial:   less than 140 mg/dL      Peak postprandial:   less than 180 mg/dL (1-2 hours)      Critically ill patients:  140 - 180 mg/dL   Reason for Visit: Follow-up regarding Diabetes Coordinator Consult  Note:  Spoke with patient using telephonic interpreter.  Patient states that she thinks she can follow instructions from the dietitian.  States if MD wants her to take insulin injections at home, she will do-- it is up to the doctor to decide.  States she will check her blood sugars at home-- she has done that in the past, but has an old machine and wants to get a new one.  Had planned on trying to get some diabetes materials in [place] but she is unable to read [place]  States brother can read both [place] and [place] so [place] materials can be used.  States brother should be here around 8 in the morning.  Will plan to visit room about 8:30 am to talk with her brother.   Note that nurse held Lantus this morning.  Night nurse held correction for CBG of 183 this morning.  Received Lantus 10 units yesterday with no hypoglycemia noted.   Thank you.  Sophiana Milanese S. Elsie Lincoln, RN, CNS, CDE Inpatient Diabetes Program, team pager [phone]   Addendum:  Reportedly patient has no PCP.  Will need one at discharge.  Is it possible for patient to be followed in the Outpatient Clinic at discharge?  If so, she can be followed by Gavin Pound, RD, CDE.  Otherwise she will need referral for Outpatient Diabetes Education follow-up after she has recovered enough to participate.  Thank you.  Garland Smouse S. Elsie Lincoln, RN, CNS, CDE Inpatient Diabetes Program, team pager [phone]

## 2013-02-02 NOTE — Progress Notes (Addendum)
Occupational Therapy Treatment Patient Details Name: [name] MRN: [id number] DOB: 1975/12/17 Today's Date: 02/02/2013 Time: 4098-1191 OT Time Calculation (min): 18 min  OT Assessment / Plan / Recommendation  History of present illness [name] is a 37 y.o. female, right handed, with a past medical history significant for HTN, DM, and hypercholesterolemia, brought to [facility] ED 01/30/2013 by EMS due to acute onset right sided weakness-numbness. Never had similar symptoms before. She was last seen normal by family at 1 am that day. Then, she went to the bathroom and complained of having weakness and numbness of the right side. Went to bed by approximately 15 minutes expressed to her family that she needed to go to the hospital. SBP 200 and CBG 307 when first evaluated by EMS. No reported spurred speech, vertigo, double vision, difficulty swallowing, imbalance, confusion, language or vision impairment. She did have a HA at home. Upon arrival to ED initial NIHSS 3.. MRI reveals linear acute infarcts involving the lateral left thalamus and potentially internal capsule.    OT comments  Performed toileting tasks and also grooming at sink. Pt performed chair push ups and theraband exercise. Pt requiring +2 Total A to ambulate.   Follow Up Recommendations  CIR;Supervision/Assistance - 24 hour    Barriers to Discharge       Equipment Recommendations  Other (comment) (tbd)    Recommendations for Other Services Rehab consult  Frequency Min 2X/week   Progress towards OT Goals Progress towards OT goals: Progressing toward goals  Plan Discharge plan remains appropriate    Precautions / Restrictions Precautions Precautions: Fall Restrictions Weight Bearing Restrictions: No   Pertinent Vitals/Pain No pain reported.     ADL  Eating/Feeding: Supervision/safety Where Assessed - Eating/Feeding: Chair Grooming: Wash/dry hands;Min guard Where Assessed - Grooming: Supported standing Upper Body Dressing:  Set up;Supervision/safety Where Assessed - Upper Body Dressing: Supported sitting Toilet Transfer: Moderate assistance Toilet Transfer Method: Sit to stand Toilet Transfer Equipment: Raised toilet seat with arms (or 3-in-1 over toilet) Toileting - Clothing Manipulation and Hygiene: Minimal assistance Where Assessed - Toileting Clothing Manipulation and Hygiene: Sit to stand from 3-in-1 or toilet Equipment Used: Gait belt;Other (comment) (theraband) Transfers/Ambulation Related to ADLs: +2 total A (30%) for ambulation and Min A/Mod A for sit <> stand transfers. ADL Comments: Pt performed chair push ups and also UE exercise with theraband. Pt performed gross motor activity at counter and did well.  Educated on dressing technique. Practiced UB dressing with gown and fastening snaps.    OT Diagnosis:    OT Problem List:   OT Treatment Interventions:     OT Goals(current goals can now be found in the care plan section) Acute Rehab OT Goals Patient Stated Goal: none stated OT Goal Formulation: With patient/family Time For Goal Achievement: 02/07/13 Potential to Achieve Goals: Good ADL Goals Pt Will Perform Eating: with set-up;sitting Pt Will Perform Grooming: with set-up;with supervision;standing Pt Will Perform Upper Body Dressing: with set-up;sitting Pt Will Perform Lower Body Dressing: with set-up;with supervision;sit to/from stand Pt Will Transfer to Toilet: with supervision;ambulating;bedside commode Pt Will Perform Toileting - Clothing Manipulation and hygiene: with supervision;sit to/from stand  Visit Information  Last OT Received On: 02/02/13 Assistance Needed: +2 History of Present Illness: [name] is a 37 y.o. female, right handed, with a past medical history significant for HTN, DM, and hypercholesterolemia, brought to [facility] ED 01/30/2013 by EMS due to acute onset right sided weakness-numbness. Never had similar symptoms before. She was last seen normal by  family at 1 am that day.  Then, she went to the bathroom and complained of having weakness and numbness of the right side. Went to bed by approximately 15 minutes expressed to her family that she needed to go to the hospital. SBP 200 and CBG 307 when first evaluated by EMS. No reported spurred speech, vertigo, double vision, difficulty swallowing, imbalance, confusion, language or vision impairment. She did have a HA at home. Upon arrival to ED initial NIHSS 3.. MRI reveals linear acute infarcts involving the lateral left thalamus and potentially internal capsule.     Subjective Data      Prior Functioning       Cognition  Cognition Arousal/Alertness: Awake/alert Behavior During Therapy: WFL for tasks assessed/performed Overall Cognitive Status: Difficult to assess Difficult to assess due to: Non-English speaking    Mobility  Bed Mobility Bed Mobility: Not assessed Transfers Transfers: Sit to Stand;Stand to Sit Sit to Stand: 3: Mod assist;From chair/3-in-1;4: Min assist Stand to Sit: 3: Mod assist;To chair/3-in-1 Details for Transfer Assistance: Mod A from 3 in 1 and Min A from recliner chair for sit to stand.     Exercises         End of Session OT - End of Session Equipment Utilized During Treatment: Gait belt Activity Tolerance: Patient tolerated treatment well Patient left: in chair;with call bell/phone within reach;with family/visitor present  GO     Earlie Raveling OTR/L [phone] 02/02/2013, 2:36 PM

## 2013-02-02 NOTE — Progress Notes (Signed)
I met with patient and her brother, Freida Busman, at bedside. We briefly discussed the need for inpt rehab admission. I will begin insurance approval with [employer] (Obamacare) per pt. Please call me with any questions. [phone]

## 2013-02-02 NOTE — Progress Notes (Signed)
Family Medicine Teaching Service Daily Progress Note Intern Pager: [phone]  Patient name: [name] Medical record number: [id number] Date of birth: 1975/05/17 Age: 37 y.o. Gender: female  Primary Care Provider: No PCP Per Patient Consultants: Neuro Code Status: Full Code  Pt Overview and Major Events to Date:  [name] is a 37 y.o. female presenting with right sided numbness and weakness. PMH is significant for recently diagnosed HTN and DM (not currently treated.)   - 10/24 - Presented to ED; Code Stroke; Evaluated by Neurology and started on ASA - MRI/MRA revealed acute nonhemorrhagic linear infarcts involving the lateral left thalamus and potentially internal capsule - Carotid doppler - Findings consistent with 1 to 39 percent ICA stenosis. Vertebral artery flow antegrade bilaterally. -  Echo - Normal EF with no source of cardiac emboli noted - 10/25 - PT recommending CIR  Assessment and Plan: # Acute CVA with residual R-sided weakness - Neurology recommending ASA 81mg , risk factor modification - Continue statin, ACE - Continue ASA 81 mg - PT/OT recommending CIR.   - PM&R recommends 18-24 days of inpatient rehabilitation followed by home health therapy. Px "excellent" to make measurable gains. Overall functional Px "good."  - Social work consultation for help with options as pt has no insurance and will need PCP on discharge.   # HTN - Lisinopril 5 mg daily - Patient will need outpatient follow up for this  # DM - Moderate SSI; Lantus 10 Units daily - Hold metformin while inpatient, but restart at discharge  - Will also need to be started on Sulfonylurea on D/C   # Transaminase elevation AST 40, ALT 65 - Abdominal U/S pending -  # Polycythemia RBC 6.03, Hgb 16.1 (Hgb 13.9 in March 2013) - Reticulocytes appropriate 2.4% - Fe studies normal; B12 1330, Folate normal - EPO pending  # Hypokalemia - Resolved  FEN/GI: Carb modified diet Prophylaxis: SCD  Disposition:  Pending CIR vs. SNF  Subjective: Pt's brother is used as Nurse, learning disability. No overnight events.  Patient continues to have right sided weakness.  Objective: Temp:  [98 F (36.7 C)-98.6 F (37 C)] 98.4 F (36.9 C) (10/27 0420) Pulse Rate:  [88-110] 93 (10/27 0420) Resp:  [16-20] 16 (10/27 0420) BP: (115-148)/(67-95) 115/81 mmHg (10/27 0420) SpO2:  [97 %-100 %] 100 % (10/27 0420)  Physical Exam: General: well appearing 37yo female in NAD. Cardiovascular: RRR. No murmurs, rubs, or gallops. Respiratory: CTAB. No rales, rhonchi, or wheeze. Abdomen: Normal BS, soft, nontender, nondistended. Extremities: warm, well perfused. No LE edema. Skin: Warm, dry, intact. Neuro: CN II-XII intact. RUE & RLE strength is 3-4/5  Laboratory:  Recent Labs Lab 01/30/13 0640 01/30/13 0644 01/31/13 0545 02/01/13 0935  WBC 7.2  --  9.9 8.2  HGB 15.6* 16.3* 15.8* 16.1*  HCT 43.5 48.0* 43.9 45.4  PLT 276  --  284 265    Recent Labs Lab 01/30/13 0640 01/30/13 0644 01/31/13 0545 02/01/13 0935 02/01/13 1215  NA 136 138 136 134*  --   K 3.4* 3.3* 3.0* 3.6  --   CL 101 103 101 101  --   CO2 21  --  21 20  --   BUN 12 11 16 11   --   CREATININE 0.46* 0.60 0.49* 0.63  --   CALCIUM 8.7  --  8.7 9.0  --   PROT 7.3  --   --   --  7.6  BILITOT 0.4  --   --   --  0.7  ALKPHOS 91  --   --   --  85  ALT 59*  --   --   --  65*  AST 31  --   --   --  40*  GLUCOSE 312* 314* 214* 295*  --    Lipid Panel     Component Value Date/Time   CHOL 240* 01/31/2013 0545   TRIG 129 01/31/2013 0545   HDL 56 01/31/2013 0545   CHOLHDL 4.3 01/31/2013 0545   VLDL 26 01/31/2013 0545   LDLCALC 158* 01/31/2013 0545    Lab Results  Component Value Date   HGBA1C 13.1* 01/30/2013   Imaging/Diagnostic Tests:  Ct Head Wo Contrast IMPRESSION: No acute intracranial abnormalities.    Mr Brain Wo Contrast; MRA 01/30/2013  IMPRESSION: MRI HEAD IMPRESSION  1. Acute nonhemorrhagic linear infarcts involving the lateral  left thalamus and potentially internal capsule. The maximal length is 12 mm. 2. Scattered subcortical T2 hyperintensities are slightly greater than expected for age. 3. Minimal sinus disease.  MRA HEAD IMPRESSION  1. Normal MRA circle of Willis without evidence for significant proximal stenosis, aneurysm, or branch vessel occlusion.    Echo: Study Conclusions Left ventricle: The cavity size was normal. Wall thickness was increased in a pattern of mild LVH. Systolic function was normal. The estimated ejection fraction was in the range of 60% to 65%. Wall motion was normal; there were no regional wall motion abnormalities. Impressions: - No cardiac source of emboli was indentified.  Carotid dopplers: Summary: - Findings consistent with 1 to 39 percent ICA stenosis. Vertebral artery flow antegrade bilaterally.  ULTRASOUND ABDOMEN COMPLETE  IMPRESSION:  Unremarkable abdominal ultrasound.   Hazeline Junker, MD 02/02/2013, 9:04 AM PGY-1, [facility] Family Medicine FPTS Intern pager: [phone], text pages welcome

## 2013-02-02 NOTE — Progress Notes (Signed)
Occupational Therapy Treatment Patient Details Name: Asna Muldrow MRN: [id number] DOB: [birth year] Today's Date: 02/02/2013 Time: 0865-7846 OT Time Calculation (min): 22 min  OT Assessment / Plan / Recommendation  History of present illness Sherryll Swiney is a 37 y.o. female, right handed, with a past medical history significant for HTN, DM, and hypercholesterolemia, brought to [facility] ED 01/30/2013 by EMS due to acute onset right sided weakness-numbness. Never had similar symptoms before. She was last seen normal by family at 1 am that day. Then, she went to the bathroom and complained of having weakness and numbness of the right side. Went to bed by approximately 15 minutes expressed to her family that she needed to go to the hospital. SBP 200 and CBG 307 when first evaluated by EMS. No reported spurred speech, vertigo, double vision, difficulty swallowing, imbalance, confusion, language or vision impairment. She did have a HA at home. Upon arrival to ED initial NIHSS 3.. MRI reveals linear acute infarcts involving the lateral left thalamus and potentially internal capsule.    OT comments  Pt participated in activities to address fine and gross motor coordination. Educated to be using RUE.   Follow Up Recommendations  CIR;Supervision/Assistance - 24 hour    Barriers to Discharge       Equipment Recommendations  Other (comment) (tbd)    Recommendations for Other Services Rehab consult  Frequency Min 2X/week   Progress towards OT Goals Progress towards OT goals: Progressing toward goals  Plan Discharge plan remains appropriate    Precautions / Restrictions Precautions Precautions: Fall Restrictions Weight Bearing Restrictions: No   Pertinent Vitals/Pain No pain reported.     ADL   Toilet Transfer: Minimal assistance Toilet Transfer Method: Sit to stand Toilet Transfer Equipment: Other (comment) (from recliner chair) Transfers/Ambulation Related to ADLs: Mod A for pre-gait and Min A for sit  <> stand transfer.  ADL Comments: Educated to use left hand a lot throughout day. Educated on fine motor actitivies for right hand and had her participate. Pt appears to demonstrate ataxia of RUE.     OT Diagnosis:    OT Problem List:   OT Treatment Interventions:     OT Goals(current goals can now be found in the care plan section) Acute Rehab OT Goals Patient Stated Goal: none stated OT Goal Formulation: With patient/family Time For Goal Achievement: 02/07/13 Potential to Achieve Goals: Good ADL Goals Pt Will Perform Eating: with set-up;sitting Pt Will Perform Grooming: with set-up;with supervision;standing Pt Will Perform Upper Body Dressing: with set-up;sitting Pt Will Perform Lower Body Dressing: with set-up;with supervision;sit to/from stand Pt Will Transfer to Toilet: with supervision;ambulating;bedside commode Pt Will Perform Toileting - Clothing Manipulation and hygiene: with supervision;sit to/from stand Additional ADL Goal #2: Pt will perform fine motor activities with RUE to increase coordination.   Visit Information  Last OT Received On: 02/03/13 Assistance Needed: +2 History of Present Illness: Lasondra Schwarting is a 37 y.o. female, right handed, with a past medical history significant for HTN, DM, and hypercholesterolemia, brought to [facility] ED 01/30/2013 by EMS due to acute onset right sided weakness-numbness. Never had similar symptoms before. She was last seen normal by family at 1 am that day. Then, she went to the bathroom and complained of having weakness and numbness of the right side. Went to bed by approximately 15 minutes expressed to her family that she needed to go to the hospital. SBP 200 and CBG 307 when first evaluated by EMS. No reported spurred speech, vertigo, double vision, difficulty  swallowing, imbalance, confusion, language or vision impairment. She did have a HA at home. Upon arrival to ED initial NIHSS 3.. MRI reveals linear acute infarcts involving the lateral left  thalamus and potentially internal capsule.     Subjective Data      Prior Functioning       Cognition  Cognition Arousal/Alertness: Awake/alert Behavior During Therapy: WFL for tasks assessed/performed Overall Cognitive Status: Difficult to assess Difficult to assess due to: Non-English speaking    Mobility  Bed Mobility Bed Mobility: Not assessed Transfers Transfers: Sit to Stand;Stand to Sit Sit to Stand: 4: Min assist;From chair/3-in-1 Stand to Sit: 4: Min assist;To chair/3-in-1 Details for Transfer Assistance: Mod A from 3 in 1 and Min A from recliner chair for sit to stand.     Exercises         End of Session OT - End of Session Equipment Utilized During Treatment: Gait belt Activity Tolerance: Patient tolerated treatment well Patient left: in chair;with call bell/phone within reach  GO     Earlie Raveling OTR/L [phone] 02/02/2013, 3:02 PM

## 2013-02-02 NOTE — Plan of Care (Addendum)
Problem: Limited Adherence to Nutrition-Related Recommendations (NB-1.6) Goal: Nutrition education Formal process to instruct or train a patient/client in a skill or to impart knowledge to help patients/clients voluntarily manage or modify food choices and eating behavior to maintain or improve health. Outcome: Completed/Met Date Met:  02/02/13  RD consulted for nutrition education regarding diabetes -- RD utilized telephonic interpreter.     Lab Results  Component Value Date    HGBA1C 13.1* 01/30/2013    RD provided "Carbohydrate Counting for People with Diabetes" handout from the [employer] of [employer]. Emphasized carbohydrate portion control at meals, diet/sugar-free beverages and limitation of sweets.  Per diet recall, patient reported she does drink regular orange juice, however, does not typically consume fruit or sweets.  RD spoke with patient's brother (in [place]) who reported patient cook's with a lot of sugar and likes sweets.  RD also encouraged patient to incorporate physical activity/exercise.  Teach back method used.  Expect fair compliance.  Body mass index is 30.23 kg/(m^2). Pt meets criteria for Obesity Class I based on current BMI.  Current diet order is Carbohydrate Modified Medium Calorie, patient is consuming approximately 100% of meals at this time. Labs and medications reviewed. No further nutrition interventions warranted at this time. If additional nutrition issues arise, please re-consult RD.  Maureen Chatters, RD, LDN Pager #: [phone] After-Hours Pager #: [phone]

## 2013-02-02 NOTE — Progress Notes (Signed)
Physical Therapy Treatment Patient Details Name: [name] MRN: [id number] DOB: Aug 25, 1975 Today's Date: 02/02/2013 Time: 4098-1191 PT Time Calculation (min): 17 min  PT Assessment / Plan / Recommendation  History of Present Illness [name] is a 37 y.o. female, right handed, with a past medical history significant for HTN, DM, and hypercholesterolemia, brought to [facility] ED 01/30/2013 by EMS due to acute onset right sided weakness-numbness. Never had similar symptoms before. She was last seen normal by family at 1 am that day. Then, she went to the bathroom and complained of having weakness and numbness of the right side. Went to bed by approximately 15 minutes expressed to her family that she needed to go to the hospital. SBP 200 and CBG 307 when first evaluated by EMS. No reported spurred speech, vertigo, double vision, difficulty swallowing, imbalance, confusion, language or vision impairment. She did have a HA at home. Upon arrival to ED initial NIHSS 3.. MRI reveals linear acute infarcts involving the lateral left thalamus and potentially internal capsule.    PT Comments   Patient demonstrates improvements in ambulation attempts today. Was able to follow commands for RLE quad setting to allow initiation and stride with LLE, overall still requires significant +2 assist during mobility. Will continue to see and progress as tolerated.   Follow Up Recommendations  CIR     Does the patient have the potential to tolerate intense rehabilitation   Yes     Equipment Recommendations  Other (comment) (TBD)    Recommendations for Other Services Rehab consult  Frequency Min 3X/week   Progress towards PT Goals Progress towards PT goals: Progressing toward goals  Plan Current plan remains appropriate    Precautions / Restrictions Precautions Precautions: Fall Restrictions Weight Bearing Restrictions: No   Pertinent Vitals/Pain No pain value given    Mobility  Bed Mobility Bed Mobility: Not  assessed Transfers Transfers: Sit to Stand;Stand to Sit Sit to Stand: 3: Mod assist Stand to Sit: 3: Mod assist Details for Transfer Assistance: Improvements in RLE control today with Cues for patient to 'tighten" muscle Ambulation/Gait Ambulation/Gait Assistance: 1: +2 Total assist Ambulation/Gait: Patient Percentage: 60% Ambulation Distance (Feet): 54 Feet (with one seated rest break) Assistive device: 2 person hand held assist Ambulation/Gait Assistance Details: VCs for RLE quad setting during stnadce to allow for LLE advancement  Gait Pattern: Decreased dorsiflexion - right;Decreased weight shift to left;Right flexed knee in stance;Shuffle;Scissoring;Trunk flexed Gait velocity: decreased General Gait Details: Continues to require significant assist but demonstrates improvements in overall gait Modified Rankin (Stroke Patients Only) Pre-Morbid Rankin Score: No significant disability Modified Rankin: Moderately severe disability    Exercises     PT Diagnosis:    PT Problem List:   PT Treatment Interventions:     PT Goals (current goals can now be found in the care plan section) Acute Rehab PT Goals Patient Stated Goal: none stated PT Goal Formulation: With patient/family Time For Goal Achievement: 02/14/13 Potential to Achieve Goals: Good  Visit Information  Last PT Received On: 02/02/13 Assistance Needed: +2 History of Present Illness: [name] is a 37 y.o. female, right handed, with a past medical history significant for HTN, DM, and hypercholesterolemia, brought to [facility] ED 01/30/2013 by EMS due to acute onset right sided weakness-numbness. Never had similar symptoms before. She was last seen normal by family at 1 am that day. Then, she went to the bathroom and complained of having weakness and numbness of the right side. Went to bed by approximately 15  minutes expressed to her family that she needed to go to the hospital. SBP 200 and CBG 307 when first evaluated by EMS. No  reported spurred speech, vertigo, double vision, difficulty swallowing, imbalance, confusion, language or vision impairment. She did have a HA at home. Upon arrival to ED initial NIHSS 3.. MRI reveals linear acute infarcts involving the lateral left thalamus and potentially internal capsule.     Subjective Data  Patient Stated Goal: none stated   Cognition  Cognition Arousal/Alertness: Awake/alert Behavior During Therapy: WFL for tasks assessed/performed Overall Cognitive Status: Difficult to assess Difficult to assess due to: Non-English speaking    Balance  Balance Balance Assessed: Yes Static Sitting Balance Static Sitting - Balance Support: Feet supported Static Sitting - Level of Assistance: 5: Stand by assistance  End of Session PT - End of Session Equipment Utilized During Treatment: Gait belt Activity Tolerance: Patient limited by fatigue Patient left: in chair;with call bell/phone within reach;with family/visitor present Nurse Communication: Mobility status;Need for lift equipment   GP     Fabio Asa 02/02/2013, 1:11 PM Charlotte Crumb, PT DPT  [phone]

## 2013-02-03 ENCOUNTER — Inpatient Hospital Stay (HOSPITAL_COMMUNITY)
Admission: RE | Admit: 2013-02-03 | Discharge: 2013-02-16 | DRG: 945 | Disposition: A | Discharge status: Home / Self Care | Payer: BC Managed Care – PPO | Source: Intra-hospital | Attending: Physical Medicine & Rehabilitation | Admitting: Physical Medicine & Rehabilitation

## 2013-02-03 DIAGNOSIS — R531 Weakness: Secondary | ICD-10-CM

## 2013-02-03 DIAGNOSIS — I6330 Cerebral infarction due to thrombosis of unspecified cerebral artery: Secondary | ICD-10-CM

## 2013-02-03 DIAGNOSIS — I10 Essential (primary) hypertension: Secondary | ICD-10-CM | POA: Diagnosis present

## 2013-02-03 DIAGNOSIS — E1142 Type 2 diabetes mellitus with diabetic polyneuropathy: Secondary | ICD-10-CM | POA: Diagnosis present

## 2013-02-03 DIAGNOSIS — E7800 Pure hypercholesterolemia, unspecified: Secondary | ICD-10-CM | POA: Diagnosis present

## 2013-02-03 DIAGNOSIS — Z8673 Personal history of transient ischemic attack (TIA), and cerebral infarction without residual deficits: Secondary | ICD-10-CM

## 2013-02-03 DIAGNOSIS — G8190 Hemiplegia, unspecified affecting unspecified side: Secondary | ICD-10-CM | POA: Diagnosis present

## 2013-02-03 DIAGNOSIS — I639 Cerebral infarction, unspecified: Secondary | ICD-10-CM

## 2013-02-03 DIAGNOSIS — Z794 Long term (current) use of insulin: Secondary | ICD-10-CM

## 2013-02-03 DIAGNOSIS — E119 Type 2 diabetes mellitus without complications: Secondary | ICD-10-CM

## 2013-02-03 DIAGNOSIS — Z5189 Encounter for other specified aftercare: Principal | ICD-10-CM

## 2013-02-03 DIAGNOSIS — I6350 Cerebral infarction due to unspecified occlusion or stenosis of unspecified cerebral artery: Secondary | ICD-10-CM | POA: Diagnosis present

## 2013-02-03 DIAGNOSIS — E1149 Type 2 diabetes mellitus with other diabetic neurological complication: Secondary | ICD-10-CM | POA: Diagnosis present

## 2013-02-03 LAB — SEDIMENTATION RATE: Sed Rate: 8 mm/hr (ref 0–22)

## 2013-02-03 LAB — GLUCOSE, CAPILLARY
Glucose-Capillary: 183 mg/dL — ABNORMAL HIGH (ref 70–99)
Glucose-Capillary: 235 mg/dL — ABNORMAL HIGH (ref 70–99)
Glucose-Capillary: 229 mg/dL — ABNORMAL HIGH (ref 70–99)
Glucose-Capillary: 236 mg/dL — ABNORMAL HIGH (ref 70–99)

## 2013-02-03 LAB — HEPATITIS PANEL, ACUTE
HCV Ab: REACTIVE — AB
Hep A IgM: NONREACTIVE
Hep B C IgM: NONREACTIVE
Hepatitis B Surface Ag: NEGATIVE

## 2013-02-03 MED ORDER — HYDROCODONE-ACETAMINOPHEN 5-325 MG PO TABS
1.0000 | ORAL_TABLET | Freq: Once | ORAL | Status: AC
  Administered 2013-02-03: 1 via ORAL
  Filled 2013-02-03: qty 1

## 2013-02-03 MED ORDER — ONDANSETRON HCL 4 MG PO TABS
4.0000 mg | ORAL_TABLET | Freq: Four times a day (QID) | ORAL | Status: DC | PRN
  Administered 2013-02-10: 4 mg via ORAL
  Filled 2013-02-03: qty 1

## 2013-02-03 MED ORDER — ACETAMINOPHEN 325 MG PO TABS
325.0000 mg | ORAL_TABLET | ORAL | Status: DC | PRN
  Administered 2013-02-04 – 2013-02-15 (×7): 650 mg via ORAL
  Filled 2013-02-03 (×8): qty 2

## 2013-02-03 MED ORDER — ATORVASTATIN CALCIUM 40 MG PO TABS
40.0000 mg | ORAL_TABLET | Freq: Every day | ORAL | Status: DC
  Administered 2013-02-04 – 2013-02-15 (×12): 40 mg via ORAL
  Filled 2013-02-03 (×13): qty 1

## 2013-02-03 MED ORDER — INSULIN ASPART 100 UNIT/ML ~~LOC~~ SOLN
0.0000 [IU] | Freq: Three times a day (TID) | SUBCUTANEOUS | Status: DC
  Administered 2013-02-04: 3 [IU] via SUBCUTANEOUS
  Administered 2013-02-04 – 2013-02-05 (×3): 5 [IU] via SUBCUTANEOUS
  Administered 2013-02-05: 3 [IU] via SUBCUTANEOUS
  Administered 2013-02-05: 8 [IU] via SUBCUTANEOUS
  Administered 2013-02-06: 3 [IU] via SUBCUTANEOUS
  Administered 2013-02-06: 2 [IU] via SUBCUTANEOUS
  Administered 2013-02-06: 8 [IU] via SUBCUTANEOUS
  Administered 2013-02-07 (×2): 2 [IU] via SUBCUTANEOUS
  Administered 2013-02-08 (×3): 3 [IU] via SUBCUTANEOUS
  Administered 2013-02-09 – 2013-02-10 (×3): 2 [IU] via SUBCUTANEOUS
  Administered 2013-02-10: 3 [IU] via SUBCUTANEOUS
  Administered 2013-02-11 – 2013-02-12 (×2): 2 [IU] via SUBCUTANEOUS
  Administered 2013-02-13: 3 [IU] via SUBCUTANEOUS
  Administered 2013-02-15 (×3): 2 [IU] via SUBCUTANEOUS

## 2013-02-03 MED ORDER — SORBITOL 70 % SOLN
30.0000 mL | Freq: Every day | Status: DC | PRN
  Administered 2013-02-09: 30 mL via ORAL
  Filled 2013-02-03: qty 30

## 2013-02-03 MED ORDER — LISINOPRIL 5 MG PO TABS
5.0000 mg | ORAL_TABLET | Freq: Every day | ORAL | Status: DC
  Administered 2013-02-04 – 2013-02-16 (×13): 5 mg via ORAL
  Filled 2013-02-03 (×14): qty 1

## 2013-02-03 MED ORDER — INSULIN GLARGINE 100 UNIT/ML ~~LOC~~ SOLN
10.0000 [IU] | Freq: Every day | SUBCUTANEOUS | Status: DC
  Administered 2013-02-04 – 2013-02-05 (×2): 10 [IU] via SUBCUTANEOUS
  Filled 2013-02-03 (×2): qty 0.1

## 2013-02-03 MED ORDER — ASPIRIN EC 81 MG PO TBEC
81.0000 mg | DELAYED_RELEASE_TABLET | Freq: Every day | ORAL | Status: DC
  Administered 2013-02-04 – 2013-02-16 (×13): 81 mg via ORAL
  Filled 2013-02-03 (×14): qty 1

## 2013-02-03 MED ORDER — ONDANSETRON HCL 4 MG/2ML IJ SOLN: 4.0000 mg | Freq: Four times a day (QID) | INTRAMUSCULAR | Status: DC | PRN

## 2013-02-03 NOTE — H&P (Signed)
Physical Medicine and Rehabilitation Admission H&P    Chief Complaint  Patient presents with  . Code Stroke  : HPI: [name] is a 37 y.o. non-english speaking female with history of DM, HTN, who was admitted on 01/30/13 with right sided weakness and numbness. Last seen normal night---SBP 200 and CBG 307 at admission. MRI brain with acute nonhemorrhagic linear infarcts involving the lateral left thalamus and potentially internal capsule and MRA without significant stenosis. 2 D echo with EF 60-65% and no wall abnormalities. Carotid dopplers without significant ICA stenosis. Neurology recommends ASA 81 mg/daily for secondary stroke prevention as well as risk factor modification. Patient continues with right sided weakness with RLE instability poor weight shifting as well as decreased in coordination and blurred vision L>R. MD, PT, OT recommending CIR. patient was felt to be a good candidate for inpatient rehabilitation services was admitted for comprehensive rehabilitation program   Review of Systems  Constitutional: Negative for fever and chills.  Eyes: Negative for blurred vision and double vision.  Respiratory: Negative for cough.   Cardiovascular: Negative for chest pain and palpitations.  Gastrointestinal: Negative for nausea and vomiting.  Musculoskeletal: Positive for joint pain.  Neurological: Positive for tingling, sensory change and focal weakness.   Review of Systems  Constitutional: Positive for malaise/fatigue. Negative for chills.  HENT: Negative for hearing loss.  Respiratory: Negative for cough.  Cardiovascular: Negative for chest pain.  Neurological: Positive for sensory change and focal weakness  Past Medical History  Diagnosis Date  . Hypertension   . Hypercholesteremia   . Diabetes mellitus without complication    Past Surgical History  Procedure Laterality Date  . Cesarean section     No family history on file. Social History:  reports that she has never  smoked. She does not have any smokeless tobacco history on file. She reports that she does not drink alcohol or use illicit drugs. Allergies: No Known Allergies No prescriptions prior to admission    Home: Home Living Family/patient expects to be discharged to:: Private residence Living Arrangements: Children;Other relatives (lives with brother) Available Help at Discharge: Family Type of Home: House Home Access: Stairs to enter Secretary/administrator of Steps: 3 Entrance Stairs-Rails: Right Home Layout: One level Home Equipment: None   Functional History:    Functional Status:  Mobility: Bed Mobility Bed Mobility: Not assessed Supine to Sit: 3: Mod assist Sitting - Scoot to Edge of Bed: 3: Mod assist Transfers Transfers: Sit to Stand;Stand to Sit Sit to Stand: 4: Min assist;From chair/3-in-1 Sit to Stand: Patient Percentage: 60% Stand to Sit: 4: Min assist;To chair/3-in-1 Stand to Sit: Patient Percentage: 50% Transfer via Lift Equipment: Stedy Ambulation/Gait Ambulation/Gait Assistance: 1: +2 Total assist Ambulation/Gait: Patient Percentage: 60% Ambulation Distance (Feet): 54 Feet (with one seated rest break) Assistive device: 2 person hand held assist Ambulation/Gait Assistance Details: VCs for RLE quad setting during stnadce to allow for LLE advancement  Gait Pattern: Decreased dorsiflexion - right;Decreased weight shift to left;Right flexed knee in stance;Shuffle;Scissoring;Trunk flexed Gait velocity: decreased General Gait Details: Continues to require significant assist but demonstrates improvements in overall gait    ADL: ADL Eating/Feeding: Supervision/safety Where Assessed - Eating/Feeding: Chair Grooming: Wash/dry hands;Min guard Where Assessed - Grooming: Supported standing Upper Body Dressing: Set up;Supervision/safety Where Assessed - Upper Body Dressing: Supported sitting Lower Body Dressing: Moderate assistance Where Assessed - Lower Body Dressing:  Supported sit to stand Toilet Transfer: Minimal assistance Toilet Transfer Method: Sit to Barista: Other (comment) (from  recliner chair) Tub/Shower Transfer Method: Not assessed Equipment Used: Gait belt;Other (comment) (theraband) Transfers/Ambulation Related to ADLs: Mod A for pre-gait and Min A for sit <> stand transfer.  ADL Comments: Educated to use left hand a lot throughout day. Educated on fine motor actitivies for right hand and had her participate. Pt appears to demonstrate ataxia of RUE.   Cognition: Cognition Overall Cognitive Status: Difficult to assess Orientation Level: Oriented X4 Cognition Arousal/Alertness: Awake/alert Behavior During Therapy: WFL for tasks assessed/performed Overall Cognitive Status: Difficult to assess Difficult to assess due to: Non-English speaking     Blood pressure 110/80, pulse 89, temperature 97.5 F (36.4 C), temperature source Oral, resp. rate 18, height 5' (1.524 m), weight 70.2 kg (154 lb 12.2 oz), SpO2 100.00%. Physical Exam Constitutional: She is oriented to person, place, and time. She appears well-developed and well-nourished.  HENT:  Head: Normocephalic and atraumatic.  Eyes: Conjunctivae and EOM are normal. Pupils are equal, round, and reactive to light.  Neck: No tracheal deviation present. No thyromegaly present.  Cardiovascular: Normal rate. Regular rhythm. Right leg with mild calf tenderness, ?mild edema. Homans sign- Respiratory: Effort normal.  Neurological: She is alert and oriented to person, place, and time. Has good insight and awareness. Answered all basic questions relatively appropriate despite language issues. Good sitting balance. Speech generally clear.  RUE 3+ to 4 deltoid, bicep, tricep, wrist, HI, RLE 3+HF, 4-KE, 4 ADF/APF. Sensory 1 to 1+/2 right face, arm, leg.  Psychiatric:  Flat affect but generally appropriate.   Results for orders placed during the hospital encounter of 01/30/13  (from the past 48 hour(s))  BASIC METABOLIC PANEL     Status: Abnormal   Collection Time    02/01/13  9:35 AM      Result Value Range   Sodium 134 (*) 135 - 145 mEq/L   Potassium 3.6  3.5 - 5.1 mEq/L   Chloride 101  96 - 112 mEq/L   CO2 20  19 - 32 mEq/L   Glucose, Bld 295 (*) 70 - 99 mg/dL   BUN 11  6 - 23 mg/dL   Creatinine, Ser 1.61  0.50 - 1.10 mg/dL   Calcium 9.0  8.4 - 09.6 mg/dL   GFR calc non Af Amer >90  >90 mL/min   GFR calc Af Amer >90  >90 mL/min   Comment: (NOTE)     The eGFR has been calculated using the CKD EPI equation.     This calculation has not been validated in all clinical situations.     eGFR's persistently <90 mL/min signify possible Chronic Kidney     Disease.  CBC     Status: Abnormal   Collection Time    02/01/13  9:35 AM      Result Value Range   WBC 8.2  4.0 - 10.5 K/uL   RBC 5.72 (*) 3.87 - 5.11 MIL/uL   Hemoglobin 16.1 (*) 12.0 - 15.0 g/dL   HCT 04.5  40.9 - 81.1 %   MCV 79.4  78.0 - 100.0 fL   MCH 28.1  26.0 - 34.0 pg   MCHC 35.5  30.0 - 36.0 g/dL   RDW 91.4  78.2 - 95.6 %   Platelets 265  150 - 400 K/uL  GLUCOSE, CAPILLARY     Status: Abnormal   Collection Time    02/01/13 11:33 AM      Result Value Range   Glucose-Capillary 214 (*) 70 - 99 mg/dL  HEPATIC FUNCTION PANEL  Status: Abnormal   Collection Time    02/01/13 12:15 PM      Result Value Range   Total Protein 7.6  6.0 - 8.3 g/dL   Albumin 3.3 (*) 3.5 - 5.2 g/dL   AST 40 (*) 0 - 37 U/L   ALT 65 (*) 0 - 35 U/L   Alkaline Phosphatase 85  39 - 117 U/L   Total Bilirubin 0.7  0.3 - 1.2 mg/dL   Bilirubin, Direct 0.1  0.0 - 0.3 mg/dL   Indirect Bilirubin 0.6  0.3 - 0.9 mg/dL  ERYTHROPOIETIN     Status: None   Collection Time    02/01/13 12:15 PM      Result Value Range   Erythropoietin 15.4  2.6 - 18.5 mIU/mL   Comment: (NOTE)     Because of diurnal variations in Erythropoietin levels, it is     important to collect samples at a consistent time of day.  Morning     samples  collected between 7:30 AM and 12:00 noon are recommended.     Performed at [facility]  VITAMIN B12     Status: Abnormal   Collection Time    02/01/13 12:15 PM      Result Value Range   Vitamin B-12 1330 (*) 211 - 911 pg/mL   Comment: Performed at [facility]  FOLATE     Status: None   Collection Time    02/01/13 12:15 PM      Result Value Range   Folate >20.0     Comment: (NOTE)     Reference Ranges            Deficient:       0.4 - 3.3 ng/mL            Indeterminate:   3.4 - 5.4 ng/mL            Normal:              > 5.4 ng/mL     Performed at [facility]  IRON AND TIBC     Status: None   Collection Time    02/01/13 12:15 PM      Result Value Range   Iron 81  42 - 135 ug/dL   TIBC 914  782 - 956 ug/dL   Saturation Ratios 24  20 - 55 %   UIBC 261  125 - 400 ug/dL   Comment: Performed at [facility]  FERRITIN     Status: None   Collection Time    02/01/13 12:15 PM      Result Value Range   Ferritin 46  10 - 291 ng/mL   Comment: Performed at [facility]  RETICULOCYTES     Status: Abnormal   Collection Time    02/01/13 12:15 PM      Result Value Range   Retic Ct Pct 2.4  0.4 - 3.1 %   RBC. 6.03 (*) 3.87 - 5.11 MIL/uL   Retic Count, Manual 144.7  19.0 - 186.0 K/uL  GLUCOSE, CAPILLARY     Status: Abnormal   Collection Time    02/01/13  4:24 PM      Result Value Range   Glucose-Capillary 265 (*) 70 - 99 mg/dL   Comment 1 Notify RN    GLUCOSE, CAPILLARY     Status: Abnormal   Collection Time    02/01/13 10:09 PM      Result Value Range  Glucose-Capillary 135 (*) 70 - 99 mg/dL  GLUCOSE, CAPILLARY     Status: Abnormal   Collection Time    02/02/13  6:43 AM      Result Value Range   Glucose-Capillary 183 (*) 70 - 99 mg/dL   Comment 1 Documented in Chart     Comment 2 Notify RN    GLUCOSE, CAPILLARY     Status: Abnormal   Collection Time    02/02/13 11:28 AM      Result Value Range   Glucose-Capillary 277 (*) 70 - 99  mg/dL  GLUCOSE, CAPILLARY     Status: Abnormal   Collection Time    02/02/13  4:40 PM      Result Value Range   Glucose-Capillary 259 (*) 70 - 99 mg/dL   Comment 1 Notify RN    GLUCOSE, CAPILLARY     Status: Abnormal   Collection Time    02/02/13 10:16 PM      Result Value Range   Glucose-Capillary 160 (*) 70 - 99 mg/dL  GLUCOSE, CAPILLARY     Status: Abnormal   Collection Time    02/03/13  6:35 AM      Result Value Range   Glucose-Capillary 183 (*) 70 - 99 mg/dL   Comment 1 Documented in Chart     Comment 2 Notify RN     US Abdomen Complete  02/02/2013   CLINICAL DATA:  Elevated LFTs. Evaluate for fatty liver.  EXAM: ULTRASOUND ABDOMEN COMPLETE  COMPARISON:  None.  FINDINGS: Gallbladder  No gallstones or wall thickening visualized. No sonographic Murphy sign noted.  Common bile duct  Diameter: Normal caliber, 2 mm.  Liver  No focal lesion identified. Within normal limits in parenchymal echogenicity.  IVC  No abnormality visualized.  Pancreas  Visualized portion unremarkable.  Spleen  Size and appearance within normal limits.  Right Kidney  Length: 10.2 cm. Echogenicity within normal limits. No mass or hydronephrosis visualized.  Left Kidney  Length: 10.1 cm. Echogenicity within normal limits. No mass or hydronephrosis visualized.  Abdominal aorta  No aneurysm visualized.  IMPRESSION: Unremarkable abdominal ultrasound.   Electronically Signed   By: Charlett Nose M.D.   On: 02/02/2013 09:12    Post Admission Physician Evaluation: 1. Functional deficits secondary  to  Thrombotic left thalamus, internal capsule infarcts with right hemiparesis and hemisensory deficits. 2. Patient is admitted to receive collaborative, interdisciplinary care between the physiatrist, rehab nursing staff, and therapy team. 3. Patient's level of medical complexity and substantial therapy needs in context of that medical necessity cannot be provided at a lesser intensity of care such as a SNF. 4. Patient has  experienced substantial functional loss from his/her baseline which was documented above under the "Functional History" and "Functional Status" headings.  Judging by the patient's diagnosis, physical exam, and functional history, the patient has potential for functional progress which will result in measurable gains while on inpatient rehab.  These gains will be of substantial and practical use upon discharge  in facilitating mobility and self-care at the household level. 5. Physiatrist will provide 24 hour management of medical needs as well as oversight of the therapy plan/treatment and provide guidance as appropriate regarding the interaction of the two. 6. 24 hour rehab nursing will assist with bladder management, bowel management, safety, skin/wound care, disease management, medication administration, pain management and patient education  and help integrate therapy concepts, techniques,education, etc. 7. PT will assess and treat for/with: Lower extremity strength, range of motion, stamina,  balance, functional mobility, safety, adaptive techniques and equipment, NMR, education.   Goals are: supervision to mod I. 8. OT will assess and treat for/with: ADL's, functional mobility, safety, upper extremity strength, adaptive techniques and equipment, NMR, education.   Goals are: mod I to set up. 9. SLP will assess and treat for/with: n/a.  Goals are: n/a. 10. Case Management and Social Worker will assess and treat for psychological issues and discharge planning. 11. Team conference will be held weekly to assess progress toward goals and to determine barriers to discharge. 12. Patient will receive at least 3 hours of therapy per day at least 5 days per week. 13. ELOS: 11-12 days       14. Prognosis:  excellent   Medical Problem List and Plan: 1. Left thalamic internal capsule infarct likely due to small vessel disease 2. DVT Prophylaxis/Anticoagulation: SCDs. Monitor for any signs of DVT and 3. Pain  Management: Tylenol as needed and 4. Neuropsych: This patient is capable of making decisions on her own behalf. 5. Hypertension. Lisinopril 5 mg daily. Monitor with increased activity 6. Diabetes mellitus with peripheral neuropathy. Hemoglobin A1c 13.1.lantus insulin 10 units daily. Check blood sugars a.c. and at bedtime  Ranelle Oyster, MD, [facility] Physical Medicine & Rehabilitation   02/03/2013

## 2013-02-03 NOTE — PMR Pre-admission (Signed)
PMR Admission Coordinator Pre-Admission Assessment  Patient: [name] is an 37 y.o., female MRN: [id number] DOB: 15-Mar-1976 Height: 5' (152.4 cm) Weight: 70.2 kg (154 lb 12.2 oz)              Insurance Information HMO:     PPO: yes     PCP:      IPA:      80/20:      OTHER:  PRIMARY: [employer] [place]      Policy#: [id number]      Subscriber: pt CM Name: Lucrezia Europe      Phone#: [phone] ext [phone]     Fax#: [phone] Pre-Cert#: [id number]      Employer: Obamacare cert 64/33- 29/5/18 Benefits:  Phone #: [phone]     Name: 10/28 Eff. 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  Emergency Contact Information Contact Information   Name Relation Home Work Mobile   Marchi,Allen Brother   [phone]   Oats,King Brother [phone]       Current Medical History  Patient Admitting Diagnosis: left thalamic/internal capsule infarct  History of Present Illness: Auna Mikkelsen is a 37 y.o. non-english speaking female with history of DM, HTN, who was admitted on 01/30/13 with right sided weakness and numbness. Last seen normal night---SBP 200 and CBG 307 at admission. MRI brain with acute nonhemorrhagic linear infarcts involving the lateral left thalamus and potentially internal capsule and MRA without significant stenosis. 2 D echo with EF 60-65% and no wall abnormalities. Carotid dopplers without significant ICA stenosis. Neurology recommends ASA 81 mg/daily for secondary stroke prevention as well as risk factor modification. Patient continues with right sided weakness with RLE instability poor weight  shifting as well as decreased in coordination and blurred vision L>R.    Patient new for untreated HTN and Diabetes. Will need to determine insulin vs oral regimen. HTN treat with Lisinopril. Hgb A1 C 13.1   Total: 5 NIH    Past Medical History  Past Medical History  Diagnosis Date  . Hypertension   . Hypercholesteremia   . Diabetes mellitus without complication     Family History  family history is not on file. Mom is 35 yo and had CVA 12 years ago per pt  Prior Rehab/Hospitalizations: none  Current Medications  Current facility-administered medications:acetaminophen (TYLENOL) tablet 650 mg, 650 mg, Oral, Q6H PRN, Tobey Grim, MD, 650 mg at 02/02/13 2224;  aspirin EC tablet 81 mg, 81 mg, Oral, Daily, Wyatt Portela, MD, 81 mg at 02/03/13 1017;  atorvastatin (LIPITOR) tablet 40 mg, 40 mg, Oral, q1800, Tobey Grim, MD, 40 mg at 02/02/13 1900;  cyclobenzaprine (FLEXERIL) tablet 5 mg, 5 mg, Oral, TID PRN, Tobey Grim, MD, 5 mg at 02/01/13 2211 hydrALAZINE (APRESOLINE) injection 5 mg, 5 mg, Intravenous, Q4H PRN, Amber M Hairford, MD;  insulin aspart (novoLOG) injection 0-15 Units, 0-15 Units,  Subcutaneous, TID WC, Ozella Rocks, MD, 5 Units at 02/03/13 1216;  insulin aspart (novoLOG) injection 0-5 Units, 0-5 Units, Subcutaneous, QHS, Ozella Rocks, MD, 3 Units at 01/30/13 2252 insulin glargine (LANTUS) injection 10 Units, 10 Units, Subcutaneous, Daily, Tommie Sams, DO, 10 Units at 02/03/13 1014;  lisinopril (PRINIVIL,ZESTRIL) tablet 5 mg, 5 mg, Oral, Daily, Jayce G Cook, DO, 5 mg at 02/03/13 1017;  sodium chloride 0.9 % injection 3 mL, 3 mL, Intravenous, Q12H, Hilarie Fredrickson, MD, 3 mL at 02/02/13 2224  Patients Current Diet: Carb Control  Precautions / Restrictions Precautions Precautions: Fall Restrictions Weight Bearing Restrictions: No   Prior Activity Level Community (5-7x/wk): 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 In [place] for 15 year) Orientation Level: Oriented X4    Extremity Assessment (includes Sensation/Coordination)          ADLs  Eating/Feeding: Supervision/safety Where Assessed - Eating/Feeding: Chair Grooming: Wash/dry hands;Min guard Where Assessed - Grooming: Supported standing Upper Body Dressing: Set up;Supervision/safety Where Assessed - Upper Body Dressing: Supported sitting Lower Body Dressing: Moderate assistance Where Assessed - Lower Body Dressing: Supported sit to Pharmacist, hospital: Minimal Dentist: Patient Percentage: 60% Statistician Method: Sit to Barista: Other (comment) (from recliner chair) Toileting - Clothing Manipulation and Hygiene: Minimal assistance Toileting - Clothing Manipulation and Hygiene: Patient Percentage: 50% Where Assessed - Toileting Clothing Manipulation and Hygiene: Sit to stand from 3-in-1 or toilet Tub/Shower Transfer Method: Not assessed Equipment Used: Gait belt;Other (comment) (theraband) Transfers/Ambulation Related to ADLs: Mod A for pre-gait and Min A for sit <> stand transfer.  ADL Comments: Educated to use left hand a lot throughout day. Educated on fine motor actitivies for right hand and had her participate. Pt appears to demonstrate ataxia of RUE.     Mobility  Bed Mobility: Not assessed Supine to Sit: 3: Mod assist Sitting - Scoot to Edge of Bed: 3: Mod assist    Transfers  Transfers: Sit to Stand;Stand to Sit Sit to Stand: 4: Min assist;From chair/3-in-1 Sit to Stand: Patient Percentage:  60% Stand to Sit: 4: Min assist;To chair/3-in-1 Stand to Sit: Patient Percentage: 50% Transfer via Lift Equipment: Stedy    Ambulation / Gait / Stairs / Psychologist, prison and probation services  Ambulation/Gait Ambulation/Gait Assistance: 1: +2 Total assist Ambulation/Gait: Patient Percentage: 60% Ambulation Distance (Feet): 54 Feet (with one seated rest break) Assistive device: 2 person hand held assist Ambulation/Gait Assistance Details: VCs for RLE quad setting during stnadce to allow for LLE advancement  Gait Pattern: Decreased dorsiflexion - right;Decreased weight shift to left;Right flexed knee in stance;Shuffle;Scissoring;Trunk flexed Gait velocity: decreased General Gait Details: Continues to require significant assist but demonstrates improvements in overall gait    Posture / Balance Static Sitting Balance Static Sitting - Balance Support: Feet supported Static Sitting - Level of Assistance: 5: Stand by assistance    Special needs/care consideration bowel mgmt: continent Bladder mgmt: continent Diabetic mgmt Hgb A1C is 13.1   Previous Home Environment Living Arrangements: Children;Parent;Other relatives (Lives with younger brother, 17 yo Mom, her 48 yo and 80 yo)  Lives With: Family  Available Help at Discharge: Family Type of Home: House Home Layout: One level Home Access: Stairs to enter Entrance Stairs-Rails: Right Entrance Stairs-Number of Steps: 3 Bathroom Shower/Tub: Forensic scientist: Standard Bathroom Accessibility: Yes How Accessible: Accessible via walker Home Care Services: No  Discharge Living Setting Plans for Discharge Living Setting: Patient's home;Lives with (comment);Other (Comment) (brother, 73 yo Mom, 54 and 49 yo children) Type of Home at Discharge: House Discharge Home Layout: One level Discharge Home Access: Stairs to enter Entrance Stairs-Rails: Right Entrance Stairs-Number of Steps: 3 steps Discharge Bathroom Shower/Tub: Tub/shower  unit;Curtain Discharge Bathroom Toilet: Standard Discharge Bathroom Accessibility: Yes How Accessible: Accessible via walker Does the patient have any problems obtaining your medications?: No  Social/Family/Support Systems Patient Roles: Advertising account executive;Other (Comment) (employee) Contact Information: Phillips Hay, brother Anticipated Caregiver: family prn Anticipated Caregiver's Contact Information: as above. Freida Busman speaks English well Ability/Limitations of Caregiver: Freida Busman works. [name] brother works 1430 until late. Caregiver Availability: Other (Comment) (KIng leaves for work at 1430. 43 yo daughter gets home from ) Discharge Plan Discussed with Primary Caregiver: Yes Is Caregiver In Agreement with Plan?: Yes Does Caregiver/Family have Issues with Lodging/Transportation while Pt is in Rehab?: No    Goals/Additional Needs Patient/Family Goal for Rehab: supervision to min assist PT and OT Expected length of stay: ELOS 18-24 days Special Service Needs: [place] translator arranged for 10/29 9 am until 12 noon for evals Pt/Family Agrees to Admission and willing to participate: Yes Program Orientation Provided & Reviewed with Pt/Caregiver Including Roles  & Responsibilities: Yes   Decrease burden of Care through IP rehab admission: Othern/a   Possible need for SNF placement upon discharge:not anticipated  Patient Condition: This patient's condition remains as documented in the consult dated 02/02/13, in which the Rehabilitation Physician determined and documented that the patient's condition is appropriate for intensive rehabilitative care in an inpatient rehabilitation facility. Will admit to inpatient rehab today.  Preadmission Screen Completed By:  Clois Dupes, 02/03/2013 3:31 PM ______________________________________________________________________   Discussed status with Dr. Riley Kill on 02/03/13 at 1530 and received telephone approval for admission today.  Admission  Coordinator:  Clois Dupes, time 1610 Date 02/03/13.

## 2013-02-03 NOTE — Progress Notes (Signed)
I met with patient with a translator at bedside. Discussed home support and plans to admit her to inpt rehab today. She is in agreement. I will arrange for today. [phone]

## 2013-02-03 NOTE — Progress Notes (Signed)
I have arranged for a translator, "Dock", to be present 9 am until 12 noon tomorrow to assist for pt's initial evaluations. [phone]

## 2013-02-03 NOTE — Progress Notes (Signed)
FMTS Attending Daily Note: Traycen Goyer MD [phone] pager office [phone] I  have seen and examined this patient, reviewed their chart. I have discussed this patient with the resident. I agree with the resident's findings, assessment and care plan. 

## 2013-02-03 NOTE — Progress Notes (Addendum)
Inpatient Diabetes Program Recommendations  AACE/ADA: New Consensus Statement on Inpatient Glycemic Control (2013)  Target Ranges:  Prepandial:   less than 140 mg/dL      Peak postprandial:   less than 180 mg/dL (1-2 hours)      Critically ill patients:  140 - 180 mg/dL   Reason for Visit: Follow-up with family and patient regarding new-onset diabetes  Note:  Lengthy visit to patient.  Met with her two brothers and sister-in-law.  Explained basic metabolic issues related to diabetes.  (Used the illustration of insulin being key to unlock door of body cells so glucose can enter for energy.  Insulin resistance causes a problems with the lock causing key not to fit properly.)  Explained that basic treatment for type 2 diabetes may include both insulin and pills and treatment plan can change based on many factors-- so very important to keep physician appts so physician can continually reevaluate needs and treatment plan.  Stressed that the only way she can really know day to day how her diabetes control is would be to test her blood sugar by sticking her finger,  ie cannot go by the way she feels.  Used teach back method.  Brothers talked to patient and none of them had any questions right now.  Explained to brothers how to access the Patient Education Network.  Suggested they play videos while they are here for patient to watch with them.  Patient lives with the younger brother.  Older brother and sister-in-law live separately.  Gave both brothers an basic diabetes instructional DVD to take home to play on their computer.  Suggested they write down questions as they think of them to ask the health care team when they visit.  Explained to them that they will need to attend Outpatient Diabetes Education follow-up after all Rehab is completed.  (Not sure if she will require Home Health after discharge from Rehab, but cannot go for Diabetes Ed if receiving services in her home.)  Not sure if patient will  require insulin at discharge or not.  Suggested that patient try to give her own insulin some with her nurse's assistance.  Her brother with whom she lives can assist at home in necessary.  Thank you.  Lisa Pankonin S. Elsie Lincoln, RN, CNS, CDE Inpatient Diabetes Program, team pager [phone]     Addendum:  May want to consider extended release metformin at discharge as it may be better tolerated.  May also want to consider Marcelline Deist (excretes glucose in urine) unless there would be contraindications for her.  (Diabetes Coordinators have some coupons so patient could receive this drug at no cost if MD chooses to prescribe.)

## 2013-02-03 NOTE — Progress Notes (Signed)
FMTS Attending Daily Note: Cullen Vanallen MD [phone] pager office [phone] I have discussed this patient with the resident and reviewed the assessment and plan as documented above. I agree wit the resident's findings and plan.  

## 2013-02-03 NOTE — Progress Notes (Signed)
Patient ID: [name], female   DOB: [birth year], 37 y.o.   MRN: [id number] Patient arrived to floor via bed, escorted by nursing staff and family.  Patient is able to understand limited [place] but brother is fluent in [place] and is able to translate.  Patient verbalized understanding of rehab process, no questions asked.  Reports pain in right arm, right leg, and head.  Numbness noted in right side of face.  Appears to be in no immediate distress at this time.  Patient understands fall policy and how to call for nursing staff.  Signed safety agreement.  No further needs voiced at this time. Dani Gobble, RN

## 2013-02-03 NOTE — Progress Notes (Signed)
Family Medicine Teaching Service Daily Progress Note Intern Pager: [phone]  Patient name: [name] Medical record number: [id number] Date of birth: [birth year] Age: 37 y.o. Gender: female  Primary Care Provider: No PCP Per Patient Consultants: Neurology, PM&R Code Status: Full Code  Pt Overview and Major Events to Date:  [name] is a 37 y.o. female presenting with right sided numbness and weakness. PMH is significant for recently diagnosed HTN and DM (not currently treated.)   - 10/24 - Presented to ED; Code Stroke; Evaluated by Neurology and started on ASA - MRI/MRA revealed acute nonhemorrhagic linear infarcts involving the lateral left thalamus and potentially internal capsule - Carotid doppler - Findings consistent with 1 to 39 percent ICA stenosis. Vertebral artery flow antegrade bilaterally. -  Echo - Normal EF with no source of cardiac emboli noted - 10/25 - PT recommending CIR  Assessment and Plan: # Acute CVA with residual R-sided weakness - Neurology recommending ASA 81mg , risk factor modification - Continue statin, ACE - Continue ASA 81 mg - PT/OT recommending CIR.   - PM&R recommends 18-24 days of inpatient rehabilitation followed by home health therapy. Px "excellent" to make measurable gains. Overall functional Px "good."  - Awaiting approval by [employer] for inpatient rehabilitation - Investigating alternative explanations to CVA in young female  ANA, ESR, Anti-dsDNA pending  # HTN - Lisinopril 5 mg daily  # DM - Moderate SSI; Lantus 10 Units daily. CBGs under satisfactory control - Will need to determine insulin vs. oral regimen on discharge  # Transaminase elevation AST 40, ALT 65 - Abdominal U/S wnl - Hepatitis panel pending -  # Polycythemia RBC 6.03, Hgb 16.1 (Hgb 13.9 in March 2013) - Reticulocytes appropriate 2.4% - Fe studies normal; B12 1330, Folate normal - EPO pending  # Hypokalemia - Resolved  FEN/GI: Carb modified diet Prophylaxis:  SCD  Disposition: Pending insurance approval (can take 48-72 hrs) for CIR.  Subjective: Pt's brother is used as Nurse, learning disability. No overnight events.  Patient continues to have right sided weakness that is improving. She has been doing exercises for strengthening.   Objective: Temp:  [97.4 F (36.3 C)-98.2 F (36.8 C)] 98.2 F (36.8 C) (10/28 0941) Pulse Rate:  [87-104] 100 (10/28 0941) Resp:  [16-18] 18 (10/28 0515) BP: (108-127)/(64-80) 115/72 mmHg (10/28 0941) SpO2:  [98 %-100 %] 98 % (10/28 0941)  Physical Exam: General: Obese, well-appearing 37yo female in NAD. Cardiovascular: RRR. No murmurs, rubs, or gallops. Respiratory: CTAB. No rales, rhonchi, or wheezes Abdomen: Normal BS, soft, nontender, nondistended. Extremities: Warm, well perfused. No LE edema. Skin: Warm, dry, intact. Neuro: CN II-XII intact. RUE & RLE strength is 4/5  Laboratory:  Recent Labs Lab 01/30/13 0640 01/30/13 0644 01/31/13 0545 02/01/13 0935  WBC 7.2  --  9.9 8.2  HGB 15.6* 16.3* 15.8* 16.1*  HCT 43.5 48.0* 43.9 45.4  PLT 276  --  284 265    Recent Labs Lab 01/30/13 0640 01/30/13 0644 01/31/13 0545 02/01/13 0935 02/01/13 1215  NA 136 138 136 134*  --   K 3.4* 3.3* 3.0* 3.6  --   CL 101 103 101 101  --   CO2 21  --  21 20  --   BUN 12 11 16 11   --   CREATININE 0.46* 0.60 0.49* 0.63  --   CALCIUM 8.7  --  8.7 9.0  --   PROT 7.3  --   --   --  7.6  BILITOT 0.4  --   --   --  0.7  ALKPHOS 91  --   --   --  85  ALT 59*  --   --   --  65*  AST 31  --   --   --  40*  GLUCOSE 312* 314* 214* 295*  --    Lipid Panel     Component Value Date/Time   CHOL 240* 01/31/2013 0545   TRIG 129 01/31/2013 0545   HDL 56 01/31/2013 0545   CHOLHDL 4.3 01/31/2013 0545   VLDL 26 01/31/2013 0545   LDLCALC 158* 01/31/2013 0545    Lab Results  Component Value Date   HGBA1C 13.1* 01/30/2013   Imaging/Diagnostic Tests:  Ct Head Wo Contrast IMPRESSION: No acute intracranial abnormalities.    Mr  Brain Wo Contrast; MRA 01/30/2013  IMPRESSION: MRI HEAD IMPRESSION  1. Acute nonhemorrhagic linear infarcts involving the lateral left thalamus and potentially internal capsule. The maximal length is 12 mm. 2. Scattered subcortical T2 hyperintensities are slightly greater than expected for age. 3. Minimal sinus disease.  MRA HEAD IMPRESSION  1. Normal MRA circle of Willis without evidence for significant proximal stenosis, aneurysm, or branch vessel occlusion.    Echo: Study Conclusions Left ventricle: The cavity size was normal. Wall thickness was increased in a pattern of mild LVH. Systolic function was normal. The estimated ejection fraction was in the range of 60% to 65%. Wall motion was normal; there were no regional wall motion abnormalities. Impressions: - No cardiac source of emboli was indentified.  Carotid dopplers: Summary: - Findings consistent with 1 to 39 percent ICA stenosis. Vertebral artery flow antegrade bilaterally.  ULTRASOUND ABDOMEN COMPLETE  IMPRESSION:  Unremarkable abdominal ultrasound.   Hazeline Junker, MD 02/03/2013, 9:45 AM PGY-1, [facility] Family Medicine FPTS Intern pager: [phone], text pages welcome

## 2013-02-04 ENCOUNTER — Inpatient Hospital Stay (HOSPITAL_COMMUNITY): Payer: BC Managed Care – PPO | Admitting: Physical Therapy

## 2013-02-04 ENCOUNTER — Inpatient Hospital Stay (HOSPITAL_COMMUNITY): Payer: BC Managed Care – PPO | Admitting: Occupational Therapy

## 2013-02-04 DIAGNOSIS — G8110 Spastic hemiplegia affecting unspecified side: Secondary | ICD-10-CM

## 2013-02-04 DIAGNOSIS — I6330 Cerebral infarction due to thrombosis of unspecified cerebral artery: Secondary | ICD-10-CM

## 2013-02-04 DIAGNOSIS — Z8673 Personal history of transient ischemic attack (TIA), and cerebral infarction without residual deficits: Secondary | ICD-10-CM

## 2013-02-04 LAB — ANA: Anti Nuclear Antibody(ANA): NEGATIVE

## 2013-02-04 LAB — CBC WITH DIFFERENTIAL/PLATELET
Basophils Absolute: 0 10*3/uL (ref 0.0–0.1)
Basophils Relative: 0 % (ref 0–1)
Eosinophils Absolute: 0.1 10*3/uL (ref 0.0–0.7)
Eosinophils Relative: 2 % (ref 0–5)
HCT: 40.7 % (ref 36.0–46.0)
Hemoglobin: 13.8 g/dL (ref 12.0–15.0)
Lymphocytes Relative: 36 % (ref 12–46)
Lymphs Abs: 1.8 10*3/uL (ref 0.7–4.0)
MCH: 26.8 pg (ref 26.0–34.0)
MCHC: 33.9 g/dL (ref 30.0–36.0)
MCV: 79 fL (ref 78.0–100.0)
Monocytes Absolute: 0.7 10*3/uL (ref 0.1–1.0)
Monocytes Relative: 13 % — ABNORMAL HIGH (ref 3–12)
Neutro Abs: 2.5 10*3/uL (ref 1.7–7.7)
Neutrophils Relative %: 49 % (ref 43–77)
Platelets: 251 10*3/uL (ref 150–400)
RBC: 5.15 MIL/uL — ABNORMAL HIGH (ref 3.87–5.11)
RDW: 13.5 % (ref 11.5–15.5)
WBC: 5.1 10*3/uL (ref 4.0–10.5)

## 2013-02-04 LAB — GLUCOSE, CAPILLARY
Glucose-Capillary: 188 mg/dL — ABNORMAL HIGH (ref 70–99)
Glucose-Capillary: 239 mg/dL — ABNORMAL HIGH (ref 70–99)
Glucose-Capillary: 223 mg/dL — ABNORMAL HIGH (ref 70–99)
Glucose-Capillary: 206 mg/dL — ABNORMAL HIGH (ref 70–99)

## 2013-02-04 LAB — COMPREHENSIVE METABOLIC PANEL
ALT: 62 U/L — ABNORMAL HIGH (ref 0–35)
AST: 39 U/L — ABNORMAL HIGH (ref 0–37)
Albumin: 2.9 g/dL — ABNORMAL LOW (ref 3.5–5.2)
Alkaline Phosphatase: 68 U/L (ref 39–117)
BUN: 11 mg/dL (ref 6–23)
CO2: 23 mEq/L (ref 19–32)
Calcium: 9.1 mg/dL (ref 8.4–10.5)
Chloride: 105 mEq/L (ref 96–112)
Creatinine, Ser: 0.63 mg/dL (ref 0.50–1.10)
GFR calc Af Amer: >90 mL/min (ref >90)
GFR calc non Af Amer: >90 mL/min (ref >90)
Glucose, Bld: 191 mg/dL — ABNORMAL HIGH (ref 70–99)
Potassium: 4.3 mEq/L (ref 3.5–5.1)
Sodium: 137 mEq/L (ref 135–145)
Total Bilirubin: 0.8 mg/dL (ref 0.3–1.2)
Total Protein: 6.7 g/dL (ref 6.0–8.3)

## 2013-02-04 LAB — ANTI-DNA ANTIBODY, DOUBLE-STRANDED: ds DNA Ab: <1 IU/mL (ref <30)

## 2013-02-04 MED ORDER — GABAPENTIN 100 MG PO CAPS
100.0000 mg | ORAL_CAPSULE | Freq: Two times a day (BID) | ORAL | Status: DC
  Administered 2013-02-04 – 2013-02-07 (×8): 100 mg via ORAL
  Filled 2013-02-04 (×11): qty 1

## 2013-02-04 NOTE — Progress Notes (Signed)
Patient information reviewed and entered into eRehab system by Areyanna Figeroa, RN, CRRN, PPS Coordinator.  Information including medical coding and functional independence measure will be reviewed and updated through discharge.    

## 2013-02-04 NOTE — Evaluation (Signed)
Occupational Therapy Assessment and Plan  Patient Details  Name: [name] MRN: [id number] Date of Birth: June 19, 1975  OT Diagnosis: apraxia, ataxia, disturbance of vision, hemiplegia affecting dominant side and muscle weakness (generalized) Rehab Potential: Rehab Potential: Good ELOS: 2 weeks   Today's Date: 02/04/2013 Time: 1010 -1105 Time Calculation (min): 55 min  Problem List:  Patient Active Problem List   Diagnosis Date Noted  . CVA (cerebral infarction) 02/04/2013  . Acute ischemic stroke 01/31/2013  . Hypokalemia 01/31/2013  . Right sided weakness 01/30/2013  . HTN (hypertension) 01/30/2013  . DM (diabetes mellitus) 01/30/2013    Past Medical History:  Past Medical History  Diagnosis Date  . Hypertension   . Hypercholesteremia   . Diabetes mellitus without complication    Past Surgical History:  Past Surgical History  Procedure Laterality Date  . Cesarean section      Assessment & Plan Clinical Impression: Patient is a 37 y.o. right handed non-english speaking female with history of DM, HTN, who was admitted on 01/30/13 with right sided weakness and numbness. Last seen normal night---SBP 200 and CBG 307 at admission. MRI brain with acute nonhemorrhagic linear infarcts involving the lateral left thalamus and potentially internal capsule and MRA without significant stenosis. 2 D echo with EF 60-65% and no wall abnormalities. Carotid dopplers without significant ICA stenosis. Neurology recommends ASA 81 mg/daily for secondary stroke prevention as well as risk factor modification. Patient continues with right sided weakness with RLE instability poor weight shifting as well as decreased in coordination and blurred vision L>R.   Patient transferred to CIR on 02/03/2013 .    Patient currently requires mod with basic self-care skills secondary to muscle weakness, impaired timing and sequencing, motor apraxia, ataxia, decreased coordination and decreased motor planning,  decreased visual motor skills, decreased motor planning and decreased standing balance and decreased postural control.  Prior to hospitalization, patient could complete ADLs with independent .  Patient will benefit from skilled intervention to increase independence with basic self-care skills and increase level of independence with iADL prior to discharge home with care partner.  Anticipate patient will require intermittent supervision and follow up home health and follow up outpatient.  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 OT Patient demonstrates impairments in the following area(s): Balance;Motor;Perception;Sensory;Vision OT Basic ADL's Functional Problem(s): Grooming;Bathing;Dressing;Toileting OT Advanced ADL's Functional Problem(s): Simple Meal Preparation;Laundry;Light Housekeeping OT Transfers Functional Problem(s): Toilet;Tub/Shower OT Additional Impairment(s): Fuctional Use of Upper Extremity OT Plan OT Intensity: Minimum of 1-2 x/day, 45 to 90 minutes OT Frequency: 5 out of 7 days OT Duration/Estimated Length of Stay: 2 weeks OT Treatment/Interventions: Balance/vestibular training;Discharge planning;Community reintegration;Disease Conservator, museum/gallery;Functional mobility training;Neuromuscular re-education;Pain management;Patient/family education;Psychosocial support;Self Care/advanced ADL retraining;Therapeutic Activities;Therapeutic Exercise;UE/LE Strength taining/ROM;UE/LE Coordination activities;Visual/perceptual remediation/compensation OT Self Feeding Anticipated Outcome(s): independent OT Basic Self-Care Anticipated Outcome(s): mod I OT Toileting Anticipated Outcome(s): mod I OT Bathroom Transfers  Anticipated Outcome(s): mod I OT Recommendation Patient destination: Home Follow Up Recommendations: Home health OT;Outpatient OT Equipment Recommended: Tub/shower bench   Skilled Therapeutic Intervention OT eval initiated, ADL assessment completed at sit > stand level.  Pt required min-mod assist stand pivot transfers due to decreased muscle activation and fearfulness. Pt required increased time when attempting to brush teeth with dominant RUE, question motor planning difficulties.  Completed toilet and walk-in shower  transfer with use of grab bar with min-mod assist with physical assistance for proper positioning of RLE.  Pt with ?ataxia and/or dysmetria with RUE gross and fine motor coordination.  OT Evaluation Precautions/Restrictions  Precautions Precautions: Fall Restrictions Weight Bearing Restrictions: No Vital Signs Therapy Vitals Pulse Rate: 103 BP: 118/89 mmHg Patient Position, if appropriate: Sitting Oxygen Therapy SpO2: 99 % O2 Device: None (Room air) Pain Pain Assessment Pain Assessment: Faces Pain Score: 5  Faces Pain Scale: Hurts a little bit Pain Type: Acute pain Pain Location: Leg Pain Orientation: Left;Lateral Pain Descriptors / Indicators: Discomfort Pain Onset: Other (Comment) (began yesterday) Pain Intervention(s): Other (Comment) (premedicated) Home Living/Prior Functioning Home Living Available Help at Discharge: Family;Available PRN/intermittently (brother works 2nd shift, children in school, mother disabled) Type of Home: House Home Access: Stairs to enter Secretary/administrator of Steps: 3 Entrance Stairs-Rails: Right Home Layout: One level Additional Comments: Pt was caregiver to mother, performed cooking, cleaning and worked at [employer] 6 days/week  Lives With: Family (brother, her two children, mother) IADL History Homemaking Responsibilities: Yes Meal Prep Responsibility: Primary Laundry Responsibility: Primary Cleaning Responsibility:  Primary Bill Paying/Finance Responsibility: Secondary Shopping Responsibility: Primary Child Care Responsibility: Primary Current License: Yes Mode of Transportation: Car Occupation: Full time employment 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 Prior Function Level of Independence: Independent with homemaking with ambulation;Independent with basic ADLs;Independent with gait;Independent with transfers  Able to Take Stairs?: Yes Driving: Yes Vocation: Full time employment 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  Comments: was caregiver for her 19 yo old mother who had CVA 12 yrs ago ADL  See FIM Vision/Perception  Vision - History Baseline Vision: No visual deficits Patient Visual Report: Diplopia;Blurring of vision Vision - Assessment Eye Alignment: Within Functional Limits Vision Assessment: Vision tested Ocular Range of Motion: Within Functional Limits;Other (comment) (pt reports blurriness at end ranges) Tracking/Visual Pursuits: Decreased smoothness of horizontal tracking;Unable to hold eye position out of midline Perception Perception: Within Functional Limits Praxis Praxis: Impaired Praxis Impairment Details: Motor planning  Cognition Overall Cognitive Status: Within Functional Limits for tasks assessed Orientation Level: Oriented X4 Awareness: Appears intact Safety/Judgment: Appears intact Sensation Sensation Light Touch: Impaired Detail Light Touch Impaired Details: Impaired RUE;Impaired RLE Stereognosis: Not tested Hot/Cold: Not tested Proprioception: Impaired by gross assessment Additional Comments: pt reports feeling numbness and tingling sensation in RUE Coordination Gross Motor Movements are Fluid and Coordinated: No Fine Motor Movements are Fluid and Coordinated: No Coordination and Movement Description: decreased fluidity and impaired timing and sequencing of muscle activation Finger Nose Finger Test: dysmetria RUE, decreased  fluidity and impaired timing and sequencing of muscle activation Motor  Motor Motor: Hemiplegia;Motor impersistence Motor - Skilled Clinical Observations: R hemiplegia with impaired motor control, timing/sequencing and motor impersistence in WB positions Mobility  Bed Mobility Supine to Sit: 4: Min assist Supine to Sit Details (indicate cue type and reason): Pt required extra time and min A for supine to sit secondary to impaired motor planning and sequencing of transfers; pt able to advance bilat LE off of bed but initiated rolling to R and pushing off rails on R with LUE to bring trunk upright EOB  Trunk/Postural Assessment  Cervical Assessment Cervical Assessment: Within Functional Limits Thoracic Assessment Thoracic Assessment: Within Functional Limits Lumbar Assessment Lumbar Assessment: Within Functional Limits Postural Control Postural Control: Deficits on evaluation (Pt with increased trunk flexion in standing)  Balance Static Sitting Balance Static Sitting - Balance Support: Feet supported Static Sitting - Level  of Assistance: 5: Stand by assistance Dynamic Sitting Balance Dynamic Sitting - Balance Support: Feet supported;Left upper extremity supported Dynamic Sitting - Level of Assistance: 4: Min assist Static Standing Balance Static Standing - Balance Support: Left upper extremity supported;Right upper extremity supported Static Standing - Level of Assistance: 3: Mod assist Dynamic Standing Balance Dynamic Standing - Balance Support: Right upper extremity supported;Left upper extremity supported Dynamic Standing - Level of Assistance: 1: +2 Total assist Extremity/Trunk Assessment RUE Assessment RUE Assessment: Exceptions to [facility] RUE Strength RUE Overall Strength Comments: shoudler 4/5, elbow flexion/extension 3+/5, wrist 3+/5 Gross Grasp: Impaired (loose gross grasp) LUE Assessment LUE Assessment: Within Functional Limits  FIM:  FIM - Grooming Grooming Steps: Wash,  rinse, dry face;Wash, rinse, dry hands;Oral care, brush teeth, clean dentures Grooming: 4: Patient completes 3 of 4 or 4 of 5 steps FIM - Bathing Bathing Steps Patient Completed: Chest;Right Arm;Left Arm;Abdomen;Front perineal area;Right upper leg;Left upper leg Bathing: 3: Mod-Patient completes 5-7 66f 10 parts or 50-74% FIM - Upper Body Dressing/Undressing Upper body dressing/undressing steps patient completed: Thread/unthread right sleeve of pullover shirt/dresss;Thread/unthread left sleeve of pullover shirt/dress;Put head through opening of pull over shirt/dress;Pull shirt over trunk Upper body dressing/undressing: 4: Steadying assist FIM - Lower Body Dressing/Undressing Lower body dressing/undressing steps patient completed: Thread/unthread right underwear leg;Thread/unthread left underwear leg;Pull underwear up/down;Don/Doff right sock;Don/Doff left sock Lower body dressing/undressing: 4: Min-Patient completed 75 plus % of tasks FIM - Toileting Toileting steps completed by patient: Adjust clothing prior to toileting;Performs perineal hygiene;Adjust clothing after toileting Toileting Assistive Devices: Grab bar or rail for support Toileting: 4: Steadying assist FIM - Banker Devices: Arm rests Bed/Chair Transfer: 4: Supine > Sit: Min A (steadying Pt. > 75%/lift 1 leg);3: Bed > Chair or W/C: Mod A (lift or lower assist);3: Chair or W/C > Bed: Mod A (lift or lower assist) FIM - Diplomatic Services operational officer Devices: Grab bars Toilet Transfers: 4-To toilet/BSC: Min A (steadying Pt. > 75%);3-From toilet/BSC: Mod A (lift or lower assist) FIM - Secretary/administrator Devices: Shower chair;Grab bars;Walk in shower Tub/shower Transfers: 4-Into Tub/Shower: Min A (steadying Pt. > 75%/lift 1 leg);4-Out of Tub/Shower: Min A (steadying Pt. > 75%/lift 1 leg)   Refer to Care Plan for Long Term Goals  Recommendations for other  services: None  Discharge Criteria: Patient will be discharged from OT if patient refuses treatment 3 consecutive times without medical reason, if treatment goals not met, if there is a change in medical status, if patient makes no progress towards goals or if patient is discharged from hospital.  The above assessment, treatment plan, treatment alternatives and goals were discussed and mutually agreed upon: by patient  Rosalio Loud 02/04/2013, 12:14 PM

## 2013-02-04 NOTE — Progress Notes (Signed)
Occupational Therapy Session Note  Patient Details  Name: [name] MRN: [id number] Date of Birth: Jul 14, 1975  Today's Date: 02/04/2013 Time: 1417-1500 Time Calculation (min): 43 min  Short Term Goals: Week 1:  OT Short Term Goal 1 (Week 1): Pt will complete LB dressing with supervision. OT Short Term Goal 2 (Week 1): Pt will complete toilet transfer with supervision. OT Short Term Goal 3 (Week 1): Pt will complete tub/shower transfer with supervision. OT Short Term Goal 4 (Week 1): Pt will complete bathing with min assist. OT Short Term Goal 5 (Week 1): Pt will complete simple meal prep with min assist with use of RUE as nondominant hand.  Skilled Therapeutic Interventions/Progress Updates:  Pt seen for 1:1 OT with focus on RUE strengthening and FMC. Therapist took baseline measurements using dynamometer,pinch gauge, and 9- hole peg test. Dynamometer Avg Left hand  41lbs, Avg Right hand 14lbs. Pinch gauge tip to tip Avg Left hand 13lbs, Avg Right hand 5lbs. Three-Jaw Chuck Avg Left hand 13lbs, Avg right hand 6lbs. Pt 9-hole peg results Left hand 29 sec and Right hand 1 min 13 sec. During the 9-hole peg test therapist noticed ataxic movements when trying to pick up and place pegs in holes. Pt performed hand/finger strengthening exercises using medium resistive theraputty. Pt also performed [facility] tasks picking up checkers and placing them into rack. Pt demonstrated ataxic movements during this activity. Pt reported her arm was fatigued after session was over. Pt left in room sitting in w/c with all needs in reach.   Therapy Documentation Precautions:  Precautions Precautions: Fall Restrictions Weight Bearing Restrictions: No  Pain: Pain Assessment Pain Assessment: Faces Faces Pain Scale: Hurts a little bit Pain Type: Acute pain Pain Location: Leg Pain Orientation: Left;Lateral Pain Descriptors / Indicators: Discomfort Pain Onset: Other (Comment) (began yesterday) Pain Intervention(s):  Other (Comment) (premedicated)  See FIM for current functional status  Therapy/Group: Individual Therapy  Donna Christen 02/04/2013, 3:21 PM

## 2013-02-04 NOTE — Evaluation (Signed)
Physical Therapy Assessment and Plan  Patient Details  Name: [name] MRN: [id number] Date of Birth: Sep 13, 1975  PT Diagnosis: Abnormality of gait, Difficulty walking, Hemiplegia dominant, Impaired sensation, Muscle weakness and Pain in LLE and head/neck Rehab Potential: Excellent ELOS: 2 weeks   Today's Date: 02/04/2013 Time: 4098-1191 and [id number]  Time Calculation (min): 55 min and 21 min  Problem List:  Patient Active Problem List   Diagnosis Date Noted  . CVA (cerebral infarction) 02/04/2013  . Acute ischemic stroke 01/31/2013  . Hypokalemia 01/31/2013  . Right sided weakness 01/30/2013  . HTN (hypertension) 01/30/2013  . DM (diabetes mellitus) 01/30/2013    Past Medical History:  Past Medical History  Diagnosis Date  . Hypertension   . Hypercholesteremia   . Diabetes mellitus without complication    Past Surgical History:  Past Surgical History  Procedure Laterality Date  . Cesarean section      Assessment & Plan Clinical Impression: Patient is a 37 y.o. non-english speaking female with history of DM, HTN, who was admitted on 01/30/13 with right sided weakness and numbness. Last seen normal night---SBP 200 and CBG 307 at admission. MRI brain with acute nonhemorrhagic linear infarcts involving the lateral left thalamus and potentially internal capsule and MRA without significant stenosis. 2 D echo with EF 60-65% and no wall abnormalities. Carotid dopplers without significant ICA stenosis. Neurology recommends ASA 81 mg/daily for secondary stroke prevention as well as risk factor modification. Patient continues with right sided weakness with RLE instability poor weight shifting as well as decreased in coordination and blurred vision L>R.  Patient transferred to CIR on 02/03/2013 .   Patient currently requires mod with transfers but +2 A for gait secondary to muscle weakness, impaired timing and sequencing, decreased coordination and decreased motor planning, blurred  vision and diplopia and decreased standing balance, decreased postural control, hemiplegia and decreased balance strategies.  Prior to hospitalization, patient was independent  with mobility and lived with Family (brother, her two children, mother) in a House home.  Home access is 3Stairs to enter.  Patient will benefit from skilled PT intervention to maximize safe functional mobility, minimize fall risk and decrease caregiver burden for planned discharge home with intermittent assist of brother who works second shift; mother is disabled.  Anticipate patient will benefit from follow up HH at discharge.  PT - End of Session Activity Tolerance: Endurance does not limit participation in activity Endurance Deficit: No PT Assessment Rehab Potential: Excellent Barriers to Discharge: Decreased caregiver support (no family to assist during the day/night) PT Patient demonstrates impairments in the following area(s): Balance;Motor;Pain;Sensory PT Transfers Functional Problem(s): Bed Mobility;Bed to Chair;Car;Furniture;Floor PT Locomotion Functional Problem(s): Ambulation;Stairs PT Plan PT Intensity: Minimum of 1-2 x/day ,45 to 90 minutes PT Frequency: 5 out of 7 days PT Duration Estimated Length of Stay: 2 weeks PT Treatment/Interventions: Ambulation/gait training;Balance/vestibular training;Community reintegration;Discharge planning;DME/adaptive equipment instruction;Functional electrical stimulation;Functional mobility training;Neuromuscular re-education;Pain management;Patient/family education;Splinting/orthotics;Stair training;Therapeutic Activities;Therapeutic Exercise;UE/LE Strength taining/ROM;UE/LE Coordination activities;Visual/perceptual remediation/compensation PT Transfers Anticipated Outcome(s): Mod I  PT Locomotion Anticipated Outcome(s): Mod I household gait, supervision in controlled and community environments PT Recommendation Follow Up Recommendations: Home health PT Patient  destination: Home Equipment Recommended: Cane;Rolling walker with 5" wheels Equipment Details: cane vs. RW TBD as pt progresses  Skilled Therapeutic Intervention First PT session: focus on gait training; see gait below  PM PT session: pt resting in bed; no reports or appearance of pain.  Performed PASS to assess postural control and balance; score 24/36.  Performed NMR  training with focus on R lateral and anterior weight shifting, motor control, coordination and sustained activation of RLE during 8 sit <> stand from bed with bilat UE positioned over R knee for tactile cues for weight shift and activation with mod A overall for trunk control, full weight shift and eccentric control during stand > sit.  Also performed scooting along the bed to L for increased extension activation and R lateral weight shift.  Sit > supine with min A.   PT Evaluation Precautions/Restrictions Precautions Precautions: Fall Restrictions Weight Bearing Restrictions: No General Chart Reviewed: Yes Response to Previous Treatment: Not applicable Family/Caregiver Present: No  Vital SignsTherapy Vitals Pulse Rate: 103 BP: 118/89 mmHg Patient Position, if appropriate: Sitting Oxygen Therapy SpO2: 99 % O2 Device: None (Room air) Pain Pain Assessment Pain Assessment: Faces Pain Score: 5  Faces Pain Scale: Hurts a little bit Pain Type: Acute pain Pain Location: Leg Pain Orientation: Left;Lateral Pain Descriptors / Indicators: Discomfort Pain Onset: Other (Comment) (began yesterday) Pain Intervention(s): Other (Comment) (premedicated) Home Living/Prior Functioning Home Living Available Help at Discharge: Family;Available PRN/intermittently (brother works 2nd shift, children in school, mother disabled) Type of Home: House Home Access: Stairs to enter Secretary/administrator of Steps: 3 Entrance Stairs-Rails: Right Home Layout: One level Additional Comments: Pt was caregiver to mother, performed cooking,  cleaning and worked at [employer] 6 days/week  Lives With: Family (brother, her two children, mother) Prior Function Level of Independence: Independent with homemaking with ambulation;Independent with basic ADLs;Independent with gait;Independent with transfers  Able to Take Stairs?: Yes Driving: Yes Vocation: Full time employment 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  Comments: was caregiver for her 51 yo old mother who had CVA 12 yrs ago Vision/Perception  Vision - History Baseline Vision: No visual deficits Patient Visual Report: Diplopia;Blurring of vision Vision - Assessment Eye Alignment: Within Functional Limits Vision Assessment: Vision tested Ocular Range of Motion: Within Functional Limits;Other (comment) (pt reports blurriness at end ranges) Tracking/Visual Pursuits: Decreased smoothness of horizontal tracking;Unable to hold eye position out of midline Perception Perception: Within Functional Limits Praxis Praxis: Impaired Praxis Impairment Details: Motor planning  Cognition Overall Cognitive Status: Within Functional Limits for tasks assessed Orientation Level: Oriented X4 Awareness: Appears intact Safety/Judgment: Appears intact Sensation Sensation Light Touch: Impaired Detail Light Touch Impaired Details: Impaired RUE;Impaired RLE Stereognosis: Not tested Hot/Cold: Not tested Proprioception: Impaired by gross assessment Additional Comments: pt reports feeling numbness and tingling sensation in RUE Coordination Gross Motor Movements are Fluid and Coordinated: No Fine Motor Movements are Fluid and Coordinated: No Coordination and Movement Description: decreased fluidity and impaired timing and sequencing of muscle activation Finger Nose Finger Test: dysmetria RUE, decreased fluidity and impaired timing and sequencing of muscle activation Motor  Motor Motor: Hemiplegia;Motor impersistence Motor - Skilled Clinical Observations: R hemiplegia with  impaired motor control, timing/sequencing and motor impersistence in WB positions  Mobility Bed Mobility Supine to Sit: 4: Min assist Supine to Sit Details (indicate cue type and reason): Pt required extra time and min A for supine to sit secondary to impaired motor planning and sequencing of transfers; pt able to advance bilat LE off of bed but initiated rolling to R and pushing off rails on R with LUE to bring trunk upright EOB Transfers Stand Pivot Transfers: 3: Mod assist Stand Pivot Transfer Details (indicate cue type and reason): Pt performed stand pivot transfers bed > w/c <> toilet with L HHA and mod A for lifting from bed or toilet and for  stabilization of RLE during pivot; pt initially attempting to reach with LUE to w/c and perform almost 360 turn to reach w/c.  Also during toilet transfer and standing for toileting pt would reach across her body to use grab bar with LUE to stabilize.   Locomotion  Ambulation Ambulation: Yes Ambulation/Gait Assistance: 1: +2 Total assist Ambulation Distance (Feet): 60 Feet Assistive device: 2 person hand held assist Ambulation/Gait Assistance Details: Pt required +2 HHA for gait on level surface secondary to RLE and trunk instability in standing with verbal and tactile cues to maintain upright trunk, facilitate R lateral weight shift and stabilization of RLE in stance to allow for full LLE step length.  Pt initially slamming L foot on floor during step but with verbal and visual cues pt able to begin to take full step length LLE with assistance for balance and full anterior weight shift over RLE. Gait Gait Pattern: Step-to pattern;Decreased step length - left;Decreased stance time - right;Decreased stride length;Decreased dorsiflexion - right;Decreased weight shift to right;Right genu recurvatum;Trunk flexed;Narrow base of support Stairs / Additional Locomotion Stairs: Yes Stairs Assistance: 2: Max Editor, commissioning Details (indicate cue type  and reason): Pt performed stair negotiation with L rail and max A to maintain upright trunk posture and for stabilization of RLE in stance while advancing LLE and assistance to fully advance R foot to next step. Verbal and visual cues given for safe step to sequence Stair Management Technique: One rail Left;Step to pattern;Forwards Number of Stairs: 5 Height of Stairs: 6 Wheelchair Mobility Wheelchair Mobility: No Distance: 150  Trunk/Postural Assessment  Cervical Assessment Cervical Assessment: Within Functional Limits Thoracic Assessment Thoracic Assessment: Within Functional Limits Lumbar Assessment Lumbar Assessment: Within Functional Limits Postural Control Postural Control: Deficits on evaluation (Pt with increased trunk flexion in standing)  Balance Static Sitting Balance Static Sitting - Balance Support: Feet supported Static Sitting - Level of Assistance: 5: Stand by assistance Dynamic Sitting Balance Dynamic Sitting - Balance Support: Feet supported;Left upper extremity supported Dynamic Sitting - Level of Assistance: 4: Min assist Static Standing Balance Static Standing - Balance Support: Left upper extremity supported;Right upper extremity supported Static Standing - Level of Assistance: 3: Mod assist Dynamic Standing Balance Dynamic Standing - Balance Support: Right upper extremity supported;Left upper extremity supported Dynamic Standing - Level of Assistance: 1: +2 Total assist Extremity Assessment  RUE Assessment RUE Assessment: Exceptions to [facility] RUE Strength RUE Overall Strength Comments: shoudler 4/5, elbow flexion/extension 3+/5, wrist 3+/5 Gross Grasp: Impaired (loose gross grasp) LUE Assessment LUE Assessment: Within Functional Limits RLE Assessment RLE Assessment: Exceptions to [facility] RLE Strength RLE Overall Strength: Deficits RLE Overall Strength Comments: 3/5 hip flexion, 4-/5 knee flexion, extension and ankle DF LLE Assessment LLE Assessment: Within  Functional Limits  FIM:  FIM - Banker Devices: Arm rests Bed/Chair Transfer: 4: Supine > Sit: Min A (steadying Pt. > 75%/lift 1 leg);3: Bed > Chair or W/C: Mod A (lift or lower assist);3: Chair or W/C > Bed: Mod A (lift or lower assist) FIM - Locomotion: Wheelchair Distance: 150 Locomotion: Wheelchair: 1: Total Assistance/staff pushes wheelchair (Pt<25%) FIM - Locomotion: Ambulation Locomotion: Ambulation Assistive Devices: Other (comment) (HHA) Ambulation/Gait Assistance: 1: +2 Total assist Locomotion: Ambulation: 1: Two helpers FIM - Locomotion: Stairs Locomotion: Building control surveyor: Radio broadcast assistant - 1 Locomotion: Stairs: 2: Up and Down 4 - 11 stairs with maximal assistance (Pt: 25 - 49%)   Refer to Care Plan for Long Term Goals  Recommendations for  other services: None  Discharge Criteria: Patient will be discharged from PT if patient refuses treatment 3 consecutive times without medical reason, if treatment goals not met, if there is a change in medical status, if patient makes no progress towards goals or if patient is discharged from hospital.  The above assessment, treatment plan, treatment alternatives and goals were discussed and mutually agreed upon: by patient  [name] 02/04/2013, 12:21 PM

## 2013-02-04 NOTE — Progress Notes (Addendum)
Subjective/Complaints: 37 y.o. non-english speaking female with history of DM, HTN, who was admitted on 01/30/13 with right sided weakness and numbness. Last seen normal night---SBP 200 and CBG 307 at admission. MRI brain with acute nonhemorrhagic linear infarcts involving the lateral left thalamus and potentially internal capsule and MRA without significant stenosis. 2 D echo with EF 60-65% and no wall abnormalities. Carotid dopplers without significant ICA stenosis. Neurology recommends ASA 81 mg/daily for secondary stroke prevention as well as risk factor modification. Patient continues with right sided weakness with RLE instability poor weight shifting as well as decreased in coordination and blurred vision L>R.  Complain of bilateral leg pain overnight. States that occurs without movement. No chest pains or shortness of breath  Review of Systems - Negative except leg pain, language barrier, translator not here yet  Objective: Vital Signs: Blood pressure 116/79, pulse 91, temperature 98.3 F (36.8 C), temperature source Oral, resp. rate 18, height 4\' 11"  (1.499 m), weight 71.6 kg (157 lb 13.6 oz), last menstrual period 02/03/2013, SpO2 100.00%. No results found. Results for orders placed during the hospital encounter of 02/03/13 (from the past 72 hour(s))  GLUCOSE, CAPILLARY     Status: Abnormal   Collection Time    02/03/13  9:25 PM      Result Value Range   Glucose-Capillary 236 (*) 70 - 99 mg/dL   Comment 1 Notify RN    CBC WITH DIFFERENTIAL     Status: Abnormal   Collection Time    02/04/13  5:48 AM      Result Value Range   WBC 5.1  4.0 - 10.5 K/uL   RBC 5.15 (*) 3.87 - 5.11 MIL/uL   Hemoglobin 13.8  12.0 - 15.0 g/dL   HCT 28.4  13.2 - 44.0 %   MCV 79.0  78.0 - 100.0 fL   MCH 26.8  26.0 - 34.0 pg   MCHC 33.9  30.0 - 36.0 g/dL   RDW 10.2  72.5 - 36.6 %   Platelets 251  150 - 400 K/uL   Neutrophils Relative % 49  43 - 77 %   Neutro Abs 2.5  1.7 - 7.7 K/uL   Lymphocytes  Relative 36  12 - 46 %   Lymphs Abs 1.8  0.7 - 4.0 K/uL   Monocytes Relative 13 (*) 3 - 12 %   Monocytes Absolute 0.7  0.1 - 1.0 K/uL   Eosinophils Relative 2  0 - 5 %   Eosinophils Absolute 0.1  0.0 - 0.7 K/uL   Basophils Relative 0  0 - 1 %   Basophils Absolute 0.0  0.0 - 0.1 K/uL  COMPREHENSIVE METABOLIC PANEL     Status: Abnormal   Collection Time    02/04/13  5:48 AM      Result Value Range   Sodium 137  135 - 145 mEq/L   Potassium 4.3  3.5 - 5.1 mEq/L   Chloride 105  96 - 112 mEq/L   CO2 23  19 - 32 mEq/L   Glucose, Bld 191 (*) 70 - 99 mg/dL   BUN 11  6 - 23 mg/dL   Creatinine, Ser 4.40  0.50 - 1.10 mg/dL   Calcium 9.1  8.4 - 34.7 mg/dL   Total Protein 6.7  6.0 - 8.3 g/dL   Albumin 2.9 (*) 3.5 - 5.2 g/dL   AST 39 (*) 0 - 37 U/L   ALT 62 (*) 0 - 35 U/L   Alkaline Phosphatase 68  39 -  117 U/L   Total Bilirubin 0.8  0.3 - 1.2 mg/dL   GFR calc non Af Amer >90  >90 mL/min   GFR calc Af Amer >90  >90 mL/min   Comment: (NOTE)     The eGFR has been calculated using the CKD EPI equation.     This calculation has not been validated in all clinical situations.     eGFR's persistently <90 mL/min signify possible Chronic Kidney     Disease.  GLUCOSE, CAPILLARY     Status: Abnormal   Collection Time    02/04/13  7:29 AM      Result Value Range   Glucose-Capillary 188 (*) 70 - 99 mg/dL      Physical Exam  Constitutional: She is oriented to person, place, and time. She appears well-developed and well-nourished.  HENT:  Head: Normocephalic and atraumatic.  Eyes: Conjunctivae and EOM are normal. Pupils are equal, round, and reactive to light.  Neck: No tracheal deviation present. No thyromegaly present.  Cardiovascular: Normal rate. Regular rhythm. Right leg with mild ant tibial tenderness, no edema. Homans sign-  Respiratory: Effort normal.  Neurological: She is alert and oriented to person, place, and time. Has good insight and awareness. Answered all basic questions relatively  appropriate despite language issues. Good sitting balance. Speech generally clear.  RUE 3+ to 4 deltoid, bicep, tricep, wrist, HI, RLE 3+HF, 4-KE, 4 ADF/APF. Sensory 1 to 1+/2 right face, arm, leg.  Psychiatric:  Flat affect but generally appropriate.    Assessment/Plan: 1. Functional deficits secondary to right hemiparesis secondary to left thalamic and left internal capsule lacunar infarcts which require 3+ hours per day of interdisciplinary therapy in a comprehensive inpatient rehab setting. Physiatrist is providing close team supervision and 24 hour management of active medical problems listed below. Physiatrist and rehab team continue to assess barriers to discharge/monitor patient progress toward functional and medical goals. FIM:                                  Medical Problem List and Plan:  1. Left thalamic internal capsule infarct likely due to small vessel disease  2. DVT Prophylaxis/Anticoagulation: SCDs. Monitor for any signs of DVT  , has mild leg pain but no other signs of DVT  3. Pain Management: Tylenol as needed and add gabapentin for neuropathy pain, may also have thalamic pain 4. Neuropsych: This patient is capable of making decisions on her own behalf.  5. Hypertension. Lisinopril 5 mg daily. Monitor with increased activity  6. Diabetes mellitus with peripheral neuropathy. Hemoglobin A1c 13.1.lantus insulin 10 units daily. Check blood sugars a.c. and at bedtime   LOS (Days) 1 A FACE TO FACE EVALUATION WAS PERFORMED  KIRSTEINS,ANDREW E 02/04/2013, 9:31 AM

## 2013-02-04 NOTE — Progress Notes (Signed)
Inpatient Diabetes Program Recommendations  AACE/ADA: New Consensus Statement on Inpatient Glycemic Control (2013)  Target Ranges:  Prepandial:   less than 140 mg/dL      Peak postprandial:   less than 180 mg/dL (1-2 hours)      Critically ill patients:  140 - 180 mg/dL   Results for [place], [facility] (MRN [id number]) as of 02/04/2013 14:47  Ref. Range 02/03/2013 06:35 02/03/2013 11:29 02/03/2013 16:09 02/03/2013 21:25 02/04/2013 07:29 02/04/2013 11:27  Glucose-Capillary Latest Range: 70-99 mg/dL 409 (H) 811 (H) 914 (H) 236 (H) 188 (H) 239 (H)    Inpatient Diabetes Program Recommendations Insulin - Basal: Please consider increasing Lantus to 12 units daily. Correction (SSI): Please consider increasing Novolog correction to resistant scale.  Note: Blood glucose ranged from 183-236 mg/dl yesterday and fasting blood glucose was 188 mg/dl this morning.  Please consider increasing Lantus to 12 units daily and increasing Novolog correction to resistant scale.  Current plan is for patient to go to inpatient rehab; patient would benefit from outpatient diabetes education after discharged from inpatient rehab.  Will continue to follow while inpatient.  Thanks, Orlando Penner, RN, MSN, CCRN Diabetes Coordinator Inpatient Diabetes Program [phone] (Team Pager) [phone] ([facility] office) [phone] [facility] office)

## 2013-02-04 NOTE — Discharge Summary (Signed)
Family Medicine Teaching Service  Discharge Note : Attending Moria Brophy MD Pager [phone] Office [phone] I have seen and examined this patient, reviewed their chart and discussed discharge planning wit the resident at the time of discharge. I agree with the discharge plan as above.  

## 2013-02-05 ENCOUNTER — Inpatient Hospital Stay (HOSPITAL_COMMUNITY): Payer: BC Managed Care – PPO

## 2013-02-05 ENCOUNTER — Inpatient Hospital Stay (HOSPITAL_COMMUNITY): Payer: BC Managed Care – PPO | Admitting: *Deleted

## 2013-02-05 ENCOUNTER — Inpatient Hospital Stay (HOSPITAL_COMMUNITY): Payer: BC Managed Care – PPO | Admitting: Occupational Therapy

## 2013-02-05 DIAGNOSIS — I69998 Other sequelae following unspecified cerebrovascular disease: Secondary | ICD-10-CM

## 2013-02-05 DIAGNOSIS — R209 Unspecified disturbances of skin sensation: Secondary | ICD-10-CM

## 2013-02-05 LAB — GLUCOSE, CAPILLARY
Glucose-Capillary: 174 mg/dL — ABNORMAL HIGH (ref 70–99)
Glucose-Capillary: 283 mg/dL — ABNORMAL HIGH (ref 70–99)
Glucose-Capillary: 218 mg/dL — ABNORMAL HIGH (ref 70–99)
Glucose-Capillary: 176 mg/dL — ABNORMAL HIGH (ref 70–99)

## 2013-02-05 MED ORDER — INSULIN GLARGINE 100 UNIT/ML ~~LOC~~ SOLN
15.0000 [IU] | Freq: Every day | SUBCUTANEOUS | Status: DC
  Administered 2013-02-06 – 2013-02-16 (×11): 15 [IU] via SUBCUTANEOUS
  Filled 2013-02-05 (×12): qty 0.15

## 2013-02-05 NOTE — Progress Notes (Signed)
Physical Therapy Session Note  Patient Details  Name: [name] MRN: [id number] Date of Birth: [birth year]  Today's Date: 02/05/2013 Time: 4098-1191 Time Calculation (min): 45 min  Short Term Goals: Week 1:  PT Short Term Goal 1 (Week 1): Pt will peform bed mobility on flat bed, no rail with supervision PT Short Term Goal 2 (Week 1): Pt will perform bed <> w/c transfers with min A to L and R side PT Short Term Goal 3 (Week 1): Pt will perform gait in controlled environment x 100' min A PT Short Term Goal 4 (Week 1): Pt will negotiate 3 stairs with one rail and min A  Skilled Therapeutic Interventions/Progress Updates:   Pt ambulated 50 feet with L rail and HHA on right. Pt was able to maintain heel strike on R with verbal cues. Pt required rest break before attempting ambulation with RW. Pt then ambulated 50 feet with RW with R walker splint and min assist for RLE positioning and maintaining heel strike. Therapist applied kinesiotape to R upper trapezius secondary to pt complaints of R shoulder pain with movement and UE weightbearing. Pt performed seated heel and toe tapping onto visual markers with RLE to improve motor sequencing and coordination. Patient required min assist for stand pivot chair>bed and min assist sit>supine for RLE placement onto bed. Left pt supine in bed with needs in reach.   Therapy Documentation Precautions:  Precautions Precautions: Fall Restrictions Weight Bearing Restrictions: No    Vital Signs: Therapy Vitals Temp: 97.8 F (36.6 C) Temp src: Oral Pulse Rate: 95 Resp: 22 BP: 111/68 mmHg Patient Position, if appropriate: Lying Oxygen Therapy SpO2: 100 % O2 Device: None (Room air) Pain: Pain Assessment Pain Type: Acute pain Pain Location: Shoulder Pain Orientation: Posterior Applied kinesiotape to R upper trapezius to decrease pain and facilitate muscle relaxation.  Mobility: Bed Mobility Bed Mobility: Sit to Supine Sit to Supine: 4: Min  assist Sit to Supine - Details: Manual facilitation for placement Transfers Sit to Stand: 4: Min guard, min assist for trunk support at end of session due to pt fatigue. Stand to Sit: 4: Min assist Stand to Sit Details (indicate cue type and reason): Verbal cues for sequencing;Manual facilitation for placement Stand Pivot Transfers: 4: Min assist Stand Pivot Transfer Details: Manual facilitation for weight shifting Locomotion : Ambulation Ambulation: Yes Ambulation/Gait Assistance: 4: Min assist Ambulation Distance (Feet): 50 Feet Assistive device: Rolling walker Ambulation/Gait Assistance Details: Verbal cues for technique;Verbal cues for gait pattern;Verbal cues for safe use of DME/AE Ambulation/Gait Assistance Details: Pt initially ambulating with forefoot contact on R and slamming L foot down on floor while stepping but with verbal cues pt maintained consistent heel strike. Gait Gait: Yes Gait Pattern: Decreased step length - left;Decreased stance time - right;Decreased weight shift to right;Decreased dorsiflexion - right Stairs / Additional Locomotion Stairs: Yes Stairs Assistance: 2: Max Estate agent Assistance Details: Verbal cues for gait pattern;Manual facilitation for placement;Verbal cues for technique;Manual facilitation for weight shifting Stair Management Technique: One rail Left Number of Stairs: 5 Wheelchair Mobility Wheelchair Mobility: No   See FIM for current functional status  Therapy/Group: Individual Therapy  [name] 02/05/2013, 3:55 PM

## 2013-02-05 NOTE — Progress Notes (Signed)
[facility] Individual Statement of Services  Patient Name:  [name]  Date:  02/05/2013  Welcome to the [facility].  Our goal is to provide you with an individualized program based on your diagnosis and situation, designed to meet your specific needs.  With this comprehensive rehabilitation program, you will be expected to participate in at least 3 hours of rehabilitation therapies Monday-Friday, with modified therapy programming on the weekends.  Your rehabilitation program will include the following services:  Physical Therapy (PT), Occupational Therapy (OT), 24 hour per day rehabilitation nursing, Neuropsychology, Case Management (Social Worker), Rehabilitation Medicine, Nutrition Services and Pharmacy Services  Weekly team conferences will be held on Wednesdays to discuss your progress.  Your Social Worker will talk with you frequently to get your input and to update you on team discussions.  Team conferences with you and your family in attendance may also be held.  Expected length of stay:  2 weeks  Overall anticipated outcome:   Modified Independent  Depending on your progress and recovery, your program may change. Your Social Worker will coordinate services and will keep you informed of any changes. Your Social Worker's name and contact numbers are listed  below.  The following services may also be recommended but are not provided by the [facility]:   Driving Evaluations  Home Health Rehabiltiation Services  Outpatient Rehabilitation Services  Vocational Rehabilitation   Arrangements will be made to provide these services after discharge if needed.  Arrangements include referral to agencies that provide these services.  Your insurance has been verified to be:   BCBS Your primary doctor is:    Does not currently have one.  Family is assisting patient to find one.  Pertinent information will be shared with your doctor  and your insurance company.  Social Worker:  Staci Acosta, LCSW  [phone] or [phone]  Information discussed with and copy given to patient by: Elvera Lennox, 02/05/2013, 1:43 PM

## 2013-02-05 NOTE — Progress Notes (Signed)
I agree with treatment noted as documented.  Edson Snowball, PT

## 2013-02-05 NOTE — Progress Notes (Signed)
Social Work Assessment and Plan  Patient Details  Name: [name] MRN: [id number] Date of Birth: 03-20-1976  Today's Date: 02/05/2013  Problem List:  Patient Active Problem List   Diagnosis Date Noted  . CVA (cerebral infarction) 02/04/2013  . Acute ischemic stroke 01/31/2013  . Hypokalemia 01/31/2013  . Right sided weakness 01/30/2013  . HTN (hypertension) 01/30/2013  . DM (diabetes mellitus) 01/30/2013   Past Medical History:  Past Medical History  Diagnosis Date  . Hypertension   . Hypercholesteremia   . Diabetes mellitus without complication    Past Surgical History:  Past Surgical History  Procedure Laterality Date  . Cesarean section     Social History:  reports that she has never smoked. She does not have any smokeless tobacco history on file. She reports that she does not drink alcohol or use illicit drugs.  Family / Support Systems Marital Status: Divorced Patient Roles: Advertising account executive;Other (Comment) (sister and daugther) Children: 51 y/o dtr and 25 y/o son Other Supports: 2 brothers  Freida Busman (helping with children while pt is hospitalized) and [name] (pt lives with him, but he works 2:30pm - 12 midnight) Anticipated Caregiver: family prn Ability/Limitations of Caregiver: Freida Busman works. [name] brother works 1430 until late. Mother had a stroke 12 years ago and has limited use of right hand, but can assist pt with meals and call 911 for emergency and provide supervision) Caregiver Availability: 24/7 (mother is in the home 24/7 and can provide supervision only, no physical assistance.) Family Dynamics: close family.  Pt's ex-husband does not help with child support and pt does not know where he is and he is remarried.  Social History Preferred language: [place] Religion: Christian Cultural Background: [place] came to [place] from [place] 15 years ago. 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 Legal Hisotry/Current Legal Issues: None Guardian/Conservator: N/A   Abuse/Neglect Physical Abuse: Denies Verbal Abuse: Denies Sexual Abuse: Denies Exploitation of patient/patient's resources: Denies Self-Neglect: Denies  Emotional Status Pt's affect, behavior and adjustment status: Pt is appropriately adjusting to her physical condition and is encouraged that she already has good use of her right arm. Recent Psychosocial Issues: Pt has two children at home.  They are 10 and 12 and can do a lot for themselves.  Pt's brother, Freida Busman, is helping with the children while she is in rehab. Pyschiatric History: None Substance Abuse History: None  Patient / Family Perceptions, Expectations & Goals Pt/Family understanding of illness & functional limitations: Pt expressed understanding of her condition.  CSW offered for PA to come answer any questions while interpreter was present, but she had none.  She is appreciative of rehab staff. Premorbid pt/family roles/activities: Pt was working, caring for her children, and helping to take care of her brother's home where she lives. Anticipated changes in roles/activities/participation: Pt said her mother can do some light housekeeping and her brother takes care of the exterior of the home. Pt/family expectations/goals: Pt would like to "get my strength back all over my body."  [employer]: None Premorbid Home Care/DME Agencies: None Transportation available at discharge: Freida Busman - brother Resource referrals  recommended: Neuropsychology;Support group (specify) ([facility])  Discharge Planning Living Arrangements: Children;Parent;Other relatives  (brother) Support Systems: Children;Other relatives;Friends/neighbors;Parent (workplace) Type of Residence: Private residence Insurance Resources: Media planner (specify) Herbalist) Financial Resources: Employment Financial Screen Referred: No Living Expenses: Database administrator Management: Patient Does the patient have any problems obtaining your medications?: No Home Management: pt, pt's mother, and pt's brother Patient/Family Preliminary Plans: Pt plans to go to her home when she leaves the hospital.  Her mother is home all the time and can provde supervision, but no physical assistance. Barriers to Discharge: Family Support (brothers both work), steps Social Work Anticipated Follow Up Needs: HH/OP;Support Group Expected length of stay: 2 weeks  Clinical Impression CSW met with pt to complete assessment with the assistance of the interpreter.  Pt is a mother to a 42 y/o dtr and 107 y/o son and she works at a Industrial/product designer.  Pt has good family support, but not really anyone to provide physical assistance.  Pt lives with brother [name]), her two children, and her mother.  Freida Busman, brother, is helping with children while pt is in the hospital.  Pt's mother had a stroke 12 years ago and does not have full use of her right arm, but pt reports she can assist pt at home with meals and supervision, just no physical, hands-on assistance.  Pt cannot return to work until she can use her right hand, as she is a Agricultural engineer.  Pt has already spoken with employer and they know what is going on and they will have a job for her when she can return.  Pt plans to go to her home at d/c.  CSW made her aware of her targeted d/c date and pt was glad she would be here for a while so she has a chance to keep working with the therapists so that she can regain her strength.  CSW explained role and offered support.  Pt is doing well emotionally.  Also spoke briefly with [name], brother, who works 2:30 pm - midnight.  He  will not be able to provide any assistance to pt due to his work schedule.  Freida Busman, brother, works as well.  CSW will continue to follow pt and assist as needed.  Annesha Delgreco, [place] 02/05/2013, 1:49 PM

## 2013-02-05 NOTE — Progress Notes (Signed)
I have reviewed and agree with the attached treatment note.  [place], [facility]

## 2013-02-05 NOTE — Progress Notes (Signed)
Physical Therapy Session Note  Patient Details  Name: [name] MRN: [id number] Date of Birth: [birth year]  Today's Date: 02/05/2013 Time: 1030-1130 Time Calculation (min): 60 min  Short Term Goals: Week 1:  PT Short Term Goal 1 (Week 1): Pt will peform bed mobility on flat bed, no rail with supervision PT Short Term Goal 2 (Week 1): Pt will perform bed <> w/c transfers with min A to L and R side PT Short Term Goal 3 (Week 1): Pt will perform gait in controlled environment x 100' min A PT Short Term Goal 4 (Week 1): Pt will negotiate 3 stairs with one rail and min A  Skilled Therapeutic Interventions/Progress Updates:    Patient received seated EOB, social work and interpreter present. Session focused on functional transfers, wheelchair mobility, gait training, stair negotiation, and R UE/LE NMR. See below for details. Patient stand pivot transfer bed>wheelchair (R side) minA, verbal cues for hand placement. Patient totalA wheelchair mobility room>gym. Patient seated edge of mat, instructed in lateral scooting long mat R<>L x3 trials supervision level with use of B LE and R UE only for R UE NMR. Verbal/visual cues for technique and hand placement. Patient stand pivot transfer (R side) minA, verbal cues for hand placement. Patient returned to room, seated in wheelchair, seatbelt donned and all needs within reach, interpreter present.   Therapy Documentation Precautions:  Precautions Precautions: Fall Restrictions Weight Bearing Restrictions: No  Pain: Pain Assessment Pain Assessment: No/denies pain Pain Score: 0-No pain Mobility: Bed Mobility Bed Mobility: Sit to Supine Sit to Supine: 4: Min assist Sit to Supine - Details: Manual facilitation for placement Transfers Sit to Stand: 4: Min guard Stand to Sit: 4: Min assist Stand to Sit Details (indicate cue type and reason): Verbal cues for sequencing;Manual facilitation for placement Stand Pivot Transfers: 4: Min assist Stand Pivot  Transfer Details: Manual facilitation for weight shifting Locomotion : Ambulation Ambulation: Yes Ambulation/Gait Assistance: 1: +2 Total assist Ambulation Distance (Feet): 25 Feet (x2) Assistive device: Other (Comment) (L handrail ) Ambulation/Gait Assistance Details: Tactile cues for posture;Tactile cues for sequencing;Verbal cues for sequencing;Verbal cues for technique;Verbal cues for precautions/safety;Verbal cues for gait pattern;Manual facilitation for weight bearing Ambulation/Gait Assistance Details: Patient instructed in gait training 25'x2 with + 2totalA (for safety, actual gait modA) with L handrail. Manual faciliation for R weight shift and support above R knee to prevent knee buckling. Verbal cues for upright posture and appropriate pacing. During second gait trial verbal/tactile cues for decreased B step length.  Gait Gait: Yes Gait Pattern: Decreased step length - left;Decreased stance time - right;Decreased weight shift to right;Decreased dorsiflexion - right Stairs / Additional Locomotion Stairs: Yes Stairs Assistance: 1: +2 Total assist Stairs Assistance Details: Tactile cues for posture;Tactile cues for placement;Tactile cues for weight beaing;Verbal cues for sequencing;Verbal cues for technique;Verbal cues for precautions/safety;Verbal cues for gait pattern;Manual facilitation for weight bearing Stairs Assistance Details (indicate cue type and reason): Patient instructed in stair negotiation 5 steps +2TotalA, with 2 handrails. Manual facilitation provided above R knee to prevent knee buckling and to increase weight bearing through R LE. Verbal cues for hand placement, appropriate pacing, and ascending step with L LE and descending with R LE.  Stair Management Technique: Two rails;Step to pattern Number of Stairs: 5 Height of Stairs: 6 Wheelchair Mobility Wheelchair Mobility: No    Other Treatments: Weight Bearing Technique Weight Bearing Technique: Yes RUE Weight Bearing  Technique: Quadruped;High kneeling Response to Weight Bearing Technique: Patient instructed in tall kneeling with  B UE support on bench minA for acheiving position and supervision-minguard for stabilization, for increased R UE/LE weight bearing. Visual demonstration and verbal cues for acheiving position. Patient standing facing mat stepped up with L knee >step up R knee with B UE on bench for support. Patient instructed in tall kneeling>sidesitting>supine for rest break minA with verbal cues for instruction. Patient instructed in quadriped x2 trials minA for acheiving position and stabilization,for R UE/LE NMR, coordination and increased weight bearing. . Manual facilitation for placement of R UE/LE into proper alignement and weight shift towards R side. Verbal cues for placement, and technique.   See FIM for current functional status  Therapy/Group: Individual Therapy  Doralee Albino 02/05/2013, 4:31 PM

## 2013-02-05 NOTE — Progress Notes (Signed)
Occupational Therapy Session Note  Patient Details  Name: [name] MRN: [id number] Date of Birth: 28-Jul-1975  Today's Date: 02/05/2013 Time: 0730-0827 and 1308-6578 Time Calculation (min): 57 min and 28 min  Short Term Goals: Week 1:  OT Short Term Goal 1 (Week 1): Pt will complete LB dressing with supervision. OT Short Term Goal 2 (Week 1): Pt will complete toilet transfer with supervision. OT Short Term Goal 3 (Week 1): Pt will complete tub/shower transfer with supervision. OT Short Term Goal 4 (Week 1): Pt will complete bathing with min assist. OT Short Term Goal 5 (Week 1): Pt will complete simple meal prep with min assist with use of RUE as nondominant hand.  Skilled Therapeutic Interventions/Progress Updates:    1.) Pt seen for ADL retraining with focus on bathing, grooming, and dressing as well as standing balance, gross motor control and FMC with RUE.  Pt in bed upon arrival. Bed > w/c stand pivot Min A. Pt bathed in walk in shower. Transfer w/c > shower chair Mod A stand pivot with use of grab bars. Pt able to don dress, underwear, and socks, with steady assist with standing.Pt performed all grooming tasks at sink. In the gym Pt sit to stand Mod A with verbal cues to push off from mat with hands. Pt performed gross motor control task and FMC tasks (stacking cups and resistive clothes pins)  while standing using RUE with focus of weight shifting and crossing midline. Pt required MIn-Mod A while standing. Therapist noted ataxic movements when RUE was at end range and as Pt fatigued. Pt would take breaks due to decreased standing tolerance. Pt left in room in w/c with all needs in reach.   2.) Pt seen for 1:1 OT with focus on standing balance with functional tasks, FMC, and shoulder flexion with RUE. Pt in bed upon arrival. Pt able to get OOB with supervision. Bed > w/c stand pivot Min A. Pt donned both shoes before heading towards the gym. Once in gym therapist had Pt stand and complete  FMC tasks (pegs and checkers) with focus on weight shifting, gross motor control, and crossing midline. Pt required Min-Mod A sit > stand and Min A for static standing. Increased ataxic movements noted when Pt had RUE at end range and when crossing midline. Pt also completed [facility] with grasping and manipulating pennies with Right hand. Educated Pt on reaching back towards w/c arms when stand > sit. Pt left in gym waiting for arrival of PT.   Therapy Documentation Precautions:  Precautions Precautions: Fall Restrictions Weight Bearing Restrictions: No    Vital Signs: Therapy Vitals Temp: 98.1 F (36.7 C) Temp src: Oral Pulse Rate: 84 Resp: 20 BP: 100/68 mmHg Oxygen Therapy SpO2: 100 % O2 Device: None (Room air) Pain: Pain Assessment Pain Assessment: No/denies pain Pain Score: 0-No pain    See FIM for current functional status  Therapy/Group: Individual Therapy  Donna Christen 02/05/2013, 9:14 AM

## 2013-02-05 NOTE — Progress Notes (Signed)
Subjective/Complaints: 37 y.o. non-english speaking female with history of DM, HTN, who was admitted on 01/30/13 with right sided weakness and numbness. Last seen normal night---SBP 200 and CBG 307 at admission. MRI brain with acute nonhemorrhagic linear infarcts involving the lateral left thalamus and potentially internal capsule and MRA without significant stenosis. 2 D echo with EF 60-65% and no wall abnormalities. Carotid dopplers without significant ICA stenosis. Neurology recommends ASA 81 mg/daily for secondary stroke prevention as well as risk factor modification. Patient continues with right sided weakness with RLE instability poor weight shifting as well as decreased in coordination and blurred vision L>R.  Complain of bilateral leg pain overnight. States that occurs without movement. No chest pains or shortness of breath  Review of Systems - Negative except right side weakness  Objective: Vital Signs: Blood pressure 100/68, pulse 84, temperature 98.1 F (36.7 C), temperature source Oral, resp. rate 20, height 4\' 11"  (1.499 m), weight 71.6 kg (157 lb 13.6 oz), last menstrual period 02/03/2013, SpO2 100.00%. No results found. Results for orders placed during the hospital encounter of 02/03/13 (from the past 72 hour(s))  GLUCOSE, CAPILLARY     Status: Abnormal   Collection Time    02/03/13  9:25 PM      Result Value Range   Glucose-Capillary 236 (*) 70 - 99 mg/dL   Comment 1 Notify RN    CBC WITH DIFFERENTIAL     Status: Abnormal   Collection Time    02/04/13  5:48 AM      Result Value Range   WBC 5.1  4.0 - 10.5 K/uL   RBC 5.15 (*) 3.87 - 5.11 MIL/uL   Hemoglobin 13.8  12.0 - 15.0 g/dL   HCT 11.9  14.7 - 82.9 %   MCV 79.0  78.0 - 100.0 fL   MCH 26.8  26.0 - 34.0 pg   MCHC 33.9  30.0 - 36.0 g/dL   RDW 56.2  13.0 - 86.5 %   Platelets 251  150 - 400 K/uL   Neutrophils Relative % 49  43 - 77 %   Neutro Abs 2.5  1.7 - 7.7 K/uL   Lymphocytes Relative 36  12 - 46 %   Lymphs Abs  1.8  0.7 - 4.0 K/uL   Monocytes Relative 13 (*) 3 - 12 %   Monocytes Absolute 0.7  0.1 - 1.0 K/uL   Eosinophils Relative 2  0 - 5 %   Eosinophils Absolute 0.1  0.0 - 0.7 K/uL   Basophils Relative 0  0 - 1 %   Basophils Absolute 0.0  0.0 - 0.1 K/uL  COMPREHENSIVE METABOLIC PANEL     Status: Abnormal   Collection Time    02/04/13  5:48 AM      Result Value Range   Sodium 137  135 - 145 mEq/L   Potassium 4.3  3.5 - 5.1 mEq/L   Chloride 105  96 - 112 mEq/L   CO2 23  19 - 32 mEq/L   Glucose, Bld 191 (*) 70 - 99 mg/dL   BUN 11  6 - 23 mg/dL   Creatinine, Ser 7.84  0.50 - 1.10 mg/dL   Calcium 9.1  8.4 - 69.6 mg/dL   Total Protein 6.7  6.0 - 8.3 g/dL   Albumin 2.9 (*) 3.5 - 5.2 g/dL   AST 39 (*) 0 - 37 U/L   ALT 62 (*) 0 - 35 U/L   Alkaline Phosphatase 68  39 - 117 U/L  Total Bilirubin 0.8  0.3 - 1.2 mg/dL   GFR calc non Af Amer >90  >90 mL/min   GFR calc Af Amer >90  >90 mL/min   Comment: (NOTE)     The eGFR has been calculated using the CKD EPI equation.     This calculation has not been validated in all clinical situations.     eGFR's persistently <90 mL/min signify possible Chronic Kidney     Disease.  GLUCOSE, CAPILLARY     Status: Abnormal   Collection Time    02/04/13  7:29 AM      Result Value Range   Glucose-Capillary 188 (*) 70 - 99 mg/dL  GLUCOSE, CAPILLARY     Status: Abnormal   Collection Time    02/04/13 11:27 AM      Result Value Range   Glucose-Capillary 239 (*) 70 - 99 mg/dL  GLUCOSE, CAPILLARY     Status: Abnormal   Collection Time    02/04/13  4:20 PM      Result Value Range   Glucose-Capillary 223 (*) 70 - 99 mg/dL   Comment 1 Notify RN    GLUCOSE, CAPILLARY     Status: Abnormal   Collection Time    02/04/13  8:01 PM      Result Value Range   Glucose-Capillary 206 (*) 70 - 99 mg/dL  GLUCOSE, CAPILLARY     Status: Abnormal   Collection Time    02/05/13  7:40 AM      Result Value Range   Glucose-Capillary 174 (*) 70 - 99 mg/dL   Comment 1 Notify RN         Physical Exam  Constitutional: She is oriented to person, place, and time. She appears well-developed and well-nourished.  HENT:  Head: Normocephalic and atraumatic.  Eyes: Conjunctivae and EOM are normal. Pupils are equal, round, and reactive to light.  Neck: No tracheal deviation present. No thyromegaly present.  Cardiovascular: Normal rate. Regular rhythm. Right leg with mild ant tibial tenderness, no edema. Homans sign-  Respiratory: Effort normal.  Neurological: She is alert and oriented to person, place, and time. Has good insight and awareness. Answered all basic questions relatively appropriate despite language issues. Good sitting balance. Speech generally clear.  RUE 3+ to 4 deltoid, bicep, tricep, wrist, HI, RLE 3+HF, 4-KE, 4 ADF/APF. Sensory 1 to 1+/2 right face, arm, leg.  Psychiatric:  Mood and affect  appropriate.    Assessment/Plan: 1. Functional deficits secondary to right hemiparesis secondary to left thalamic and left internal capsule lacunar infarcts which require 3+ hours per day of interdisciplinary therapy in a comprehensive inpatient rehab setting. Physiatrist is providing close team supervision and 24 hour management of active medical problems listed below. Physiatrist and rehab team continue to assess barriers to discharge/monitor patient progress toward functional and medical goals. FIM: FIM - Bathing Bathing Steps Patient Completed: Chest;Right Arm;Left Arm;Abdomen;Front perineal area;Right upper leg;Left upper leg;Right lower leg (including foot);Left lower leg (including foot);Buttocks Bathing: 5: Supervision: Safety issues/verbal cues  FIM - Upper Body Dressing/Undressing Upper body dressing/undressing steps patient completed: Thread/unthread left sleeve of pullover shirt/dress;Put head through opening of pull over shirt/dress;Thread/unthread right sleeve of pullover shirt/dresss;Pull shirt over trunk Upper body dressing/undressing: 4: Steadying  assist FIM - Lower Body Dressing/Undressing Lower body dressing/undressing steps patient completed: Thread/unthread right underwear leg;Thread/unthread left underwear leg;Don/Doff left sock;Don/Doff right shoe;Pull underwear up/down Lower body dressing/undressing: 4: Steadying Assist  FIM - Toileting Toileting steps completed by patient: Adjust clothing prior to toileting;Performs  perineal hygiene;Adjust clothing after toileting Toileting Assistive Devices: Grab bar or rail for support Toileting: 4: Steadying assist  FIM - Diplomatic Services operational officer Devices: Grab bars Toilet Transfers: 4-To toilet/BSC: Min A (steadying Pt. > 75%);3-From toilet/BSC: Mod A (lift or lower assist)  FIM - Bed/Chair Transfer Bed/Chair Transfer Assistive Devices: Arm rests Bed/Chair Transfer: 7: Supine > Sit: No assist;4: Bed > Chair or W/C: Min A (steadying Pt. > 75%)  FIM - Locomotion: Wheelchair Distance: 150 Locomotion: Wheelchair: 1: Total Assistance/staff pushes wheelchair (Pt<25%) FIM - Locomotion: Ambulation Locomotion: Ambulation Assistive Devices: Other (comment) (HHA) Ambulation/Gait Assistance: 1: +2 Total assist Locomotion: Ambulation: 1: Two helpers     Expression Expression Mode: Verbal Expression: 5-Expresses basic needs/ideas: With no assist  Social Interaction Social Interaction: 5-Interacts appropriately 90% of the time - Needs monitoring or encouragement for participation or interaction.  Problem Solving Problem Solving: 5-Solves basic problems: With no assist     Medical Problem List and Plan:  1. Left thalamic internal capsule infarct likely due to small vessel disease  2. DVT Prophylaxis/Anticoagulation: SCDs. Monitor for any signs of DVT  , has mild leg pain but no other signs of DVT  3. Pain Management: Tylenol as needed and improved on gabapentin for neuropathy pain, may also have thalamic pain 4. Neuropsych: This patient is capable of making decisions  on her own behalf.  5. Hypertension. Lisinopril 5 mg daily. Monitor with increased activity  6. Diabetes mellitus with peripheral neuropathy. Hemoglobin A1c 13.1.lantus insulin titrate. Check blood sugars a.c. and at bedtime   LOS (Days) 2 A FACE TO FACE EVALUATION WAS PERFORMED  KIRSTEINS,ANDREW E 02/05/2013, 9:53 AM

## 2013-02-05 NOTE — Progress Notes (Signed)
Inpatient Diabetes Program Recommendations  AACE/ADA: New Consensus Statement on Inpatient Glycemic Control (2013)  Target Ranges:  Prepandial:   less than 140 mg/dL      Peak postprandial:   less than 180 mg/dL (1-2 hours)      Critically ill patients:  140 - 180 mg/dL   Hyperglycemia following meals  Inpatient Diabetes Program Recommendations Insulin - Basal: xxxx Correction (SSI): xxx Insulin - Meal Coverage: Please add 3-4 units novolog meal coverage to start.  Thank you, Lenor Coffin, RN, CNS, Diabetes Coordinator [phone])

## 2013-02-05 NOTE — IPOC Note (Signed)
Overall Plan of Care [facility]) Patient Details Name: [name] MRN: [id number] DOB: [birth year]  Admitting Diagnosis: CVA  Hospital Problems: Principal Problem:   CVA (cerebral infarction)  uncontrolled diabetes    Functional Problem List: Nursing Motor;Perception;Sensory;Medication Management  PT Balance;Motor;Pain;Sensory  OT Balance;Motor;Perception;Sensory;Vision  SLP    TR         Basic ADL's: OT Grooming;Bathing;Dressing;Toileting     Advanced  ADL's: OT Simple Meal Preparation;Laundry;Light Housekeeping     Transfers: PT Bed Mobility;Bed to Chair;Car;Furniture;Floor  OT Toilet;Tub/Shower     Locomotion: PT Ambulation;Stairs     Additional Impairments: OT Fuctional Use of Upper Extremity  SLP        TR      Anticipated Outcomes Item Anticipated Outcome  Self Feeding independent  Swallowing      Basic self-care  mod I  Toileting  mod I   Bathroom Transfers mod I  Bowel/Bladder  Remain continent of bowel and bladder  Transfers  Mod I   Locomotion  Mod I household gait, supervision in controlled and community environments  Communication     Cognition     Pain  < 3  Safety/Judgment  Patient will follow safety plan with min assist   Therapy Plan: PT Intensity: Minimum of 1-2 x/day ,45 to 90 minutes PT Frequency: 5 out of 7 days PT Duration Estimated Length of Stay: 2 weeks OT Intensity: Minimum of 1-2 x/day, 45 to 90 minutes OT Frequency: 5 out of 7 days OT Duration/Estimated Length of Stay: 2 weeks         Team Interventions: Nursing Interventions Patient/Family Education;Disease Management/Prevention;Medication Management  PT interventions Ambulation/gait training;Balance/vestibular training;Community reintegration;Discharge planning;DME/adaptive equipment instruction;Functional electrical stimulation;Functional mobility training;Neuromuscular re-education;Pain management;Patient/family education;Splinting/orthotics;Stair  training;Therapeutic Activities;Therapeutic Exercise;UE/LE Strength taining/ROM;UE/LE Coordination activities;Visual/perceptual remediation/compensation  OT Interventions Balance/vestibular training;Discharge planning;Community reintegration;Disease Conservator, museum/gallery;Functional mobility training;Neuromuscular re-education;Pain management;Patient/family education;Psychosocial support;Self Care/advanced ADL retraining;Therapeutic Activities;Therapeutic Exercise;UE/LE Strength taining/ROM;UE/LE Coordination activities;Visual/perceptual remediation/compensation  SLP Interventions    TR Interventions    SW/CM Interventions      Team Discharge Planning: Destination: PT-Home ,OT- Home , SLP-  Projected Follow-up: PT-Home health PT, OT-  Home health OT;Outpatient OT, SLP-  Projected Equipment Needs: PT-Cane;Rolling walker with 5" wheels, OT- Tub/shower bench, SLP-  Patient/family involved in discharge planning: PT- Patient,  OT-Patient, SLP-   MD ELOS: 10-12d  Medical Rehab Prognosis:  Excellent Assessment: 37 y.o. non-english speaking female with history of DM, HTN, who was admitted on 01/30/13 with right sided weakness and numbness. Last seen normal night---SBP 200 and CBG 307 at admission. MRI brain with acute nonhemorrhagic linear infarcts involving the lateral left thalamus and potentially internal capsule and MRA without significant stenosis. 2 D echo with EF 60-65% and no wall abnormalities. Carotid dopplers without significant ICA stenosis. Neurology recommends ASA 81 mg/daily for secondary stroke prevention as well as risk factor modification. Patient continues with right sided weakness with RLE instability poor weight shifting as well as decreased in coordination and blurred vision L>R  Now requiring 24/7 Rehab RN,MD, as well as CIR level PT, OT and SLP.  Treatment team will focus on ADLs and mobility with goals set at Mod I    See Team Conference Notes  for weekly updates to the plan of care

## 2013-02-05 NOTE — Progress Notes (Signed)
I have reviewed and agree with the attached treatment note.  [name]  

## 2013-02-05 NOTE — Patient Care Conference (Signed)
Inpatient RehabilitationTeam Conference and Plan of Care Update Date: 02/04/2013   Time: 11:15 AM    Patient Name: [name]      Medical Record Number: [id number]  Date of Birth: [birth year] Sex: Female         Room/Bed: 4W12C/4W12C-01 Payor Info: Payor: BLUE CROSS BLUE SHIELD / Plan: [employer] [place] PPO / Product Type: *No Product type* /    Admitting Diagnosis: CVA  Admit Date/Time:  02/03/2013  6:11 PM Admission Comments: No comment available   Primary Diagnosis:  CVA (cerebral infarction) Principal Problem: CVA (cerebral infarction)  Patient Active Problem List   Diagnosis Date Noted  . CVA (cerebral infarction) 02/04/2013  . Acute ischemic stroke 01/31/2013  . Hypokalemia 01/31/2013  . Right sided weakness 01/30/2013  . HTN (hypertension) 01/30/2013  . DM (diabetes mellitus) 01/30/2013    Expected Discharge Date: Expected Discharge Date: 02/18/13  Team Members Present: Physician leading conference: Dr. Claudette Laws Social Worker Present: Staci Acosta, LCSW Nurse Present: Other (comment) Ronny Bacon, RN) PT Present: Harriet Butte, Varney Biles, PT OT Present: Rosalio Loud, Loistine Chance, OT SLP Present: Fae Pippin, SLP PPS Coordinator present : Tora Duck, RN, CRRN;Becky Henrene Dodge, PT     Current Status/Progress Goal Weekly Team Focus  Medical   History of uncontrolled diabetes  Achieve adequate blood sugar management  Medication adjustment, dietary education   Bowel/Bladder   Continent of bowel and bladder  Remain continent of bowel and bladder      Swallow/Nutrition/ Hydration             ADL's   min-mod assist transfers, mod assist bathing, min assist bathing, supervision grooming  supervision - mod I  standing balance, RUE strength and coordination, FMC   Mobility   Min-mod A overall  Supervision-mod I  motor control R side, transfers, gait   Communication             Safety/Cognition/ Behavioral Observations            Pain   Complains of  pain on right side from head to toe- new orders placed to start neurontin  < 3  Monitor, assess, and treat for pain q shift and prn   Skin   No breakdown or infection  Remain free from infection or breakdown  Assess patients skin q shift and prn    Rehab Goals Patient on target to meet rehab goals: Yes Rehab Goals Revised: Pt was just evaluated today, on day of conference. *See Care Plan and progress notes for long and short-term goals.  Barriers to Discharge: Still requires physical assistance, question if 24 hour care is available post discharge    Possible Resolutions to Barriers:  Continue rehabilitation program, social work assessment    Discharge Planning/Teaching Needs:  Pt  plans to return home at d/c.  Brother will need to come for family education closer to d/c.   Team Discussion:  Pt has right hemiparesis and diabetic neuropathy.  She is somewhat nervous with therapies, as she doesn't trust her right side.  She has some motor planning issues.  Pt lives with her brother who works second shift and her mother who had a stroke 12 years ago and does not have full use of her right hand.  Pt was doing most of the household chores.  Pt reported to CSW that her mother can do some light housekeeping and cooking.    Revisions to Treatment Plan:  Pt was just evaluated on date of conference.  Goals  were set for supervision - modified independent.  D/C date set for 02-18-13.   Continued Need for Acute Rehabilitation Level of Care: The patient requires daily medical management by a physician with specialized training in physical medicine and rehabilitation for the following conditions: Daily direction of a multidisciplinary physical rehabilitation program to ensure safe treatment while eliciting the highest outcome that is of practical value to the patient.: Yes Daily medical management of patient stability for increased activity during participation in an intensive rehabilitation regime.:  Yes Daily analysis of laboratory values and/or radiology reports with any subsequent need for medication adjustment of medical intervention for : Neurological problems;Other  Nhi Butrum, Vista Deck 02/05/2013, 2:04 PM

## 2013-02-05 NOTE — Plan of Care (Signed)
Problem: RH KNOWLEDGE DEFICIT Goal: RH STG INCREASE KNOWLEDGE OF DIABETES Patient will demonstrate understanding of diabetes and importance of checking and managing blood sugars with mod assist.  Outcome: Not Progressing Patient requires total assist to manage blood sugars . Goal: RH STG INCREASE KNOWLEDGE OF HYPERTENSION Patient will demonstrate and verbalize understanding of HTN, importance of consistently taking medications, checking blood pressure with mod assist.  Outcome: Not Progressing Requires total assist

## 2013-02-05 NOTE — Progress Notes (Signed)
I have reviewed and agree with the treatment as reflected in this note. Minoru Chap, PT DPT  

## 2013-02-06 ENCOUNTER — Inpatient Hospital Stay (HOSPITAL_COMMUNITY): Payer: BC Managed Care – PPO | Admitting: Occupational Therapy

## 2013-02-06 ENCOUNTER — Inpatient Hospital Stay (HOSPITAL_COMMUNITY): Payer: BC Managed Care – PPO | Admitting: Physical Therapy

## 2013-02-06 DIAGNOSIS — I6330 Cerebral infarction due to thrombosis of unspecified cerebral artery: Secondary | ICD-10-CM

## 2013-02-06 LAB — GLUCOSE, CAPILLARY
Glucose-Capillary: 163 mg/dL — ABNORMAL HIGH (ref 70–99)
Glucose-Capillary: 257 mg/dL — ABNORMAL HIGH (ref 70–99)
Glucose-Capillary: 140 mg/dL — ABNORMAL HIGH (ref 70–99)
Glucose-Capillary: 202 mg/dL — ABNORMAL HIGH (ref 70–99)

## 2013-02-06 NOTE — Progress Notes (Signed)
I have reviewed and agree with the attached treatment note.  Tyreona Panjwani, OTR/L 

## 2013-02-06 NOTE — Progress Notes (Signed)
Subjective/Complaints: 37 y.o. non-english speaking female with history of DM, HTN, who was admitted on 01/30/13 with right sided weakness and numbness. Last seen normal night---SBP 200 and CBG 307 at admission. MRI brain with acute nonhemorrhagic linear infarcts involving the lateral left thalamus and potentially internal capsule and MRA without significant stenosis. 2 D echo with EF 60-65% and no wall abnormalities. Carotid dopplers without significant ICA stenosis. Neurology recommends ASA 81 mg/daily for secondary stroke prevention as well as risk factor modification. Patient continues with right sided weakness with RLE instability poor weight shifting as well as decreased in coordination and blurred vision L>R.  Left hip/thigh sore overnight and this am.   Review of Systems - Negative except right side weakness  Objective: Vital Signs: Blood pressure 121/87, pulse 90, temperature 97.7 F (36.5 C), temperature source Oral, resp. rate 20, height 4\' 11"  (1.499 m), weight 71.6 kg (157 lb 13.6 oz), last menstrual period 02/03/2013, SpO2 98.00%. No results found. Results for orders placed during the hospital encounter of 02/03/13 (from the past 72 hour(s))  GLUCOSE, CAPILLARY     Status: Abnormal   Collection Time    02/03/13  9:25 PM      Result Value Range   Glucose-Capillary 236 (*) 70 - 99 mg/dL   Comment 1 Notify RN    CBC WITH DIFFERENTIAL     Status: Abnormal   Collection Time    02/04/13  5:48 AM      Result Value Range   WBC 5.1  4.0 - 10.5 K/uL   RBC 5.15 (*) 3.87 - 5.11 MIL/uL   Hemoglobin 13.8  12.0 - 15.0 g/dL   HCT 45.4  09.8 - 11.9 %   MCV 79.0  78.0 - 100.0 fL   MCH 26.8  26.0 - 34.0 pg   MCHC 33.9  30.0 - 36.0 g/dL   RDW 14.7  82.9 - 56.2 %   Platelets 251  150 - 400 K/uL   Neutrophils Relative % 49  43 - 77 %   Neutro Abs 2.5  1.7 - 7.7 K/uL   Lymphocytes Relative 36  12 - 46 %   Lymphs Abs 1.8  0.7 - 4.0 K/uL   Monocytes Relative 13 (*) 3 - 12 %   Monocytes  Absolute 0.7  0.1 - 1.0 K/uL   Eosinophils Relative 2  0 - 5 %   Eosinophils Absolute 0.1  0.0 - 0.7 K/uL   Basophils Relative 0  0 - 1 %   Basophils Absolute 0.0  0.0 - 0.1 K/uL  COMPREHENSIVE METABOLIC PANEL     Status: Abnormal   Collection Time    02/04/13  5:48 AM      Result Value Range   Sodium 137  135 - 145 mEq/L   Potassium 4.3  3.5 - 5.1 mEq/L   Chloride 105  96 - 112 mEq/L   CO2 23  19 - 32 mEq/L   Glucose, Bld 191 (*) 70 - 99 mg/dL   BUN 11  6 - 23 mg/dL   Creatinine, Ser 1.30  0.50 - 1.10 mg/dL   Calcium 9.1  8.4 - 86.5 mg/dL   Total Protein 6.7  6.0 - 8.3 g/dL   Albumin 2.9 (*) 3.5 - 5.2 g/dL   AST 39 (*) 0 - 37 U/L   ALT 62 (*) 0 - 35 U/L   Alkaline Phosphatase 68  39 - 117 U/L   Total Bilirubin 0.8  0.3 - 1.2 mg/dL  GFR calc non Af Amer >90  >90 mL/min   GFR calc Af Amer >90  >90 mL/min   Comment: (NOTE)     The eGFR has been calculated using the CKD EPI equation.     This calculation has not been validated in all clinical situations.     eGFR's persistently <90 mL/min signify possible Chronic Kidney     Disease.  GLUCOSE, CAPILLARY     Status: Abnormal   Collection Time    02/04/13  7:29 AM      Result Value Range   Glucose-Capillary 188 (*) 70 - 99 mg/dL  GLUCOSE, CAPILLARY     Status: Abnormal   Collection Time    02/04/13 11:27 AM      Result Value Range   Glucose-Capillary 239 (*) 70 - 99 mg/dL  GLUCOSE, CAPILLARY     Status: Abnormal   Collection Time    02/04/13  4:20 PM      Result Value Range   Glucose-Capillary 223 (*) 70 - 99 mg/dL   Comment 1 Notify RN    GLUCOSE, CAPILLARY     Status: Abnormal   Collection Time    02/04/13  8:01 PM      Result Value Range   Glucose-Capillary 206 (*) 70 - 99 mg/dL  GLUCOSE, CAPILLARY     Status: Abnormal   Collection Time    02/05/13  7:40 AM      Result Value Range   Glucose-Capillary 174 (*) 70 - 99 mg/dL   Comment 1 Notify RN    GLUCOSE, CAPILLARY     Status: Abnormal   Collection Time     02/05/13 11:52 AM      Result Value Range   Glucose-Capillary 283 (*) 70 - 99 mg/dL   Comment 1 Notify RN    GLUCOSE, CAPILLARY     Status: Abnormal   Collection Time    02/05/13  4:26 PM      Result Value Range   Glucose-Capillary 218 (*) 70 - 99 mg/dL   Comment 1 Notify RN    GLUCOSE, CAPILLARY     Status: Abnormal   Collection Time    02/05/13  8:08 PM      Result Value Range   Glucose-Capillary 176 (*) 70 - 99 mg/dL   Comment 1 Notify RN    GLUCOSE, CAPILLARY     Status: Abnormal   Collection Time    02/06/13  7:18 AM      Result Value Range   Glucose-Capillary 163 (*) 70 - 99 mg/dL   Comment 1 Notify RN        Physical Exam  Constitutional: She is oriented to person, place, and time. She appears well-developed and well-nourished.  HENT:  Head: Normocephalic and atraumatic.  Eyes: Conjunctivae and EOM are normal. Pupils are equal, round, and reactive to light.  Neck: No tracheal deviation present. No thyromegaly present.  Cardiovascular: Normal rate. Regular rhythm. Right leg with mild ant tibial tenderness, no edema. Homans sign-  Respiratory: Effort normal.  Neurological: She is alert and oriented to person, place, and time. Has good insight and awareness. Answered all basic questions relatively appropriate despite language issues. Good sitting balance. Speech generally clear.  RUE 3+ to 4 deltoid, bicep, tricep, wrist, HI, RLE 3+HF, 4-KE, 4 ADF/APF. Sensory 1 to 1+/2 right face, arm, leg.  Psychiatric:  Mood and affect  appropriate.  Musc: left thigh/hip flexors sore with touch. No swelling or warmth.  Assessment/Plan: 1. Functional deficits secondary to right hemiparesis secondary to left thalamic and left internal capsule lacunar infarcts which require 3+ hours per day of interdisciplinary therapy in a comprehensive inpatient rehab setting. Physiatrist is providing close team supervision and 24 hour management of active medical problems listed below. Physiatrist  and rehab team continue to assess barriers to discharge/monitor patient progress toward functional and medical goals. FIM: FIM - Bathing Bathing Steps Patient Completed: Chest;Right Arm;Left Arm;Abdomen;Front perineal area;Right upper leg;Left upper leg;Right lower leg (including foot);Left lower leg (including foot);Buttocks Bathing: 5: Set-up assist to: Obtain items  FIM - Upper Body Dressing/Undressing Upper body dressing/undressing steps patient completed: Thread/unthread left sleeve of pullover shirt/dress;Put head through opening of pull over shirt/dress;Thread/unthread right sleeve of pullover shirt/dresss;Pull shirt over trunk Upper body dressing/undressing: 4: Steadying assist FIM - Lower Body Dressing/Undressing Lower body dressing/undressing steps patient completed: Thread/unthread right underwear leg;Thread/unthread left underwear leg;Don/Doff left sock;Don/Doff right shoe;Pull underwear up/down;Don/Doff left shoe Lower body dressing/undressing: 4: Steadying Assist  FIM - Toileting Toileting steps completed by patient: Adjust clothing prior to toileting;Performs perineal hygiene;Adjust clothing after toileting Toileting Assistive Devices: Grab bar or rail for support Toileting: 4: Steadying assist  FIM - Diplomatic Services operational officer Devices: Grab bars Toilet Transfers: 4-To toilet/BSC: Min A (steadying Pt. > 75%);3-From toilet/BSC: Mod A (lift or lower assist)  FIM - Bed/Chair Transfer Bed/Chair Transfer Assistive Devices: Arm rests Bed/Chair Transfer: 5: Supine > Sit: Supervision (verbal cues/safety issues);5: Sit > Supine: Supervision (verbal cues/safety issues);4: Bed > Chair or W/C: Min A (steadying Pt. > 75%);4: Chair or W/C > Bed: Min A (steadying Pt. > 75%)  FIM - Locomotion: Wheelchair Distance: 150 Locomotion: Wheelchair: 1: Total Assistance/staff pushes wheelchair (Pt<25%) FIM - Locomotion: Ambulation Locomotion: Ambulation Assistive Devices: Other  (comment) (L handrail ) Ambulation/Gait Assistance: 1: +2 Total assist Locomotion: Ambulation: 1: Two helpers  Comprehension Comprehension Mode: Auditory Comprehension: 5-Follows basic conversation/direction: With no assist (interpretor for education patient nods head and speaks little English )  Expression Expression Mode: Verbal Expression: 5-Expresses basic needs/ideas: With no assist  Social Interaction Social Interaction: 5-Interacts appropriately 90% of the time - Needs monitoring or encouragement for participation or interaction.  Problem Solving Problem Solving: 5-Solves basic problems: With no assist  Memory Memory: 5-Recognizes or recalls 90% of the time/requires cueing < 10% of the time  Medical Problem List and Plan:  1. Left thalamic internal capsule infarct likely due to small vessel disease  2. DVT Prophylaxis/Anticoagulation: SCDs. Monitor for any signs of DVT  , has mild leg pain but no other signs of DVT  3. Pain Management: Tylenol as needed and improved on gabapentin for neuropathy pain, may also have thalamic pain  -left thigh appears musculoskeletal. Use tylenol/heat 4. Neuropsych: This patient is capable of making decisions on her own behalf.  5. Hypertension. Lisinopril 5 mg daily. Monitor with increased activity  6. Diabetes mellitus with peripheral neuropathy. Hemoglobin A1c 13.1.lantus insulin titrated up yesterday.   -sugars still need better control  LOS (Days) 3 A FACE TO FACE EVALUATION WAS PERFORMED  Tarris Delbene T 02/06/2013, 9:14 AM

## 2013-02-06 NOTE — Progress Notes (Signed)
Physical Therapy Session Note  Patient Details  Name: [name] MRN: [id number] Date of Birth: 1975/06/30  Today's Date: 02/06/2013 Time: 1000-1100 and 1423-1500 Time Calculation (min): 60 min and 37 min  Short Term Goals: Week 1:  PT Short Term Goal 1 (Week 1): Pt will peform bed mobility on flat bed, no rail with supervision PT Short Term Goal 2 (Week 1): Pt will perform bed <> w/c transfers with min A to L and R side PT Short Term Goal 3 (Week 1): Pt will perform gait in controlled environment x 100' min A PT Short Term Goal 4 (Week 1): Pt will negotiate 3 stairs with one rail and min A  Skilled Therapeutic Interventions/Progress Updates:   Pt resting in bed; still c/o lateral LLE pain/discomfort; RN notified for pain meds and questioned about use of KPAD or muscle rub cream for pain.  RN reports PA feels this pain is related to pt neuropathy but will order KPAD for comfort.  Pt performed supine > sit supervision and sat EOB and donned bilat socks and shoes with supervision.  Performed gait training at beginning and end of session x 150' with RW and R hand orthosis and mod A overall with tactile cues to facilitate upright trunk posture, increasing step length bilaterally and maintaining forward momentum with anterior weight shift over RLE with tactile cues at pelvis/hips and cues to maintain safe distance to RW.  Second repetition pt demonstrated more consistent gait velocity and decreased step to sequencing and pt demonstrated carry over of active R ankle DF during swing and heel strike to minimize foot drag.  During second repetition pt became very fatigued and demonstrated increased RLE foot drag and decreased ability to advance RLE; returned to room in w/c.  Performed NMR in gym; see below for details.  PM session:   Pt resting in bed; supine <> sit EOB and donning of shoes EOB with supervision.  Performed gait x 150' with RW and R hand orthosis in controlled environment with mod A/tactile  cues to maintain upright trunk posture and gaze and facilitation of lateral and anterior weight shifting to maintain forward momentum, full step length bilaterally for more efficient gait sequencing.  Pt demonstrating improved step and stride length and more controlled stepping sequence.  Performed NMR for re-education of weight shifting and activation of R hip and knee flexion and ankle DF for swing phase with alternating LE toe taps to 2' block in various patterns and then to cones beginning in sitting progressing to standing with UE support on RW.  Transitioned to stair negotiation training with bilat UE support on rails and performing up/down 5 stairs with alternating sequence to focus on RLE swing/foot clearance to next step and RLE extension activation and forward weight shift over RLE during stance.  Returned to room in w/c and performed stand pivot min HHA and sit > supine supervision.   Therapy Documentation Precautions:  Precautions Precautions: Fall Restrictions Weight Bearing Restrictions: No Vital Signs: Therapy Vitals BP: 121/87 mmHg Pain: Pain Assessment Pain Assessment: Faces Pain Score: 3  Faces Pain Scale: Hurts little more Pain Type: Acute pain Pain Location: Leg Pain Orientation: Left;Lateral Pain Descriptors / Indicators: Discomfort Pain Intervention(s): RN made aware;Heat applied Locomotion : Ambulation Ambulation/Gait Assistance: 3: Mod assist  Other Treatments: Treatments Neuromuscular Facilitation: Right;Lower Extremity;Upper Extremity;Activity to increase coordination;Activity to increase motor control;Activity to increase timing and sequencing;Activity to increase grading;Activity to increase sustained activation;Activity to increase lateral weight shifting;Activity to increase anterior-posterior weight shifting  beginning in supine with moist heat over L thigh for comfort and RLE performing Frenkel exercises for coordination in closed and open chain progressing to  tall kneeling with UE support on bench with focus on lateral and anterior active weight shifting with bilat UE support >> reaching with RUE up, forwards and to the R to facilitate increased R lateral and controlled anterior weight shift with tactile cues for trunk control and proximal stability during reaching.  Pt required two rest breaks secondary to LLE pain.    See FIM for current functional status  Therapy/Group: Individual Therapy  Edman Circle [facility] 02/06/2013, 12:27 PM

## 2013-02-06 NOTE — Progress Notes (Signed)
Inpatient Diabetes Program Recommendations  AACE/ADA: New Consensus Statement on Inpatient Glycemic Control (2013)  Target Ranges:  Prepandial:   less than 140 mg/dL      Peak postprandial:   less than 180 mg/dL (1-2 hours)      Critically ill patients:  140 - 180 mg/dL   Elevated post-prandial glucose levels:  Inpatient Diabetes Program Recommendations Insulin - Basal: xxxx Correction (SSI): xxx Insulin - Meal Coverage: Please add 3-4 units novolog meal coverage to start. Thank you, Lenor Coffin, RN, CNS, Diabetes Coordinator [phone])

## 2013-02-06 NOTE — Progress Notes (Signed)
Occupational Therapy Session Note  Patient Details  Name: [name] MRN: [id number] Date of Birth: Jan 26, 1976  Today's Date: 02/06/2013 Time: 0731-0826 and [id number] Time Calculation (min): 55 min and 28 min  Short Term Goals: Week 1:  OT Short Term Goal 1 (Week 1): Pt will complete LB dressing with supervision. OT Short Term Goal 2 (Week 1): Pt will complete toilet transfer with supervision. OT Short Term Goal 3 (Week 1): Pt will complete tub/shower transfer with supervision. OT Short Term Goal 4 (Week 1): Pt will complete bathing with min assist. OT Short Term Goal 5 (Week 1): Pt will complete simple meal prep with min assist with use of RUE as nondominant hand.  Skilled Therapeutic Interventions/Progress Updates:    1.) Pt seen for ADL retraining with focus on standing balance while performing grooming tasks and LB dressing. Pt sitting up in w/c at sink upon arrival. Pt performed oral care in standing with Min A for standing balance. Pt performed bathing in walk in shower.Pt able to transfer w/c > shower chair with supervision by grasping shower chair with LUE and performing squat pivot to chair.Pt required Min A from shower chair > w/c due to wet floor and transferring to weaker side. Pt able to stand Min A to dry and comb hair in front of sink. In gym Pt performed gross motor tasks with RUE in standing while crossing midline and weight shifting requiring Min-Mod A for standing balance. Pt grasped objects from floor using RUE, bending at the hips to reach.Pt would often hyperextend her left knee while standing due to decreased balance and fear of falling. Pt reported her LLE hurt when standing.Therapist educated Pt on the fact she hyperextends knee, discussed sanding positions to decrease hyperextension of knee. Pt required rest breaks between activities.Therapist noted ataxic movements when reaching for items, however noted ataxia decreased when visually attending to task. Educated Pt on her  visually attending to RUE when reaching for objects to decrease ataxic movements. Pt left in room sitting in w/c with all needs in reach.   2.) Pt seen 1:1 OT with focus on standing balance, weight bearing through RUE, weight shifting, crossing midline with BUE and strengthening Right hand. Pt in w/c in room upon arrival. In gym Pt completed gross motor control tasks while standing, weight bearing through RUE. After weight bearing, Pt completed gross motor task reaching with RUE, crossing midline. Therapist applied approx 1lb wrist weight to RUE to decrease ataxic movements, Pt demonstrated decreased ataxic movements with weight while reaching, however ataxia persisted. Pt completed hand strengthening exercises using soft resistive theraputty. Plan to educate Pt on more exercises and provide handout.  Therapy Documentation Precautions:  Precautions Precautions: Fall Restrictions Weight Bearing Restrictions: No General:   Vital Signs: Therapy Vitals Temp: 97.7 F (36.5 C) Temp src: Oral Pulse Rate: 90 Resp: 20 BP: 121/87 mmHg Patient Position, if appropriate: Lying Oxygen Therapy SpO2: 98 % O2 Device: None (Room air)  See FIM for current functional status  Therapy/Group: Individual Therapy  Donna Christen 02/06/2013, 9:10 AM

## 2013-02-07 ENCOUNTER — Inpatient Hospital Stay (HOSPITAL_COMMUNITY): Payer: BC Managed Care – PPO | Admitting: Physical Therapy

## 2013-02-07 ENCOUNTER — Inpatient Hospital Stay (HOSPITAL_COMMUNITY): Payer: BC Managed Care – PPO | Admitting: Occupational Therapy

## 2013-02-07 LAB — GLUCOSE, CAPILLARY
Glucose-Capillary: 142 mg/dL — ABNORMAL HIGH (ref 70–99)
Glucose-Capillary: 179 mg/dL — ABNORMAL HIGH (ref 70–99)
Glucose-Capillary: 148 mg/dL — ABNORMAL HIGH (ref 70–99)
Glucose-Capillary: 130 mg/dL — ABNORMAL HIGH (ref 70–99)

## 2013-02-07 NOTE — Progress Notes (Signed)
Physical Therapy Session Note  Patient Details  Name: [name] MRN: [id number] Date of Birth: [birth year]  Today's Date: 02/07/2013 Time: 0900-1000 Time Calculation (min): 60 min  Short Term Goals: Week 1:  PT Short Term Goal 1 (Week 1): Pt will peform bed mobility on flat bed, no rail with supervision PT Short Term Goal 2 (Week 1): Pt will perform bed <> w/c transfers with min A to L and R side PT Short Term Goal 3 (Week 1): Pt will perform gait in controlled environment x 100' min A PT Short Term Goal 4 (Week 1): Pt will negotiate 3 stairs with one rail and min A   Therapy Documentation Precautions:  Precautions Precautions: Fall Restrictions Weight Bearing Restrictions: No Pain: denies pain  Therapeutic Activity:(15') Bed mobility supine to sit and scoot to EOB with rail S/Mod-I, Transfers Sit<->stand S/Mod-I using RW and verbal cues for reaching hand back to sitting surface 1x and then good carryover throughout session Therapeutic Exercise:(30') Nu-Step Level 4 x 20' with one rest break, B LE exercises in sitting Gait Training:(15') using RW with R handgrip splint min/Mod-A 1 x 30', 2 x 150' with fatigue at end of walking session. Verbal cues for positioning in RW and for step-through gait with good follow through.    Therapy/Group: Individual Therapy  Natane Heward J 02/07/2013, 9:06 AM

## 2013-02-07 NOTE — Progress Notes (Signed)
Occupational Therapy Session Note  Patient Details  Name: [name] MRN: [id number] Date of Birth: 11/06/1975  Today's Date: 02/07/2013 Time: 1125-1210 Time Calculation (min): 45 min  Skilled Therapeutic Interventions/Progress Updates: Patient already in shower upon this clinician's arrival for OT ADL with her sister-in-law's help.   She completed cleansing tasks with S and safely completed lateral leans to wash buttocks/periarea (she preferred lateral leans over standing).  Patient wore dress and t-shirt over the dress.  She was able to don her socks and shoes.  For donning and doffing shoes, she kept them loosely tied so that she can don them without having to tie them again.  Overall patient required supervision for ADL today    Therapy Documentation Precautions:  Precautions Precautions: Fall Restrictions Weight Bearing Restrictions: No  Pain:denied   See FIM for current functional status  Therapy/Group: Individual Therapy  Bud Face [facility] 02/07/2013, 3:43 PM

## 2013-02-07 NOTE — Progress Notes (Signed)
Subjective/Complaints: 37 y.o. non-english speaking female with history of DM, HTN, who was admitted on 01/30/13 with right sided weakness and numbness. Last seen normal night---SBP 200 and CBG 307 at admission. MRI brain with acute nonhemorrhagic linear infarcts involving the lateral left thalamus and potentially internal capsule and MRA without significant stenosis. 2 D echo with EF 60-65% and no wall abnormalities. Carotid dopplers without significant ICA stenosis. Neurology recommends ASA 81 mg/daily for secondary stroke prevention as well as risk factor modification. Patient continues with right sided weakness with RLE instability poor weight shifting as well as decreased in coordination and blurred vision L>R.  Left hip/thigh perhaps a little better. No new problems today.  Review of Systems - Negative except right side weakness  Objective: Vital Signs: Blood pressure 130/92, pulse 83, temperature 98.2 F (36.8 C), temperature source Oral, resp. rate 18, height 4\' 11"  (1.499 m), weight 71.6 kg (157 lb 13.6 oz), last menstrual period 02/03/2013, SpO2 98.00%. No results found. Results for orders placed during the hospital encounter of 02/03/13 (from the past 72 hour(s))  GLUCOSE, CAPILLARY     Status: Abnormal   Collection Time    02/04/13 11:27 AM      Result Value Range   Glucose-Capillary 239 (*) 70 - 99 mg/dL  GLUCOSE, CAPILLARY     Status: Abnormal   Collection Time    02/04/13  4:20 PM      Result Value Range   Glucose-Capillary 223 (*) 70 - 99 mg/dL   Comment 1 Notify RN    GLUCOSE, CAPILLARY     Status: Abnormal   Collection Time    02/04/13  8:01 PM      Result Value Range   Glucose-Capillary 206 (*) 70 - 99 mg/dL  GLUCOSE, CAPILLARY     Status: Abnormal   Collection Time    02/05/13  7:40 AM      Result Value Range   Glucose-Capillary 174 (*) 70 - 99 mg/dL   Comment 1 Notify RN    GLUCOSE, CAPILLARY     Status: Abnormal   Collection Time    02/05/13 11:52 AM       Result Value Range   Glucose-Capillary 283 (*) 70 - 99 mg/dL   Comment 1 Notify RN    GLUCOSE, CAPILLARY     Status: Abnormal   Collection Time    02/05/13  4:26 PM      Result Value Range   Glucose-Capillary 218 (*) 70 - 99 mg/dL   Comment 1 Notify RN    GLUCOSE, CAPILLARY     Status: Abnormal   Collection Time    02/05/13  8:08 PM      Result Value Range   Glucose-Capillary 176 (*) 70 - 99 mg/dL   Comment 1 Notify RN    GLUCOSE, CAPILLARY     Status: Abnormal   Collection Time    02/06/13  7:18 AM      Result Value Range   Glucose-Capillary 163 (*) 70 - 99 mg/dL   Comment 1 Notify RN    GLUCOSE, CAPILLARY     Status: Abnormal   Collection Time    02/06/13 11:36 AM      Result Value Range   Glucose-Capillary 257 (*) 70 - 99 mg/dL   Comment 1 Notify RN    GLUCOSE, CAPILLARY     Status: Abnormal   Collection Time    02/06/13  4:38 PM      Result Value Range  Glucose-Capillary 140 (*) 70 - 99 mg/dL   Comment 1 Notify RN    GLUCOSE, CAPILLARY     Status: Abnormal   Collection Time    02/06/13  9:01 PM      Result Value Range   Glucose-Capillary 202 (*) 70 - 99 mg/dL  GLUCOSE, CAPILLARY     Status: Abnormal   Collection Time    02/07/13  7:23 AM      Result Value Range   Glucose-Capillary 142 (*) 70 - 99 mg/dL   Comment 1 Notify RN        Physical Exam  Constitutional: She is oriented to person, place, and time. She appears well-developed and well-nourished.  HENT:  Head: Normocephalic and atraumatic.  Eyes: Conjunctivae and EOM are normal. Pupils are equal, round, and reactive to light.  Neck: No tracheal deviation present. No thyromegaly present.  Cardiovascular: Normal rate. Regular rhythm. Right leg with mild ant tibial tenderness, no edema. Homans sign-  Respiratory: Effort normal.  Neurological: She is alert and oriented to person, place, and time. Has good insight and awareness. Answered all basic questions relatively appropriate despite language issues.  Good sitting balance. Speech generally clear.  RUE 3+ to 4 deltoid, bicep, tricep, wrist, HI, RLE 3+HF, 4-KE, 4 ADF/APF. Sensory 1 to 1+/2 right face, arm, leg.  Psychiatric:  Mood and affect  appropriate.  Musc: left thigh/hip flexors sore with touch. No swelling or warmth.    Assessment/Plan: 1. Functional deficits secondary to right hemiparesis secondary to left thalamic and left internal capsule lacunar infarcts which require 3+ hours per day of interdisciplinary therapy in a comprehensive inpatient rehab setting. Physiatrist is providing close team supervision and 24 hour management of active medical problems listed below. Physiatrist and rehab team continue to assess barriers to discharge/monitor patient progress toward functional and medical goals. FIM: FIM - Bathing Bathing Steps Patient Completed: Chest;Right Arm;Left Arm;Abdomen;Front perineal area;Right upper leg;Left upper leg;Right lower leg (including foot);Left lower leg (including foot);Buttocks Bathing: 5: Set-up assist to: Obtain items  FIM - Upper Body Dressing/Undressing Upper body dressing/undressing steps patient completed: Thread/unthread left sleeve of pullover shirt/dress;Put head through opening of pull over shirt/dress;Thread/unthread right sleeve of pullover shirt/dresss;Pull shirt over trunk Upper body dressing/undressing: 4: Steadying assist FIM - Lower Body Dressing/Undressing Lower body dressing/undressing steps patient completed: Thread/unthread right underwear leg;Thread/unthread left underwear leg;Don/Doff left sock;Don/Doff right shoe;Pull underwear up/down;Don/Doff left shoe;Don/Doff right sock Lower body dressing/undressing: 4: Steadying Assist  FIM - Toileting Toileting steps completed by patient: Adjust clothing prior to toileting;Performs perineal hygiene;Adjust clothing after toileting Toileting Assistive Devices: Grab bar or rail for support Toileting: 4: Steadying assist  FIM - Ambulance person Devices: Grab bars Toilet Transfers: 4-To toilet/BSC: Min A (steadying Pt. > 75%);3-From toilet/BSC: Mod A (lift or lower assist)  FIM - Bed/Chair Transfer Bed/Chair Transfer Assistive Devices: Arm rests Bed/Chair Transfer: 5: Supine > Sit: Supervision (verbal cues/safety issues);5: Sit > Supine: Supervision (verbal cues/safety issues);4: Bed > Chair or W/C: Min A (steadying Pt. > 75%);4: Chair or W/C > Bed: Min A (steadying Pt. > 75%)  FIM - Locomotion: Wheelchair Distance: 150 Locomotion: Wheelchair: 1: Total Assistance/staff pushes wheelchair (Pt<25%) FIM - Locomotion: Ambulation Locomotion: Ambulation Assistive Devices: Walker - Rolling;Orthosis Ambulation/Gait Assistance: 3: Mod assist Locomotion: Ambulation: 3: Travels 150 ft or more with moderate assistance (Pt: 50 - 74%)  Comprehension Comprehension Mode: Auditory Comprehension: 5-Follows basic conversation/direction: With no assist (interpretor for education patient nods head and speaks little [place] )  Expression Expression Mode: Verbal Expression: 5-Expresses basic needs/ideas: With no assist  Social Interaction Social Interaction: 5-Interacts appropriately 90% of the time - Needs monitoring or encouragement for participation or interaction.  Problem Solving Problem Solving: 5-Solves basic problems: With no assist  Memory Memory: 5-Recognizes or recalls 90% of the time/requires cueing < 10% of the time  Medical Problem List and Plan:  1. Left thalamic internal capsule infarct likely due to small vessel disease  2. DVT Prophylaxis/Anticoagulation: SCDs. Monitor for any signs of DVT  , has mild leg pain but no other signs of DVT  3. Pain Management: Tylenol as needed and improved on gabapentin for neuropathy pain, may also have thalamic pain  -left thigh appears musculoskeletal. Use tylenol/heat 4. Neuropsych: This patient is capable of making decisions on her own behalf.  5.  Hypertension. Lisinopril 5 mg daily. bp improved for the most part  6. Diabetes mellitus with peripheral neuropathy. Hemoglobin A1c 13.1.lantus insulin titrated up yesterday.   -sugars still need better control  LOS (Days) 4 A FACE TO FACE EVALUATION WAS PERFORMED  SWARTZ,ZACHARY T 02/07/2013, 8:01 AM

## 2013-02-07 NOTE — Progress Notes (Signed)
Occupational Therapy Session Note  Patient Details  Name: [name] MRN: [id number] Date of Birth: 31-Oct-1975  Today's Date: 02/07/2013 Time: 4098-1191 Time Calculation (min): 50 min  Skilled Therapeutic Interventions/Progress Updates: Patient walked with RW with R hand orthosis room to ADL apt. For functional mobility.  She required constant cues to not place walker too far out in front when she walked.   In the apt she completed home skills while incorporating her R UE and LE to increase normal function and movements.    She stood statically stabilizing on walker with min A with left hand  while vacuuming back and forth (in same spot) with R hand.   She was able to grasp and hold onto broom with Min A to complete sweeping motions to incorporate normal movement in right hand and arm.  As well, she completed reaching and crossing midline tasks in standing position to incorporate R UE normal movements (via. Reaching into cabinets, sliding cups and other items down the counter, and loading dishwasher).                  Patient c/o fatigue and elected to ride w/c back to her room where this clinician assisted her back to bed for rest with call bell and phone in place. Therapy Documentation Precautions:  Precautions Precautions: Fall Restrictions Weight Bearing Restrictions: No  Pain:denied    See FIM for current functional status  Therapy/Group: Individual Therapy  Bud Face [facility] 02/07/2013, 3:49 PM

## 2013-02-07 NOTE — Progress Notes (Signed)
Physical Therapy Session Note  Patient Details  Name: [name] MRN: [id number] Date of Birth: 03/03/1976  Today's Date: 02/07/2013 Time: 1415-1500 Time Calculation (min): 45 min  Short Term Goals: Week 1:  PT Short Term Goal 1 (Week 1): Pt will peform bed mobility on flat bed, no rail with supervision PT Short Term Goal 2 (Week 1): Pt will perform bed <> w/c transfers with min A to L and R side PT Short Term Goal 3 (Week 1): Pt will perform gait in controlled environment x 100' min A PT Short Term Goal 4 (Week 1): Pt will negotiate 3 stairs with one rail and min A   Therapy Documentation Precautions:  Precautions Precautions: Fall Restrictions Weight Bearing Restrictions: No Pain: denies pain  Gait Training:(15') using RW with right hand orthosis 2 x 80' with verbal cues for step through gait, S/Mod-I. Patient fatiguing quicker this afternoon after many therapy sessions today. Therapeutic Exercise:(30') Nu-Step, Level 2 x 20' and B LE's in sitting   Therapy/Group: Individual Therapy  Rex Kras 02/07/2013, 2:41 PM

## 2013-02-08 ENCOUNTER — Inpatient Hospital Stay (HOSPITAL_COMMUNITY): Payer: BC Managed Care – PPO | Admitting: Occupational Therapy

## 2013-02-08 ENCOUNTER — Inpatient Hospital Stay (HOSPITAL_COMMUNITY): Payer: BC Managed Care – PPO | Admitting: *Deleted

## 2013-02-08 LAB — GLUCOSE, CAPILLARY
Glucose-Capillary: 152 mg/dL — ABNORMAL HIGH (ref 70–99)
Glucose-Capillary: 159 mg/dL — ABNORMAL HIGH (ref 70–99)
Glucose-Capillary: 180 mg/dL — ABNORMAL HIGH (ref 70–99)
Glucose-Capillary: 110 mg/dL — ABNORMAL HIGH (ref 70–99)

## 2013-02-08 MED ORDER — GABAPENTIN 100 MG PO CAPS
200.0000 mg | ORAL_CAPSULE | Freq: Two times a day (BID) | ORAL | Status: DC
  Administered 2013-02-08 – 2013-02-09 (×3): 200 mg via ORAL
  Filled 2013-02-08 (×5): qty 2

## 2013-02-08 NOTE — Progress Notes (Signed)
Physical Therapy Session Note  Patient Details  Name: [name] MRN: [id number] Date of Birth: 10/30/1975  Today's Date: 02/08/2013 Time: 1027-2536 Time Calculation (min): 60 min  Skilled Therapeutic Interventions/Progress Updates:  Patient did not complain of pain at the beginning of the session, towards the end she stated she is tired and hurts all over, but was not able to specify the location or pain level. Patient was in the bathroom at the beginning of session, min A assistance to transfer to w/c from commode.  Gait training to <=>from room to gym with rolling walker with min A.  Nu Step 10 min on level 5 no rest breaks. 10 x sit to stand w/o UE support, to facilitate weight shift and motor control. Static and dynamic standing training with and w/o UE support. Patient presents with high level of anxiety when standing unsupported. Rainbow arch in standing with HHA 2 x 1 min. Kinetron 2 x 1 min in sitting and 2 x in standing.   Therapy Documentation Precautions:  Precautions Precautions: Fall Restrictions Weight Bearing Restrictions: No Pain: Pain Assessment Pain Assessment: No/denies pain Pain Score: 0-No pain Locomotion : Ambulation Ambulation/Gait Assistance: 4: Min assist   See FIM for current functional status  Therapy/Group: Individual Therapy  Dorna Mai 02/08/2013, 10:34 AM

## 2013-02-08 NOTE — Progress Notes (Signed)
Occupational Therapy Session Note  Patient Details  Name: [name] MRN: [id number] Date of Birth: [birth year]  Today's Date: 02/08/2013 Time: 1500-1600 Time Calculation (min): 60 min  Skilled Therapeutic Interventions/Progress Updates: Patient seen for weight bearing, trunk stability, R LE and UE motor control for increased independence and stability.   Tough, patient stated her right leg was "tired" with the various activities completed in the session today,   She tolerated the activities well and kept working.    Therapy Documentation Precautions:  Precautions Precautions: Fall Restrictions Weight Bearing Restrictions: No  Pain:5/10 head and RN had given pain meds previously  See FIM for current functional status  Therapy/Group: Individual Therapy  Bud Face [facility] 02/08/2013, 4:54 PM

## 2013-02-08 NOTE — Progress Notes (Signed)
Subjective/Complaints: 37 y.o. non-english speaking female with history of DM, HTN, who was admitted on 01/30/13 with right sided weakness and numbness. Last seen normal night---SBP 200 and CBG 307 at admission. MRI brain with acute nonhemorrhagic linear infarcts involving the lateral left thalamus and potentially internal capsule and MRA without significant stenosis. 2 D echo with EF 60-65% and no wall abnormalities. Carotid dopplers without significant ICA stenosis. Neurology recommends ASA 81 mg/daily for secondary stroke prevention as well as risk factor modification. Patient continues with right sided weakness with RLE instability poor weight shifting as well as decreased in coordination and blurred vision L>R.  Left hip/thigh better. RLE is painful again ("burning")  Review of Systems - Negative except right side weakness  Objective: Vital Signs: Blood pressure 141/89, pulse 86, temperature 97.8 F (36.6 C), temperature source Oral, resp. rate 20, height 4\' 11"  (1.499 m), weight 71.6 kg (157 lb 13.6 oz), last menstrual period 02/03/2013, SpO2 100.00%. No results found. Results for orders placed during the hospital encounter of 02/03/13 (from the past 72 hour(s))  GLUCOSE, CAPILLARY     Status: Abnormal   Collection Time    02/05/13 11:52 AM      Result Value Range   Glucose-Capillary 283 (*) 70 - 99 mg/dL   Comment 1 Notify RN    GLUCOSE, CAPILLARY     Status: Abnormal   Collection Time    02/05/13  4:26 PM      Result Value Range   Glucose-Capillary 218 (*) 70 - 99 mg/dL   Comment 1 Notify RN    GLUCOSE, CAPILLARY     Status: Abnormal   Collection Time    02/05/13  8:08 PM      Result Value Range   Glucose-Capillary 176 (*) 70 - 99 mg/dL   Comment 1 Notify RN    GLUCOSE, CAPILLARY     Status: Abnormal   Collection Time    02/06/13  7:18 AM      Result Value Range   Glucose-Capillary 163 (*) 70 - 99 mg/dL   Comment 1 Notify RN    GLUCOSE, CAPILLARY     Status: Abnormal   Collection Time    02/06/13 11:36 AM      Result Value Range   Glucose-Capillary 257 (*) 70 - 99 mg/dL   Comment 1 Notify RN    GLUCOSE, CAPILLARY     Status: Abnormal   Collection Time    02/06/13  4:38 PM      Result Value Range   Glucose-Capillary 140 (*) 70 - 99 mg/dL   Comment 1 Notify RN    GLUCOSE, CAPILLARY     Status: Abnormal   Collection Time    02/06/13  9:01 PM      Result Value Range   Glucose-Capillary 202 (*) 70 - 99 mg/dL  GLUCOSE, CAPILLARY     Status: Abnormal   Collection Time    02/07/13  7:23 AM      Result Value Range   Glucose-Capillary 142 (*) 70 - 99 mg/dL   Comment 1 Notify RN    GLUCOSE, CAPILLARY     Status: Abnormal   Collection Time    02/07/13 11:58 AM      Result Value Range   Glucose-Capillary 179 (*) 70 - 99 mg/dL   Comment 1 Notify RN    GLUCOSE, CAPILLARY     Status: Abnormal   Collection Time    02/07/13  4:48 PM  Result Value Range   Glucose-Capillary 148 (*) 70 - 99 mg/dL   Comment 1 Notify RN    GLUCOSE, CAPILLARY     Status: Abnormal   Collection Time    02/07/13  8:40 PM      Result Value Range   Glucose-Capillary 130 (*) 70 - 99 mg/dL   Comment 1 Documented in Chart     Comment 2 Notify RN    GLUCOSE, CAPILLARY     Status: Abnormal   Collection Time    02/08/13  7:19 AM      Result Value Range   Glucose-Capillary 152 (*) 70 - 99 mg/dL   Comment 1 Notify RN        Physical Exam  Constitutional: She is oriented to person, place, and time. She appears well-developed and well-nourished.  HENT:  Head: Normocephalic and atraumatic.  Eyes: Conjunctivae and EOM are normal. Pupils are equal, round, and reactive to light.  Neck: No tracheal deviation present. No thyromegaly present.  Cardiovascular: Normal rate. Regular rhythm. Right leg with mild ant tibial tenderness, no edema. Homans sign-  Respiratory: Effort normal.  Neurological: She is alert and oriented to person, place, and time. Has good insight and awareness.  Answered all basic questions relatively appropriate despite language issues. Good sitting balance. Speech generally clear.  RUE 3+ to 4 deltoid, bicep, tricep, wrist, HI, RLE 3+HF, 4-KE, 4 ADF/APF. Sensory 1 to 1+/2 right face, arm, leg.  Psychiatric:  Mood and affect  appropriate.  Musc: left thigh/hip flexors sore with touch. No swelling or warmth.    Assessment/Plan: 1. Functional deficits secondary to right hemiparesis secondary to left thalamic and left internal capsule lacunar infarcts which require 3+ hours per day of interdisciplinary therapy in a comprehensive inpatient rehab setting. Physiatrist is providing close team supervision and 24 hour management of active medical problems listed below. Physiatrist and rehab team continue to assess barriers to discharge/monitor patient progress toward functional and medical goals. FIM: FIM - Bathing Bathing Steps Patient Completed: Chest;Right Arm;Left Arm;Abdomen;Front perineal area;Buttocks;Right upper leg;Left upper leg;Right lower leg (including foot);Left lower leg (including foot) Bathing: 5: Set-up assist to: Open items  FIM - Upper Body Dressing/Undressing Upper body dressing/undressing steps patient completed: Thread/unthread right sleeve of pullover shirt/dresss;Pull shirt over trunk;Thread/unthread left sleeve of pullover shirt/dress;Put head through opening of pull over shirt/dress Upper body dressing/undressing: 5: Supervision: Safety issues/verbal cues FIM - Lower Body Dressing/Undressing Lower body dressing/undressing steps patient completed: Thread/unthread right pants leg;Thread/unthread left pants leg;Pull pants up/down;Fasten/unfasten pants;Don/Doff right sock;Don/Doff left sock;Don/Doff right shoe;Don/Doff left shoe (shoes already tied loosely & patient able to don/doff without untieing) Lower body dressing/undressing: 5: Set-up assist to: Obtain clothing  FIM - Toileting Toileting steps completed by patient: Adjust  clothing prior to toileting;Performs perineal hygiene;Adjust clothing after toileting Toileting Assistive Devices: Grab bar or rail for support Toileting: 0: Activity did not occur  FIM - Diplomatic Services operational officer Devices: Therapist, music Transfers: 0-Activity did not occur  FIM - Banker Devices: Arm rests Bed/Chair Transfer: 5: Supine > Sit: Supervision (verbal cues/safety issues);5: Sit > Supine: Supervision (verbal cues/safety issues);4: Bed > Chair or W/C: Min A (steadying Pt. > 75%);4: Chair or W/C > Bed: Min A (steadying Pt. > 75%)  FIM - Locomotion: Wheelchair Distance: 150 Locomotion: Wheelchair: 1: Total Assistance/staff pushes wheelchair (Pt<25%) FIM - Locomotion: Ambulation Locomotion: Ambulation Assistive Devices: Walker - Rolling;Orthosis Ambulation/Gait Assistance: 3: Mod assist Locomotion: Ambulation: 3: Travels 150 ft or  more with moderate assistance (Pt: 50 - 74%)  Comprehension Comprehension Mode: Auditory Comprehension: 5-Follows basic conversation/direction: With no assist  Expression Expression Mode: Verbal Expression: 5-Expresses basic needs/ideas: With extra time/assistive device  Social Interaction Social Interaction: 6-Interacts appropriately with others with medication or extra time (anti-anxiety, antidepressant).  Problem Solving Problem Solving: 5-Solves complex 90% of the time/cues < 10% of the time  Memory Memory: 5-Recognizes or recalls 90% of the time/requires cueing < 10% of the time  Medical Problem List and Plan:  1. Left thalamic internal capsule infarct likely due to small vessel disease  2. DVT Prophylaxis/Anticoagulation: SCDs. Monitor for any signs of DVT  , has mild leg pain but no other signs of DVT  3. Pain Management: Tylenol as needed  -increase gabapentin to 200mg  BID for neuropathic pain.  -left thigh appears musculoskeletal--improved today, continue heat/tylenol 4.  Neuropsych: This patient is capable of making decisions on her own behalf.  5. Hypertension. Lisinopril 5 mg daily. bp improved for the most part  6. Diabetes mellitus with peripheral neuropathy. Hemoglobin A1c 13.1.lantus insulin titrated up Friday with improvement.      LOS (Days) 5 A FACE TO FACE EVALUATION WAS PERFORMED  [name] T 02/08/2013, 7:45 AM

## 2013-02-09 ENCOUNTER — Inpatient Hospital Stay (HOSPITAL_COMMUNITY): Payer: BC Managed Care – PPO | Admitting: Physical Therapy

## 2013-02-09 ENCOUNTER — Inpatient Hospital Stay (HOSPITAL_COMMUNITY): Payer: BC Managed Care – PPO | Admitting: Occupational Therapy

## 2013-02-09 ENCOUNTER — Inpatient Hospital Stay (HOSPITAL_COMMUNITY): Payer: BC Managed Care – PPO

## 2013-02-09 LAB — GLUCOSE, CAPILLARY
Glucose-Capillary: 129 mg/dL — ABNORMAL HIGH (ref 70–99)
Glucose-Capillary: 142 mg/dL — ABNORMAL HIGH (ref 70–99)
Glucose-Capillary: 96 mg/dL (ref 70–99)
Glucose-Capillary: 214 mg/dL — ABNORMAL HIGH (ref 70–99)

## 2013-02-09 MED ORDER — GABAPENTIN 100 MG PO CAPS
200.0000 mg | ORAL_CAPSULE | Freq: Three times a day (TID) | ORAL | Status: DC
  Administered 2013-02-09 – 2013-02-16 (×21): 200 mg via ORAL
  Filled 2013-02-09 (×24): qty 2

## 2013-02-09 NOTE — Progress Notes (Signed)
Physical Therapy Session Note  Patient Details  Name: [name] MRN: [id number] Date of Birth: 10-Nov-1975  Today's Date: 02/09/2013 Time: 1000-1102 Time Calculation (min): 62 min  Short Term Goals: Week 1:  PT Short Term Goal 1 (Week 1): Pt will peform bed mobility on flat bed, no rail with supervision PT Short Term Goal 2 (Week 1): Pt will perform bed <> w/c transfers with min A to L and R side PT Short Term Goal 3 (Week 1): Pt will perform gait in controlled environment x 100' min A PT Short Term Goal 4 (Week 1): Pt will negotiate 3 stairs with one rail and min A  Skilled Therapeutic Interventions/Progress Updates:   Pt resting in bed; reporting improved LLE pain but now c/o pain in R tibia.  Xray completed of RLE; per radiology report, no osseous abnormalities seen on film.  Performed supine > sit and donning shoes EOB with supervision.  Performed gait x 150' x 2 reps in controlled environment with RW and min A and intermittent tactile and visual cues to maintain safe distance to RW, for heel strike on R and maintaining forward momentum for full step and stride length bilaterally.  Reviewed stair set up at home and performed stair negotiation training with R rail; performed x 3 reps beginning with RUE on R rail and pt selecting alternating sequence requiring mod-max A for safety, balance and lifting assistance when extending through RLE; during second repetition demonstrated to pt use of step to sequencing leading with LLE to ascend and RLE to descend; pt return demonstrated with RUE support on rail and continued to require mod A for weight shift and acceptance on RLE.  3rd repetition with bilat UE support on R rail and ascending and descending laterally and min-mod A.  Pt reports feeling more comfortable with lateral sequencing.  Performed NMR on mat; see below for details.  Returned to room and to w/c to rest until second OT session.    Therapy Documentation Precautions:   Precautions Precautions: Fall Restrictions Weight Bearing Restrictions: No Vital Signs: Therapy Vitals BP: 127/86 mmHg Pain: Pain Assessment Pain Assessment: 0-10 Pain Score: 3  Locomotion : Ambulation Ambulation/Gait Assistance: 4: Min assist  Other Treatments: Treatments Neuromuscular Facilitation: Right;Upper Extremity;Lower Extremity;Activity to increase coordination;Activity to increase motor control;Activity to increase timing and sequencing;Activity to increase grading;Activity to increase sustained activation;Activity to increase lateral weight shifting;Activity to increase anterior-posterior weight shifting beginning in sitting with R foot on soccer ball during rolling out, in, laterally R and L and passing to L foot for coordination and motor control training.  Transitioned to quadruped on mat with bilat UE on yoga blocks and performed stationary quadruped to focus on trunk control, WB and proprioception through trunk, RUE and RLE during alternating RUE and LUE closed chain flexion<>extension, horizontal ABD/ADD and then transitioning to forward and retro UE and LE advancement across mat with min-mod A for coordination, sequencing and balance and stabilization on R side.  Two rest breaks given but no reports of pain.  See FIM for current functional status  Therapy/Group: Individual Therapy  [facility] 02/09/2013, 11:57 AM

## 2013-02-09 NOTE — Progress Notes (Signed)
I have reviewed and agree with the attached treatment note.  Momina Hunton, OTR/L 

## 2013-02-09 NOTE — Progress Notes (Signed)
Occupational Therapy Session Note  Patient Details  Name: [name] MRN: [id number] Date of Birth: [birth year]  Today's Date: 02/09/2013 Time: 4098-1191 Time Calculation (min): 49 min  Short Term Goals: Week 1:  OT Short Term Goal 1 (Week 1): Pt will complete LB dressing with supervision. OT Short Term Goal 2 (Week 1): Pt will complete toilet transfer with supervision. OT Short Term Goal 3 (Week 1): Pt will complete tub/shower transfer with supervision. OT Short Term Goal 4 (Week 1): Pt will complete bathing with min assist. OT Short Term Goal 5 (Week 1): Pt will complete simple meal prep with min assist with use of RUE as nondominant hand.  Skilled Therapeutic Interventions/Progress Updates:    1.) Pt seen for ADL retraining with focus on tub bench transfer, bathing, and dressing. Pt in w/c upon arrival after returning from X-ray. Pt ambulated in room using RW with R hand orthosis to retrieve clothes with Min A. Pt was wheeled by therapist to apartment to bathe. Pt ambulated into bathroom using RW and transferred to the tub bench with supervision and verbal cues to bring legs over into tub. Pt able to bathe using lateral leans to complete perineal and buttocks areas. Pt able to don dress,socks, and shoes with supervision. Pt required steadying assist to stand when pulling up underwear. In gym Pt completed [facility] activities with RUE such as lacing small bead and placing pegs into small hole. Pt left in room in bed with all needs in reach.   2.) Pt seen 1:1 for OT with focus on gross motor control and FMC. Pt in w/c upon arrival. In gym transfer w/c > mat Min A. Pt stood with assistance from RW and used RUE to reach in all planes for rings.Pt expressed fear  Of falling when standing while completing the reaching activity and verbalized her leg was tired. Pt completed [facility] activities with Right hand manipulating pennies and performing exercises using soft resistive theraputty while sitting. Therapist  provided Pt with handout of exercises to do with theraputty. Pt demonstrated all exercises and verbalized understanding. Pt left in room in w/c with all needs in reach.  Therapy Documentation Precautions:  Precautions Precautions: Fall Restrictions Weight Bearing Restrictions: No General: General Amount of Missed OT Time (min): 11 Minutes Vital Signs: Therapy Vitals BP: 127/86 mmHg Pain: Pain Assessment Pain Assessment: 0-10 Pain Score: 3  A See FIM for current functional status  Therapy/Group: Individual Therapy  Donna Christen 02/09/2013, 9:39 AM

## 2013-02-09 NOTE — Progress Notes (Signed)
Subjective/Complaints: 37 y.o. non-english speaking female with history of DM, HTN, who was admitted on 01/30/13 with right sided weakness and numbness. Last seen normal night---SBP 200 and CBG 307 at admission. MRI brain with acute nonhemorrhagic linear infarcts involving the lateral left thalamus and potentially internal capsule and MRA without significant stenosis. 2 D echo with EF 60-65% and no wall abnormalities. Carotid dopplers without significant ICA stenosis. Neurology recommends ASA 81 mg/daily for secondary stroke prevention as well as risk factor modification. Patient continues with right sided weakness with RLE instability poor weight shifting as well as decreased in coordination and blurred vision L>R.  RUE and  RLE numbness, RLE burning pain  Review of Systems - Negative except right side weakness  Objective: Vital Signs: Blood pressure 127/86, pulse 89, temperature 97.9 F (36.6 C), temperature source Oral, resp. rate 20, height 4\' 11"  (1.499 m), weight 71.6 kg (157 lb 13.6 oz), last menstrual period 02/03/2013, SpO2 100.00%. No results found. Results for orders placed during the hospital encounter of 02/03/13 (from the past 72 hour(s))  GLUCOSE, CAPILLARY     Status: Abnormal   Collection Time    02/06/13 11:36 AM      Result Value Range   Glucose-Capillary 257 (*) 70 - 99 mg/dL   Comment 1 Notify RN    GLUCOSE, CAPILLARY     Status: Abnormal   Collection Time    02/06/13  4:38 PM      Result Value Range   Glucose-Capillary 140 (*) 70 - 99 mg/dL   Comment 1 Notify RN    GLUCOSE, CAPILLARY     Status: Abnormal   Collection Time    02/06/13  9:01 PM      Result Value Range   Glucose-Capillary 202 (*) 70 - 99 mg/dL  GLUCOSE, CAPILLARY     Status: Abnormal   Collection Time    02/07/13  7:23 AM      Result Value Range   Glucose-Capillary 142 (*) 70 - 99 mg/dL   Comment 1 Notify RN    GLUCOSE, CAPILLARY     Status: Abnormal   Collection Time    02/07/13 11:58 AM    Result Value Range   Glucose-Capillary 179 (*) 70 - 99 mg/dL   Comment 1 Notify RN    GLUCOSE, CAPILLARY     Status: Abnormal   Collection Time    02/07/13  4:48 PM      Result Value Range   Glucose-Capillary 148 (*) 70 - 99 mg/dL   Comment 1 Notify RN    GLUCOSE, CAPILLARY     Status: Abnormal   Collection Time    02/07/13  8:40 PM      Result Value Range   Glucose-Capillary 130 (*) 70 - 99 mg/dL   Comment 1 Documented in Chart     Comment 2 Notify RN    GLUCOSE, CAPILLARY     Status: Abnormal   Collection Time    02/08/13  7:19 AM      Result Value Range   Glucose-Capillary 152 (*) 70 - 99 mg/dL   Comment 1 Notify RN    GLUCOSE, CAPILLARY     Status: Abnormal   Collection Time    02/08/13 11:35 AM      Result Value Range   Glucose-Capillary 159 (*) 70 - 99 mg/dL   Comment 1 Notify RN    GLUCOSE, CAPILLARY     Status: Abnormal   Collection Time    02/08/13  4:23 PM      Result Value Range   Glucose-Capillary 180 (*) 70 - 99 mg/dL  GLUCOSE, CAPILLARY     Status: Abnormal   Collection Time    02/08/13  8:55 PM      Result Value Range   Glucose-Capillary 110 (*) 70 - 99 mg/dL  GLUCOSE, CAPILLARY     Status: Abnormal   Collection Time    02/09/13  7:18 AM      Result Value Range   Glucose-Capillary 129 (*) 70 - 99 mg/dL   Comment 1 Notify RN        Physical Exam  Constitutional: She is oriented to person, place, and time. She appears well-developed and well-nourished.  HENT:  Head: Normocephalic and atraumatic.  Eyes: Conjunctivae and EOM are normal. Pupils are equal, round, and reactive to light.  Neck: No tracheal deviation present. No thyromegaly present.  Cardiovascular: Normal rate. Regular rhythm. Right leg with mild ant tibial tenderness, no edema. Homans sign-  Respiratory: Effort normal.  Neurological: She is alert and oriented to person, place, and time. Has good insight and awareness. Answered all basic questions relatively appropriate despite language  issues. Good sitting balance. Speech generally clear.  RUE 3+ to 4 deltoid, bicep, tricep, wrist, HI, RLE 3+HF, 4-KE, 4 ADF/APF. Sensory 1 to 1+/2 right face, arm, leg.  Psychiatric:  Mood and affect  appropriate.  Musc: left thigh/hip flexors sore with touch. No swelling or warmth.    Assessment/Plan: 1. Functional deficits secondary to right hemiparesis secondary to left thalamic and left internal capsule lacunar infarcts which require 3+ hours per day of interdisciplinary therapy in a comprehensive inpatient rehab setting. Physiatrist is providing close team supervision and 24 hour management of active medical problems listed below. Physiatrist and rehab team continue to assess barriers to discharge/monitor patient progress toward functional and medical goals. FIM: FIM - Bathing Bathing Steps Patient Completed: Chest;Right Arm;Left Arm;Abdomen;Front perineal area;Buttocks;Right upper leg;Left upper leg;Right lower leg (including foot);Left lower leg (including foot) Bathing: 5: Set-up assist to: Open items  FIM - Upper Body Dressing/Undressing Upper body dressing/undressing steps patient completed: Thread/unthread right sleeve of pullover shirt/dresss;Pull shirt over trunk;Thread/unthread left sleeve of pullover shirt/dress;Put head through opening of pull over shirt/dress Upper body dressing/undressing: 5: Supervision: Safety issues/verbal cues FIM - Lower Body Dressing/Undressing Lower body dressing/undressing steps patient completed: Thread/unthread right pants leg;Thread/unthread left pants leg;Pull pants up/down;Fasten/unfasten pants;Don/Doff right sock;Don/Doff left sock;Don/Doff right shoe;Don/Doff left shoe (shoes already tied loosely & patient able to don/doff without untieing) Lower body dressing/undressing: 5: Set-up assist to: Obtain clothing  FIM - Toileting Toileting steps completed by patient: Adjust clothing prior to toileting;Performs perineal hygiene;Adjust clothing after  toileting Toileting Assistive Devices: Grab bar or rail for support Toileting: 0: Activity did not occur  FIM - Diplomatic Services operational officer Devices: Therapist, music Transfers: 0-Activity did not occur  FIM - Banker Devices: Arm rests Bed/Chair Transfer: 5: Supine > Sit: Supervision (verbal cues/safety issues);5: Chair or W/C > Bed: Supervision (verbal cues/safety issues);5: Bed > Chair or W/C: Supervision (verbal cues/safety issues)  FIM - Locomotion: Wheelchair Distance: 150 Locomotion: Wheelchair: 1: Total Assistance/staff pushes wheelchair (Pt<25%) FIM - Locomotion: Ambulation Locomotion: Ambulation Assistive Devices: Designer, industrial/product Ambulation/Gait Assistance: 4: Min assist Locomotion: Ambulation: 4: Travels 150 ft or more with minimal assistance (Pt.>75%)  Comprehension Comprehension Mode: Auditory Comprehension: 5-Follows basic conversation/direction: With no assist  Expression Expression Mode: Verbal Expression: 5-Expresses complex 90% of the time/cues <  10% of the time  Social Interaction Social Interaction: 6-Interacts appropriately with others with medication or extra time (anti-anxiety, antidepressant).  Problem Solving Problem Solving: 5-Solves complex 90% of the time/cues < 10% of the time  Memory Memory: 5-Recognizes or recalls 90% of the time/requires cueing < 10% of the time  Medical Problem List and Plan:  1. Left thalamic internal capsule infarct likely due to small vessel disease  2. DVT Prophylaxis/Anticoagulation: SCDs. Monitor for any signs of DVT  , has mild leg pain but no other signs of DVT  3. Pain Management: Tylenol as needed  -increase gabapentin to 200mg  BID for neuropathic pain, thalamic pain syndrome +/- diabetic neuropathy  -left thigh appears musculoskeletal--improved today, continue heat/tylenol 4. Neuropsych: This patient is capable of making decisions on her own behalf.  5.  Hypertension. Lisinopril 5 mg daily. bp improved for the most part  6. Diabetes mellitus with peripheral neuropathy. Hemoglobin A1c 13.1.lantus insulin titrated up Friday with improvement.      LOS (Days) 6 A FACE TO FACE EVALUATION WAS PERFORMED  KIRSTEINS,ANDREW E 02/09/2013, 8:22 AM

## 2013-02-09 NOTE — Progress Notes (Signed)
Physical Therapy Note  Patient Details  Name: [name] MRN: [id number] Date of Birth: [birth year] Today's Date: 02/09/2013  1330-1410 (40 minutes) individual Pain: no pain reported Focus of treatment: Therapeutic activity focused on RT LE strengthening/ standing balance Treatment: Kinetron in sitting x 20 reps; Kinetron in standing 3 X 20 reps with bilateral UE support focusing on RT knee extension control in stance; stepping up/down 4 inch step with RW support alternating LEs.    Egan Berkheimer,JIM 02/09/2013, 2:14 PM

## 2013-02-10 ENCOUNTER — Inpatient Hospital Stay (HOSPITAL_COMMUNITY): Payer: BC Managed Care – PPO | Admitting: Occupational Therapy

## 2013-02-10 ENCOUNTER — Inpatient Hospital Stay (HOSPITAL_COMMUNITY): Payer: BC Managed Care – PPO | Admitting: *Deleted

## 2013-02-10 DIAGNOSIS — I69998 Other sequelae following unspecified cerebrovascular disease: Secondary | ICD-10-CM

## 2013-02-10 DIAGNOSIS — R209 Unspecified disturbances of skin sensation: Secondary | ICD-10-CM

## 2013-02-10 DIAGNOSIS — I6330 Cerebral infarction due to thrombosis of unspecified cerebral artery: Secondary | ICD-10-CM

## 2013-02-10 LAB — URINALYSIS, ROUTINE W REFLEX MICROSCOPIC
Bilirubin Urine: NEGATIVE
Glucose, UA: 250 mg/dL — AB
Hgb urine dipstick: NEGATIVE
Ketones, ur: NEGATIVE mg/dL
Leukocytes, UA: NEGATIVE
Nitrite: NEGATIVE
Protein, ur: NEGATIVE mg/dL
Specific Gravity, Urine: 1.016 (ref 1.005–1.030)
Urobilinogen, UA: 0.2 mg/dL (ref 0.0–1.0)
pH: 5.5 (ref 5.0–8.0)

## 2013-02-10 LAB — GLUCOSE, CAPILLARY
Glucose-Capillary: 133 mg/dL — ABNORMAL HIGH (ref 70–99)
Glucose-Capillary: 181 mg/dL — ABNORMAL HIGH (ref 70–99)
Glucose-Capillary: 179 mg/dL — ABNORMAL HIGH (ref 70–99)
Glucose-Capillary: 154 mg/dL — ABNORMAL HIGH (ref 70–99)

## 2013-02-10 NOTE — Progress Notes (Signed)
I have reviewed and agree with the treatment as reflected in this note. Margues Filippini, PT DPT  

## 2013-02-10 NOTE — Progress Notes (Addendum)
Occupational Therapy Session Note  Patient Details  Name: [name] MRN: [id number] Date of Birth: [birth year]  Today's Date: 02/10/2013 Time: 4098-1191 , 4782-9562, and 1308-6578 Time Calculation (min): 56 min, 30 min, and 43 min  Short Term Goals: Week 1:  OT Short Term Goal 1 (Week 1): Pt will complete LB dressing with supervision. OT Short Term Goal 2 (Week 1): Pt will complete toilet transfer with supervision. OT Short Term Goal 3 (Week 1): Pt will complete tub/shower transfer with supervision. OT Short Term Goal 4 (Week 1): Pt will complete bathing with min assist. OT Short Term Goal 5 (Week 1): Pt will complete simple meal prep with min assist with use of RUE as nondominant hand.  Skilled Therapeutic Interventions/Progress Updates:    1.) Pt seen for ADL retraining with focus on bathing, dressing, and grooming. Pt in bed upon arrival.  Pt ambulated using RW to complete oral care at sink and to retrieve clothing requiring close supervision. Therapist wheeled Pt to ADL apartment to complete bathing and dressing. Pt ambulated using RW into bathroom and completed bathing in tub shower. Pt able to don clothes with supervision and assistance to stand using RW to pull up underwear. Pt ambulated using RW to sink to comb hair. Pt ambulated from bathroom to love seat. Pt educated on reaching back for armrests when sitting and pushing off from armrests when standing, Pt demonstrated carryover. Therapist wheeled Pt back to room. Pt ambulated using RW to sink to dry hair. Pt's sister was present at end of session. Therapist educated sister on theraputty exercises, using the RW for safety with mobility, and follow up therapy when Pt discharges home.   2.) Pt seen 1:1 OT with focus on standing balance and FMC tasks using RUE. Pt in bed upon arrival. In gym Pt performed [facility] tasks while standing, with block underneath left LE to promote weight bearing through RLE. Pt able to stand for approx 4 min before Pt  reported her RLE was tired. Pt sat in w/c to complete [facility] tasks such as picking toothpicks up with tweezers and writing name. Increased focus on functional [facility] tasks with zipping/unzipping purse, removing credit cards from wallet, and retrieving money. Pt reported her arm was tired after activities. Pt had good control using tweezers but had ataxic movements when retrieving credit cards from wallet and zipping/unzipping purse. Will continue to address ataxic movements and FMC. Pt transferred from w/c > bed Min A. Pt left with all needs in reach.   3.) Pt seen 1:1 OT with focus on RUE weight bearing, transfers sit > stand level, and gross motor tasks in sitting and standing. Pt in room upon arrival. In gym Pt transitioned into quadruped position on mat with verbal cues and Min A for sequencing of movement. Pt performed reaching activity, crossing midline, while bearing weight through RUE followed by reaching with RUE in tall kneeling. Pt performed exercises using weighted therapy ball (1lb and 2 lbs) such as PNF pattern reaching and trunk rotation while seated edge of mat. Pt threw and caught ball to/from therapist first with weighted 1lb ball and then soccer ball, progressing to standing to catch/throw ball. Pt required Min A to stand and throw then progressed to close supervision. Therapist educated Pt on standing without assistance. Pt able to sit > stand x2 with supervision and increased time due to fearfulness of falling. Pt required rest breaks throughout session due to decreased endurance and reports of RUE fatigue. Pt transferred back to bed  with all needs in reach.  Therapy Documentation Precautions:  Precautions Precautions: Fall Restrictions Weight Bearing Restrictions: No General:   Vital Signs: Therapy Vitals Temp: 97.3 F (36.3 C) Temp src: Oral Pulse Rate: 92 Resp: 16 BP: 120/82 mmHg Patient Position, if appropriate: Lying Oxygen Therapy SpO2: 98 % O2 Device: None (Room  air) Pain: Pain Assessment Pain Assessment: 0-10 Pain Score: 5  Pain Type: Acute pain Pain Location: Head Pain Descriptors / Indicators: Aching  See FIM for current functional status  Therapy/Group: Individual Therapy  Donna Christen 02/10/2013, 9:33 AM

## 2013-02-10 NOTE — Progress Notes (Signed)
Physical Therapy Session Note  Patient Details  Name: [name] MRN: [id number] Date of Birth: 14-May-1975  Today's Date: 02/10/2013 Time: 0930-1030 Time Calculation (min): 60 min  Short Term Goals: Week 1:  PT Short Term Goal 1 (Week 1): Pt will peform bed mobility on flat bed, no rail with supervision PT Short Term Goal 2 (Week 1): Pt will perform bed <> w/c transfers with min A to L and R side PT Short Term Goal 3 (Week 1): Pt will perform gait in controlled environment x 100' min A PT Short Term Goal 4 (Week 1): Pt will negotiate 3 stairs with one rail and min A  Skilled Therapeutic Interventions/Progress Updates:    Pt received seated in W/C in room. Pt transferred sit>stand with min guard and RUE support on RW. Pt ambulated 150 feet min guard with RW with intermittent VC for heel strike on R. Pt transferred stand>sit to mat with min guard. Pt complained of R leg pain after walking so performed PROM into ankle dorsiflexion and provided passive calf and hamstring stretch in sitting. Pt reported relief of pain after stretching.  Pt performed seated coordination exercise knee flexion/extension and ankle inversion/eversion with foot on soccer ball. NMR in quadruped for trunk control including anterior/posterior and lateral weight shifting. Manual facilitation given for weight shift and mod assist for maintenance of trunk positioning. Pt complained of R arm pain after quadruped positioning so rested in sitting and instructed pt in wrist ROM to decrease pain. Pt then negotiated 5 steps utilizing step-to pattern with R rail and mod assist for trunk support and balance. Pt required VC and TC for appropriate sequencing for ascending and descending stairs. Pt required seated rest break after stair negotiation. Pt ambulated with RW to high table and performed standing card sorting by color with RUE with LUE support on table. Placed 4 inch step under L foot to facilitate weight bearing on RLE. Patient  completed card sorting in standing 51 cards x2. Pt demonstrated difficulty with coordination and accuracy of RUE but was able to retrieve and grip cards from deck without assistance. Pt RUE coordination improved throughout activity. Pt ambulated 10 ft with mod assist HHA for balance and weight shifting onto R. Pt returned to room in W/C. Pt transferred W/C>bed with min guard and sit>supine with supervision. Pt left supine in bed with all needs in reach.   Therapy Documentation Precautions:  Precautions Precautions: Fall Restrictions Weight Bearing Restrictions: No  Pain: Pain Type: Acute pain Pain Location: Right calf Performed passive R ankle dorsiflexion ROM and calf stretch to relieve pain.   Mobility: Bed Mobility Bed Mobility: Sit to Supine Sit to Supine: 5: Supervision Transfers Transfers: Yes Sit to Stand: 4: Min guard Stand to Sit: 4: Min guard Stand to Sit Details (indicate cue type and reason): Verbal cues for technique;Verbal cues for precautions/safety Stand Pivot Transfers: 4: Min guard Stand Pivot Transfer Details: Tactile cues for weight shifting Locomotion : Ambulation Ambulation: Yes Ambulation/Gait Assistance: 4: Min guard Ambulation Distance (Feet): 150 Feet Assistive device: Rolling walker Ambulation/Gait Assistance Details: Verbal cues for gait pattern;Verbal cues for technique;Tactile cues for weight shifting Stairs / Management consultant Assistance: 3: Mod assist Stairs Assistance Details: Verbal cues for gait pattern;Verbal cues for sequencing;Manual facilitation for weight shifting;Tactile cues for sequencing Stair Management Technique: One rail Right;Step to pattern;Forwards Number of Stairs: 5   See FIM for current functional status  Therapy/Group: Individual Therapy  [name] 02/10/2013, 12:20 PM

## 2013-02-10 NOTE — Progress Notes (Signed)
Subjective/Complaints: 37 y.o. non-english speaking female with history of DM, HTN, who was admitted on 01/30/13 with right sided weakness and numbness. Last seen normal night---SBP 200 and CBG 307 at admission. MRI brain with acute nonhemorrhagic linear infarcts involving the lateral left thalamus and potentially internal capsule and MRA without significant stenosis. 2 D echo with EF 60-65% and no wall abnormalities. Carotid dopplers without significant ICA stenosis. Neurology recommends ASA 81 mg/daily for secondary stroke prevention as well as risk factor modification. Patient continues with right sided weakness with RLE instability poor weight shifting as well as decreased in coordination and blurred vision L>R.  RUE and  RLE numbness, RLE burning pain  Review of Systems - Negative except right side weakness  Objective: Vital Signs: Blood pressure 101/71, pulse 89, temperature 98.3 F (36.8 C), temperature source Oral, resp. rate 18, height 4\' 11"  (1.499 m), weight 71.6 kg (157 lb 13.6 oz), last menstrual period 02/03/2013, SpO2 97.00%. Dg Tibia/fibula Right  02/09/2013   CLINICAL DATA:  Anterior pain right tibia and fibula, no trauma, history hypertension, diabetes  EXAM: RIGHT TIBIA AND FIBULA - 2 VIEW  COMPARISON:  None  FINDINGS: Osseous mineralization normal.  Joint spaces preserved.  No fracture, dislocation, or bone destruction.  Tiny calcified phlebolith pretibial at the mid lower leg.  IMPRESSION: No acute osseous abnormalities.   Electronically Signed   By: Ulyses Southward M.D.   On: 02/09/2013 08:51   Results for orders placed during the hospital encounter of 02/03/13 (from the past 72 hour(s))  GLUCOSE, CAPILLARY     Status: Abnormal   Collection Time    02/07/13 11:58 AM      Result Value Range   Glucose-Capillary 179 (*) 70 - 99 mg/dL   Comment 1 Notify RN    GLUCOSE, CAPILLARY     Status: Abnormal   Collection Time    02/07/13  4:48 PM      Result Value Range   Glucose-Capillary 148 (*) 70 - 99 mg/dL   Comment 1 Notify RN    GLUCOSE, CAPILLARY     Status: Abnormal   Collection Time    02/07/13  8:40 PM      Result Value Range   Glucose-Capillary 130 (*) 70 - 99 mg/dL   Comment 1 Documented in Chart     Comment 2 Notify RN    GLUCOSE, CAPILLARY     Status: Abnormal   Collection Time    02/08/13  7:19 AM      Result Value Range   Glucose-Capillary 152 (*) 70 - 99 mg/dL   Comment 1 Notify RN    GLUCOSE, CAPILLARY     Status: Abnormal   Collection Time    02/08/13 11:35 AM      Result Value Range   Glucose-Capillary 159 (*) 70 - 99 mg/dL   Comment 1 Notify RN    GLUCOSE, CAPILLARY     Status: Abnormal   Collection Time    02/08/13  4:23 PM      Result Value Range   Glucose-Capillary 180 (*) 70 - 99 mg/dL  GLUCOSE, CAPILLARY     Status: Abnormal   Collection Time    02/08/13  8:55 PM      Result Value Range   Glucose-Capillary 110 (*) 70 - 99 mg/dL  GLUCOSE, CAPILLARY     Status: Abnormal   Collection Time    02/09/13  7:18 AM      Result Value Range   Glucose-Capillary 129 (*)  70 - 99 mg/dL   Comment 1 Notify RN    GLUCOSE, CAPILLARY     Status: Abnormal   Collection Time    02/09/13 11:50 AM      Result Value Range   Glucose-Capillary 142 (*) 70 - 99 mg/dL  GLUCOSE, CAPILLARY     Status: None   Collection Time    02/09/13  4:44 PM      Result Value Range   Glucose-Capillary 96  70 - 99 mg/dL   Comment 1 Notify RN    GLUCOSE, CAPILLARY     Status: Abnormal   Collection Time    02/09/13  8:43 PM      Result Value Range   Glucose-Capillary 214 (*) 70 - 99 mg/dL   Comment 1 Notify RN        Physical Exam  Constitutional: She is oriented to person, place, and time. She appears well-developed and well-nourished.  HENT:  Head: Normocephalic and atraumatic.  Eyes: Conjunctivae and EOM are normal. Pupils are equal, round, and reactive to light.  Neck: No tracheal deviation present. No thyromegaly present.  Cardiovascular:  Normal rate. Regular rhythm. Right leg with mild ant tibial tenderness, no edema. Homans sign-  Respiratory: Effort normal.  Neurological: She is alert and oriented to person, place, and time. Has good insight and awareness. Answered all basic questions relatively appropriate despite language issues. Good sitting balance. Speech generally clear.  RUE 3+ to 4 deltoid, bicep, tricep, wrist, HI, RLE 3+HF, 4-KE, 4 ADF/APF. Sensory 1 to 1+/2 right face, arm, leg.  Psychiatric:  Mood and affect  appropriate.  Musc: left thigh/hip flexors sore with touch. No swelling or warmth.    Assessment/Plan: 1. Functional deficits secondary to right hemiparesis secondary to left thalamic and left internal capsule lacunar infarcts which require 3+ hours per day of interdisciplinary therapy in a comprehensive inpatient rehab setting. Physiatrist is providing close team supervision and 24 hour management of active medical problems listed below. Physiatrist and rehab team continue to assess barriers to discharge/monitor patient progress toward functional and medical goals. FIM: FIM - Bathing Bathing Steps Patient Completed: Chest;Right Arm;Left Arm;Abdomen;Front perineal area;Buttocks;Right upper leg;Left upper leg;Right lower leg (including foot);Left lower leg (including foot) Bathing: 5: Set-up assist to: Obtain items  FIM - Upper Body Dressing/Undressing Upper body dressing/undressing steps patient completed: Thread/unthread right sleeve of pullover shirt/dresss;Thread/unthread left sleeve of pullover shirt/dress;Put head through opening of pull over shirt/dress;Pull shirt over trunk Upper body dressing/undressing: 5: Set-up assist to: Obtain clothing/put away FIM - Lower Body Dressing/Undressing Lower body dressing/undressing steps patient completed: Pull underwear up/down;Thread/unthread right underwear leg;Thread/unthread left underwear leg;Don/Doff right sock;Don/Doff left sock;Don/Doff right shoe;Don/Doff  left shoe Lower body dressing/undressing: 4: Steadying Assist  FIM - Toileting Toileting steps completed by patient: Adjust clothing prior to toileting;Performs perineal hygiene;Adjust clothing after toileting Toileting Assistive Devices: Grab bar or rail for support Toileting: 6: More than reasonable amount of time  FIM - Diplomatic Services operational officer Devices: Therapist, music Transfers: 0-Activity did not occur  FIM - Banker Devices: Arm rests Bed/Chair Transfer: 5: Supine > Sit: Supervision (verbal cues/safety issues);5: Sit > Supine: Supervision (verbal cues/safety issues);4: Bed > Chair or W/C: Min A (steadying Pt. > 75%);4: Chair or W/C > Bed: Min A (steadying Pt. > 75%)  FIM - Locomotion: Wheelchair Distance: 150 Locomotion: Wheelchair: 0: Activity did not occur FIM - Locomotion: Ambulation Locomotion: Ambulation Assistive Devices: Designer, industrial/product Ambulation/Gait Assistance: 4: Min assist  Locomotion: Ambulation: 4: Travels 150 ft or more with minimal assistance (Pt.>75%)  Comprehension Comprehension Mode: Auditory Comprehension: 5-Follows basic conversation/direction: With no assist  Expression Expression Mode: Verbal Expression: 5-Expresses basic 90% of the time/requires cueing < 10% of the time.  Social Interaction Social Interaction: 6-Interacts appropriately with others with medication or extra time (anti-anxiety, antidepressant).  Problem Solving Problem Solving: 5-Solves complex 90% of the time/cues < 10% of the time  Memory Memory: 5-Recognizes or recalls 90% of the time/requires cueing < 10% of the time  Medical Problem List and Plan:  1. Left thalamic internal capsule infarct likely due to small vessel disease  2. DVT Prophylaxis/Anticoagulation: SCDs. Monitor for any signs of DVT  , has mild leg pain but no other signs of DVT  3. Pain Management: Tylenol as needed  -increase gabapentin to 200mg  BID  for neuropathic pain, thalamic pain syndrome +/- diabetic neuropathy  -left thigh appears musculoskeletal--improved today, continue heat/tylenol 4. Neuropsych: This patient is capable of making decisions on her own behalf.  5. Hypertension. Lisinopril 5 mg daily. bp improved for the most part  6. Diabetes mellitus with peripheral neuropathy. Hemoglobin A1c 13.1.lantus insulin titrated up  with improvement.      LOS (Days) 7 A FACE TO FACE EVALUATION WAS PERFORMED  KIRSTEINS,ANDREW E 02/10/2013, 7:38 AM

## 2013-02-11 ENCOUNTER — Inpatient Hospital Stay (HOSPITAL_COMMUNITY): Payer: BC Managed Care – PPO | Admitting: Occupational Therapy

## 2013-02-11 ENCOUNTER — Inpatient Hospital Stay (HOSPITAL_COMMUNITY): Payer: BC Managed Care – PPO | Admitting: Physical Therapy

## 2013-02-11 LAB — GLUCOSE, CAPILLARY
Glucose-Capillary: 130 mg/dL — ABNORMAL HIGH (ref 70–99)
Glucose-Capillary: 118 mg/dL — ABNORMAL HIGH (ref 70–99)
Glucose-Capillary: 104 mg/dL — ABNORMAL HIGH (ref 70–99)
Glucose-Capillary: 87 mg/dL (ref 70–99)

## 2013-02-11 NOTE — Progress Notes (Signed)
Physical Therapy Weekly Progress Note  Patient Details  Name: [name] MRN: [id number] Date of Birth: Nov 16, 1975  Today's Date: 02/11/2013  Patient has made excellent progress and has met 3 of 4 short term goals.  Pt is currently supervision for bed mobility and min A overall for basic transfers, gait in controlled environment with bilat UE support on RW but continues to require mod A for stair negotiation and requires increased physical assistance when bilat UE not supported secondary to truncal ataxia.  Pt R grip strength has improved and pt not longer requires use of hand orthosis on RW.  Patient continues to demonstrate the following deficits: R hemiplegia with impaired motor control, timing/sequencing, coordination, impaired sensation and proprioception, impaired postural control, balance, gait and therefore will continue to benefit from skilled PT intervention to enhance overall performance with balance, postural control, ability to compensate for deficits, functional use of  right upper extremity and right lower extremity and coordination.  Patient progressing toward long term goals..  Plan of care revisions: downgraded stairs to min A for negotiation.  PT Short Term Goals Week 1:  PT Short Term Goal 1 (Week 1): Pt will peform bed mobility on flat bed, no rail with supervision PT Short Term Goal 1 - Progress (Week 1): Met PT Short Term Goal 2 (Week 1): Pt will perform bed <> w/c transfers with min A to L and R side PT Short Term Goal 2 - Progress (Week 1): Met PT Short Term Goal 3 (Week 1): Pt will perform gait in controlled environment x 100' min A PT Short Term Goal 3 - Progress (Week 1): Met PT Short Term Goal 4 (Week 1): Pt will negotiate 3 stairs with one rail and min A PT Short Term Goal 4 - Progress (Week 1): Progressing toward goal Week 2:  PT Short Term Goal 1 (Week 2): = LTG  Therapy Documentation Precautions:  Precautions Precautions: Fall Restrictions Weight Bearing  Restrictions: No Pain: Pain Assessment Pain Assessment: No/denies pain Pain Score: 0-No pain  See FIM for current functional status  [name] 02/11/2013, 11:54 AM

## 2013-02-11 NOTE — Progress Notes (Signed)
Physical Therapy Note  Patient Details  Name: Kaliegh Willadsen MRN: [id number] Date of Birth: Dec 31, 1975 Today's Date: 02/11/2013  1000-1055 (55 minutes) individual Pain: no reported pain Focus of treatment: gait training Treatment: Pt in bed upon arrival; gait 120 room to gym with RW  With vcs for step through vs step to gait pattern on left LE with foot flat on right and minimal ankle inversion during heel strike; gait using Lite gait + treadmill to facilitate heel/toe gait pattern on right; pt able to gait on treadmill @ 0.3 MPH X 3 for approximately 5 minutes session with tactile cues RT foot to facilitate heel strike. Pt displayed improved heel strike with practice. Gait to room 120 feet RW  min assist at AD to facilitate continued step through on left (step through)    Lahoma Constantin,JIM 02/11/2013, 12:40 PM

## 2013-02-11 NOTE — Progress Notes (Signed)
Occupational Therapy Weekly Progress Note  Patient Details  Name: Hendel Gatliff MRN: [id number] Date of Birth: 04-Aug-1975  Today's Date: 02/11/2013 Time: [phone] and [phone] Time Calculation (min): 58 min and 44 min  Patient has met 3 of 5 short term goals. Pt continues to make good progress towards goals.  She continues to require steady assist when pulling up underwear due to increased fear of falling and decreased balance reactions. Pt demonstrates ataxic movements when reaching during gross motor tasks, however able to complete grasp necessary for self-care tasks.  Patient continues to demonstrate the following deficits: Rt hemiplegia with impaired motor control, timing/sequencing, impaired gross and fine motor coordination, impaired sensation and proprioception, impaired postural control, balance and therefore will continue to benefit from skilled OT intervention to enhance overall performance with BADL, iADL and Reduce care partner burden.  Patient progressing toward long term goals..  Continue plan of care.  OT Short Term Goals Week 1:  OT Short Term Goal 1 (Week 1): Pt will complete LB dressing with supervision. OT Short Term Goal 1 - Progress (Week 1): Progressing toward goal OT Short Term Goal 2 (Week 1): Pt will complete toilet transfer with supervision. OT Short Term Goal 2 - Progress (Week 1): Met OT Short Term Goal 3 (Week 1): Pt will complete tub/shower transfer with supervision. OT Short Term Goal 3 - Progress (Week 1): Met OT Short Term Goal 4 (Week 1): Pt will complete bathing with min assist. OT Short Term Goal 4 - Progress (Week 1): Met OT Short Term Goal 5 (Week 1): Pt will complete simple meal prep with min assist with use of RUE as nondominant hand. OT Short Term Goal 5 - Progress (Week 1): Not met Week 2:  OT Short Term Goal 1 (Week 2): Pt will complete LB dressing with supervision. OT Short Term Goal 2 (Week 2): Pt will complete simple meal prep with supervision  with use of RUE as nondominant hand. OT Short Term Goal 3 (Week 2): STG=LTG Mod I overall for self care  Skilled Therapeutic Interventions/Progress Updates:    1.) Pt seen for ADL retraining with focus on bathing, dressing, gross motor control and [facility] tasks. Pt in bed upon arrival. Pt ambulated using RW to walk in shower to complete bathing. Ambulated with RW to shower chair with supervision. Pt able to don dress but required steadying assist to pull up underwear. While sitting on EOB patient demonstrated donning button up shirt to address FMC, Pt completed with no difficulty. Pt ambulated to day room to complete gross motor and [facility] task at table. Pt required rest break after ambulating approx. 25 feet. Pt completed gross motor and FMC tasks in standing for 2 min before complaining of leg pain, completed task at seated position. Pt completed tasks such as crossing midline, reaching having >90 degree flexion, and stacking jenga blocks. Ataxic movements noted more when Pt had > 90 degree flexion and full elbow extension. Pt had more control when extremity was closer to body. Therapist wheeled Pt back to room. Transfer w/c > bed supervision . Pt left in room with all needs in reach.   2.) Pt seen 1:1 with focus on [facility] with RUE and trunk control. Pt in bed upon arrival. Pt transfer EOB > w/c supervision. In gym Pt sat on therapy ball to address truncal ataxia by moving her hips anteriorly, posteriorly, laterally, and in rotation, pt required tactile cues at hips to promote weight shifting in above directions. Therapist engaged Pt in  RUE FMC tasks such as linking paper clips, unscrewing screws, and removing items from velcro to address deficits that may arise in work environment. Ataxic movements noted and more noticeable when reaching while sustaining grasp on item. Therapist wheeled Pt back to room. Transfer w/c > EOB supervision with stand pivot. Pt left in bed with all needs in reach.    Therapy  Documentation Precautions:  Precautions Precautions: Fall Restrictions Weight Bearing Restrictions: No   Pain: Pain Assessment Pain Assessment: No/denies pain Pain Score: 0-No pain  See FIM for current functional status  Therapy/Group: Individual Therapy  Donna Christen 02/11/2013, 10:49 AM

## 2013-02-11 NOTE — Progress Notes (Signed)
I have reviewed and agree with the attached treatment note.  Jhaniya Briski, OTR/L 

## 2013-02-11 NOTE — Progress Notes (Signed)
Physical Therapy Session Note  Patient Details  Name: [name] MRN: [id number] Date of Birth: [birth year]  Today's Date: 02/11/2013 Time: 1400-1430  Time Calculation (min): 30 min  Short Term Goals: Week 1:  PT Short Term Goal 1 (Week 1): Pt will peform bed mobility on flat bed, no rail with supervision PT Short Term Goal 1 - Progress (Week 1): Met PT Short Term Goal 2 (Week 1): Pt will perform bed <> w/c transfers with min A to L and R side PT Short Term Goal 2 - Progress (Week 1): Met PT Short Term Goal 3 (Week 1): Pt will perform gait in controlled environment x 100' min A PT Short Term Goal 3 - Progress (Week 1): Met PT Short Term Goal 4 (Week 1): Pt will negotiate 3 stairs with one rail and min A PT Short Term Goal 4 - Progress (Week 1): Progressing toward goal  Skilled Therapeutic Interventions/Progress Updates:    Pt received supine in bed. Pt transferred supine>sit supervision and stand pivot bed>w/c min A for trunk steadying. Pt performed Biodex for anterior/posterior, lateral, and diagonal weight shift. Pt had difficulty achieving equal weightbearing through L and R LE but was able to achieve equal stance utilizing visual cue on Biodex. Pt demonstrated difficulty coordinating anterior/posterior weight shift and required manual facilitation for weight shifting and sequencing. Pt demonstrated greater ease and fluidity of movement with lateral and diagonal weight shift. Pt required seated rest break after Biodex. Pt then ambulated 25 ft x2 with L UE support on rail and mod A for trunk positioning, balance, and manual facilitation for weight shift. See details in Locomotion below. Pt ambulated 15 ft without rail and mod A for balance and trunk stability. Pt initially held BUE in high guard position but after VC to relax arms at side demonstrated improved trunk control. Pt transferred w/c>bed with min A for balance and sit>supine mod I. Pt left supine in bed with all needs in reach.    Therapy Documentation Precautions:  Precautions Precautions: Fall Restrictions Weight Bearing Restrictions: No Pain: Pt complained of pain in R arm after balance activity on Biodex. Therapist instructed patient in ROM for wrist and provided brief massage to hand and forearm to reduce pain. Pt reported decrease in pain after ROM and massage.  Locomotion : Ambulation Ambulation: Yes Ambulation/Gait Assistance: 3: Mod assist Ambulation Distance (Feet): 25 Feet (25x2) Assistive device: Other (Comment) (L rail) Ambulation/Gait Assistance Details: Tactile cues for weight shifting;Verbal cues for technique;Tactile cues for posture;Manual facilitation for weight shifting;Manual facilitation for placement Ambulation/Gait Assistance Details: Mod assist for balance, trunk control, and weightshifting onto R with pt utilizing L rail in hallway for support. Pt demonstrated improved gait pattern, sequencing, heel strike, and maintenance of upright trunk position with L rail support vs RW     See FIM for current functional status  Therapy/Group: Individual Therapy  Mickaela Starlin 02/11/2013, 3:24 PM

## 2013-02-11 NOTE — Progress Notes (Signed)
I have reviewed and I agree with the following eval/treatment note.  Yobany Vroom Hall, PT, DPT  

## 2013-02-11 NOTE — Progress Notes (Signed)
Subjective/Complaints: 37 y.o. non-english speaking female with history of DM, HTN, who was admitted on 01/30/13 with right sided weakness and numbness. Last seen normal night---SBP 200 and CBG 307 at admission. MRI brain with acute nonhemorrhagic linear infarcts involving the lateral left thalamus and potentially internal capsule and MRA without significant stenosis. 2 D echo with EF 60-65% and no wall abnormalities. Carotid dopplers without significant ICA stenosis. Neurology recommends ASA 81 mg/daily for secondary stroke prevention as well as risk factor modification. Patient continues with right sided weakness with RLE instability poor weight shifting as well as decreased in coordination and blurred vision L>R.  RUE and  RLE numbness,No RLE burning pain Minimal abdominal discomfort  Review of Systems - Negative except right side weakness  Objective: Vital Signs: Blood pressure 118/80, pulse 79, temperature 98 F (36.7 C), temperature source Oral, resp. rate 16, height 4\' 11"  (1.499 m), weight 71.6 kg (157 lb 13.6 oz), last menstrual period 02/03/2013, SpO2 97.00%. No results found. Results for orders placed during the hospital encounter of 02/03/13 (from the past 72 hour(s))  GLUCOSE, CAPILLARY     Status: Abnormal   Collection Time    02/08/13 11:35 AM      Result Value Range   Glucose-Capillary 159 (*) 70 - 99 mg/dL   Comment 1 Notify RN    GLUCOSE, CAPILLARY     Status: Abnormal   Collection Time    02/08/13  4:23 PM      Result Value Range   Glucose-Capillary 180 (*) 70 - 99 mg/dL  GLUCOSE, CAPILLARY     Status: Abnormal   Collection Time    02/08/13  8:55 PM      Result Value Range   Glucose-Capillary 110 (*) 70 - 99 mg/dL  GLUCOSE, CAPILLARY     Status: Abnormal   Collection Time    02/09/13  7:18 AM      Result Value Range   Glucose-Capillary 129 (*) 70 - 99 mg/dL   Comment 1 Notify RN    GLUCOSE, CAPILLARY     Status: Abnormal   Collection Time    02/09/13 11:50 AM       Result Value Range   Glucose-Capillary 142 (*) 70 - 99 mg/dL  GLUCOSE, CAPILLARY     Status: None   Collection Time    02/09/13  4:44 PM      Result Value Range   Glucose-Capillary 96  70 - 99 mg/dL   Comment 1 Notify RN    GLUCOSE, CAPILLARY     Status: Abnormal   Collection Time    02/09/13  8:43 PM      Result Value Range   Glucose-Capillary 214 (*) 70 - 99 mg/dL   Comment 1 Notify RN    GLUCOSE, CAPILLARY     Status: Abnormal   Collection Time    02/10/13  7:31 AM      Result Value Range   Glucose-Capillary 133 (*) 70 - 99 mg/dL   Comment 1 Notify RN    URINALYSIS, ROUTINE W REFLEX MICROSCOPIC     Status: Abnormal   Collection Time    02/10/13  8:49 AM      Result Value Range   Color, Urine YELLOW  YELLOW   APPearance HAZY (*) CLEAR   Specific Gravity, Urine 1.016  1.005 - 1.030   pH 5.5  5.0 - 8.0   Glucose, UA 250 (*) NEGATIVE mg/dL   Hgb urine dipstick NEGATIVE  NEGATIVE   Bilirubin  Urine NEGATIVE  NEGATIVE   Ketones, ur NEGATIVE  NEGATIVE mg/dL   Protein, ur NEGATIVE  NEGATIVE mg/dL   Urobilinogen, UA 0.2  0.0 - 1.0 mg/dL   Nitrite NEGATIVE  NEGATIVE   Leukocytes, UA NEGATIVE  NEGATIVE   Comment: MICROSCOPIC NOT DONE ON URINES WITH NEGATIVE PROTEIN, BLOOD, LEUKOCYTES, NITRITE, OR GLUCOSE <1000 mg/dL.  GLUCOSE, CAPILLARY     Status: Abnormal   Collection Time    02/10/13 11:37 AM      Result Value Range   Glucose-Capillary 181 (*) 70 - 99 mg/dL  GLUCOSE, CAPILLARY     Status: Abnormal   Collection Time    02/10/13  4:17 PM      Result Value Range   Glucose-Capillary 179 (*) 70 - 99 mg/dL   Comment 1 Notify RN    GLUCOSE, CAPILLARY     Status: Abnormal   Collection Time    02/10/13  8:02 PM      Result Value Range   Glucose-Capillary 154 (*) 70 - 99 mg/dL  GLUCOSE, CAPILLARY     Status: Abnormal   Collection Time    02/11/13  7:23 AM      Result Value Range   Glucose-Capillary 130 (*) 70 - 99 mg/dL      Physical Exam  Constitutional: She is  oriented to person, place, and time. She appears well-developed and well-nourished.  HENT:  Head: Normocephalic and atraumatic.  Eyes: Conjunctivae and EOM are normal. Pupils are equal, round, and reactive to light.  Neck: No tracheal deviation present. No thyromegaly present.  Cardiovascular: Normal rate. Regular rhythm. Right leg with mild ant tibial tenderness, no edema. Homans sign-  Respiratory: Effort normal.  Neurological: She is alert and oriented to person, place, and time. Has good insight and awareness. Answered all basic questions relatively appropriate despite language issues. Good sitting balance. Speech generally clear.  RUE 3+ to 4 deltoid, bicep, tricep, wrist, HI, RLE 3+HF, 4-KE, 4 ADF/APF. Sensory 1 to 1+/2 right face, arm, leg.  Psychiatric:  Mood and affect  appropriate.  Musc: left thigh/hip flexors sore with touch. No swelling or warmth.    Assessment/Plan: 1. Functional deficits secondary to right hemiparesis secondary to left thalamic and left internal capsule lacunar infarcts which require 3+ hours per day of interdisciplinary therapy in a comprehensive inpatient rehab setting. Physiatrist is providing close team supervision and 24 hour management of active medical problems listed below. Physiatrist and rehab team continue to assess barriers to discharge/monitor patient progress toward functional and medical goals. Team conference today please see physician documentation under team conference tab, met with team face-to-face to discuss problems,progress, and goals. Formulized individual treatment plan based on medical history, underlying problem and comorbidities. FIM: FIM - Bathing Bathing Steps Patient Completed: Chest;Right lower leg (including foot);Right Arm;Left lower leg (including foot);Abdomen;Left Arm;Front perineal area;Right upper leg;Buttocks;Left upper leg Bathing: 5: Set-up assist to: Obtain items  FIM - Upper Body Dressing/Undressing Upper body  dressing/undressing steps patient completed: Thread/unthread right sleeve of pullover shirt/dresss;Thread/unthread left sleeve of pullover shirt/dress;Put head through opening of pull over shirt/dress;Pull shirt over trunk Upper body dressing/undressing: 5: Set-up assist to: Obtain clothing/put away FIM - Lower Body Dressing/Undressing Lower body dressing/undressing steps patient completed: Thread/unthread right underwear leg;Thread/unthread left underwear leg;Pull underwear up/down;Don/Doff right sock;Don/Doff left sock;Don/Doff right shoe;Don/Doff left shoe Lower body dressing/undressing: 5: Set-up assist to: Obtain clothing  FIM - Toileting Toileting steps completed by patient: Adjust clothing prior to toileting;Performs perineal hygiene;Adjust clothing after toileting Toileting  Assistive Devices: Grab bar or rail for support Toileting: 5: Supervision: Safety issues/verbal cues  FIM - Diplomatic Services operational officer Devices: Grab bars;Walker Toilet Transfers: 5-To toilet/BSC: Supervision (verbal cues/safety issues)  FIM - Banker Devices: Therapist, occupational: 5: Sit > Supine: Supervision (verbal cues/safety issues);4: Chair or W/C > Bed: Min A (steadying Pt. > 75%)  FIM - Locomotion: Wheelchair Distance: 150 Locomotion: Wheelchair: 0: Activity did not occur FIM - Locomotion: Ambulation Locomotion: Ambulation Assistive Devices: Designer, industrial/product Ambulation/Gait Assistance: 4: Min guard Locomotion: Ambulation: 4: Travels 150 ft or more with minimal assistance (Pt.>75%)  Comprehension Comprehension Mode: Auditory Comprehension: 5-Follows basic conversation/direction: With no assist  Expression Expression Mode: Verbal Expression: 5-Expresses basic 90% of the time/requires cueing < 10% of the time.  Social Interaction Social Interaction: 6-Interacts appropriately with others with medication or extra time (anti-anxiety,  antidepressant).  Problem Solving Problem Solving: 5-Solves complex 90% of the time/cues < 10% of the time  Memory Memory: 5-Recognizes or recalls 90% of the time/requires cueing < 10% of the time  Medical Problem List and Plan:  1. Left thalamic internal capsule infarct likely due to small vessel disease  2. DVT Prophylaxis/Anticoagulation: SCDs. Monitor for any signs of DVT  , has mild leg pain but no other signs of DVT  3. Pain Management: Tylenol as needed  -increase gabapentin to 200mg  BID for neuropathic pain, thalamic pain syndrome +/- diabetic neuropathy  -left thigh appears musculoskeletal--improved today, continue heat/tylenol 4. Neuropsych: This patient is capable of making decisions on her own behalf.  5. Hypertension. Lisinopril 5 mg daily. bp improved for the most part  6. Diabetes mellitus with peripheral neuropathy. Hemoglobin A1c 13.1.lantus insulin titrated up  with improvement.      LOS (Days) 8 A FACE TO FACE EVALUATION WAS PERFORMED  KIRSTEINS,ANDREW E 02/11/2013, 9:19 AM

## 2013-02-12 ENCOUNTER — Inpatient Hospital Stay (HOSPITAL_COMMUNITY): Payer: BC Managed Care – PPO | Admitting: Physical Therapy

## 2013-02-12 ENCOUNTER — Inpatient Hospital Stay (HOSPITAL_COMMUNITY): Payer: BC Managed Care – PPO | Admitting: Occupational Therapy

## 2013-02-12 DIAGNOSIS — I6330 Cerebral infarction due to thrombosis of unspecified cerebral artery: Secondary | ICD-10-CM

## 2013-02-12 DIAGNOSIS — I69998 Other sequelae following unspecified cerebrovascular disease: Secondary | ICD-10-CM

## 2013-02-12 DIAGNOSIS — R209 Unspecified disturbances of skin sensation: Secondary | ICD-10-CM

## 2013-02-12 LAB — GLUCOSE, CAPILLARY
Glucose-Capillary: 107 mg/dL — ABNORMAL HIGH (ref 70–99)
Glucose-Capillary: 149 mg/dL — ABNORMAL HIGH (ref 70–99)
Glucose-Capillary: 98 mg/dL (ref 70–99)
Glucose-Capillary: 99 mg/dL (ref 70–99)

## 2013-02-12 NOTE — Progress Notes (Signed)
I have reviewed and agree with the attached treatment note.  Adelie Croswell, OTR/L 

## 2013-02-12 NOTE — Plan of Care (Signed)
Problem: RH SAFETY Goal: RH STG ADHERE TO SAFETY PRECAUTIONS W/ASSISTANCE/DEVICE STG Adhere to Safety Precautions With min Assistance/Device.  Outcome: Not Progressing Patient found out of bed at sink with no assistance by staff. Bed alarm in use at bedtime, and patient reoriented to call bell.

## 2013-02-12 NOTE — Progress Notes (Signed)
Social Work Patient ID: [name], female   DOB: 07-13-1975, 37 y.o.   MRN: [id number]  CSW met with pt to update her on team conference.  Therapists feel pt is making good progress and that she is still on target to d/c on 02-18-13.  Pt was unsure if her brother found her a primary care doctor yet and who would be taking her home from the hospital.  CSW called pt's brother, [name], and found that he has found a PCP for pt at [facility], Dr. Theora Gianotti.  Brother will be able to come to get her the morning of her d/c.  Brother is concerned because pt helps with living expenses in his home by paying her rent and sharing bills with him.  CSW explained that CSW was planning to help pt apply for disability.  Emphasized that this is not a quick process and that pt would not receive a check until approved and that could take months.  It does not seem that pt and her brother have a formal rental lease and this may make it more difficult to apply for emergency assistance. Pt pays him rent and shares the utility bills. CSW did not have an interpreter for today's visit, so CSW arranged for interpreter to come tomorrow so that we could talk further about community resources and complete a disability application.

## 2013-02-12 NOTE — Progress Notes (Signed)
Physical Therapy Session Note  Patient Details  Name: [name] MRN: [id number] Date of Birth: [birth year]  Today's Date: 02/12/2013 Time: 3086-5784 Time Calculation (min): 41 min  Short Term Goals: Week 2:  PT Short Term Goal 1 (Week 2): = LTG  Skilled Therapeutic Interventions/Progress Updates:   Pt received in bed; reporting soreness in RLE from am therapy but pt agreeable to therapy.  Performed supine > sit EOB mod I and donned shoes mod I EOB.  Performed gait with RW x 150' in controlled environment with supervision with pt demonstrating improved upright trunk posture, safe distance to RW and improved bilat step and stride length and ability to maintain forward momentum but still presents with R genu recurvatum and slight hip flexion in stance on RLE and decreased advancement over RLE.  Performed gait assessment with use of quad cane in LUE to decrease dependence on AD and UE and for increased trunk control; performed gait x 75' in controlled environment with quad cane and min A.  Pt initially able to maintain upright trunk posture and 3 point gait pattern; as pt fatigued she presented with L lateral trunk lean and decreased ability to control forward momentum.  For D/C therapy still recommending use of RW for increased safety and independence with home mobility.  Performed NMR; see below for details.  Returned to room and to bed and Kpad placed on RLE for pain management and to relieve muscle tension.  Therapy Documentation Precautions:  Precautions Precautions: Fall Restrictions Weight Bearing Restrictions: No Pain: Pain Assessment Pain Assessment: Faces Faces Pain Scale: Hurts little more Pain Type: Neuropathic pain Pain Location: Leg Pain Orientation: Right;Lateral Pain Descriptors / Indicators: Sore Pain Onset: Gradual Pain Intervention(s): Heat applied Other Treatments: Treatments Neuromuscular Facilitation: Right;Lower Extremity;Upper Extremity;Forced use;Activity to  increase sustained activation;Activity to increase lateral weight shifting;Activity to increase anterior-posterior weight shifting;Activity to increase motor control;Activity to increase coordination;Activity to increase timing and sequencing while performing sit > stand and performing stand <> squat (tapping buttocks to mat and then returning to standing) with LLE extended in front of pt to increase use and sustained concentric and eccentric activation of RLE 2 sets x 8 reps without UE support.  Changed to standing with LLE beside mat and WB through R knee on mat during LLE forward and retro stepping/advancement beginning with LUE support on RW progressing to no UE support for increased trunk control, active R lateral weight shift, weight acceptance on RLE and stabilization for stance phase and for facilitation of R terminal hip extension.  Increased assistance needed for trunk control and weight shifting needed without UE support.  Continued NMR during gait without AD but with pt placing bilat UE on therapist's shoulders during gait x 120' and therapist providing tactile cues at ribs and pelvis for pelvic rotation, lateral weight shifting and full anterior translation of COG over RLE during stance; as pt fatigued she required increased assistance for trunk control and began "slapping" L foot on floor at initial stance.     See FIM for current functional status  Therapy/Group: Individual Therapy  Edman Circle [facility] 02/12/2013, 2:28 PM

## 2013-02-12 NOTE — Patient Care Conference (Signed)
Inpatient RehabilitationTeam Conference and Plan of Care Update Date: 02/11/2013   Time: 11:30 AM    Patient Name: [name]      Medical Record Number: [id number]  Date of Birth: [birth year] Sex: Female         Room/Bed: 4W12C/4W12C-01 Payor Info: Payor: BLUE CROSS BLUE SHIELD / Plan: BCBS  PPO / Product Type: *No Product type* /    Admitting Diagnosis: CVA  Admit Date/Time:  02/03/2013  6:11 PM Admission Comments: No comment available   Primary Diagnosis:  CVA (cerebral infarction) Principal Problem: CVA (cerebral infarction)  Patient Active Problem List   Diagnosis Date Noted  . CVA (cerebral infarction) 02/04/2013  . Acute ischemic stroke 01/31/2013  . Hypokalemia 01/31/2013  . Right sided weakness 01/30/2013  . HTN (hypertension) 01/30/2013  . DM (diabetes mellitus) 01/30/2013    Expected Discharge Date: Expected Discharge Date: 02/18/13  Team Members Present: Physician leading conference: Dr. Claudette Laws Social Worker Present: Staci Acosta, LCSW Nurse Present: Other (comment) Ronny Bacon, RN) PT Present: Edman Circle, PT OT Present: Rosalio Loud, Loistine Chance, OT SLP Present: Fae Pippin, SLP     Current Status/Progress Goal Weekly Team Focus  Medical   Diabetes, neuropathic pain improving, mild abdominal pain  Achieve adequate blood sugars, instruct patient in terms of proper management  Medication adjustment, dietary education   Bowel/Bladder   Continent of bowel and bladder  Remain continent of bowel and bladder  assess s/s of constipation   Swallow/Nutrition/ Hydration             ADL's   Supervison bathing, Min A transfers, Min A/ steady assist LB dressing, supervison UB dressing   supervision - mod I  standing balance, RUE strength, transfers, and coordination, [facility]   Mobility   S-minA overall  Supervision-mod I  safety, gait, transfers, balance, R LE and R UE NMR, postural control, stair negotiation   Communication              Safety/Cognition/ Behavioral Observations            Pain   Denies pain  < 3  Monitor and assess for pain q shift and prn   Skin   No skin breakdown noted  Remain free from skin breakdown or infection  Assess patients skin q shift and prn    Rehab Goals Patient on target to meet rehab goals: Yes Rehab Goals Revised: None *See Care Plan and progress notes for long and short-term goals.  Barriers to Discharge: Requires physical assistance    Possible Resolutions to Barriers:  Identify identify caregiver for discharge    Discharge Planning/Teaching Needs:  Pt  plans to return home at d/c.  Brother will come for family education as therapists indicate a need.   Team Discussion:  Pt is doing well medically, but will need diabetic teaching so that she can manage her diabetes at home.  Pt's neuropathic pain is improving.  Pt is making steady progress in PT and is beginning to get some grip strength back.  Therapists are currently working on trunk control and balance.  Pt continues to have numbness with fine motor as reported by OT, but is gaining a functional grasp.  Sh will need a youth rolling walker and tub bench for use at home.    Revisions to Treatment Plan:  Plan continues for 02-18-13 d/c.   Continued Need for Acute Rehabilitation Level of Care: The patient requires daily medical management by a physician with specialized training in  physical medicine and rehabilitation for the following conditions: Daily direction of a multidisciplinary physical rehabilitation program to ensure safe treatment while eliciting the highest outcome that is of practical value to the patient.: Yes Daily medical management of patient stability for increased activity during participation in an intensive rehabilitation regime.: Yes Daily analysis of laboratory values and/or radiology reports with any subsequent need for medication adjustment of medical intervention for : Neurological  problems;Other  Sawsan Riggio, [place] 02/12/2013, 9:00 AM

## 2013-02-12 NOTE — Progress Notes (Signed)
Occupational Therapy Session Note  Patient Details  Name: [name] MRN: [id number] Date of Birth: 05/02/1975  Today's Date: 02/12/2013 Time: 9147-8295 and 1131-1201 Time Calculation (min): 57 and 30  Short Term Goals: Week 2:  OT Short Term Goal 1 (Week 2): Pt will complete LB dressing with supervision. OT Short Term Goal 2 (Week 2): Pt will complete simple meal prep with supervision with use of RUE as nondominant hand. OT Short Term Goal 3 (Week 2): STG=LTG Mod I overall for self care  Skilled Therapeutic Interventions/Progress Updates:  1.) Pt seen for ADL retraining with focus on functional ambulation with RW and increased participation and safety with LB dressing. Pt in bed upon arrival. Pt ambulated with RW throughout room to gather clothing for bathing and dressing. Pt completed bathing supervision and ambulated to sink to complete LB dressing in order to stabilize herself while standing, pulled up underwear while holding sink with opposite hand. In gym therapist engaged Pt in weight shifting activity in standing while reaching and grasping sticky notes with RUE.Pt unstable without UE support, Pt used LUE to stabilize self with RW when weight shifting.  Ataxic movements noted when RUE was in >90 degree shoulder flexion and increased ataxic movements when fatigued. Pt required rest breaks between activities. Therapist wheeled Pt back to room, Pt left in bed with all needs in reach.   2.) Pt seen 1:1 OT with focus on functional ambulation in kitchen, reaching for items using RUE, and sit < > stand from low chair without armrests. Pt ambulated with RW through kitchen to retrieve items from cabinets at eye level. Educated Pt on requiring supervision when in the kitchen due to decreased balance for safety, recommend supervision with all cooking tasks. Pt required rest breaks during activities. Educated Pt on sitting/standing from low chair without armrests. Pt returned demonstration. Therapist  wheeled Pt back to room. Pt left sitting in w/c with all needs in reach.   Therapy Documentation Precautions:  Precautions Precautions: Fall Restrictions Weight Bearing Restrictions: No Pain: Pain Assessment Pain Assessment: No/denies pain Pain Score: 0-No pain  See FIM for current functional status  Therapy/Group: Individual Therapy  Donna Christen 02/12/2013, 12:16 PM

## 2013-02-12 NOTE — Progress Notes (Addendum)
I have reviewed and I agree with the following eval/treatment note.  Edman Circle, PT, DPT     ADDENDUM:  Treatment session performed with Shiela Mayer, PTA I have reviewed and agree with the treatment note.

## 2013-02-12 NOTE — Progress Notes (Signed)
Physical Therapy Session Note  Patient Details  Name: [name] MRN: [id number] Date of Birth: Oct 19, 1975  Today's Date: 02/12/2013 Time:  3474-2595 Time calculation: 58 min    Short Term Goals: Week 1:  PT Short Term Goal 1 (Week 1): Pt will peform bed mobility on flat bed, no rail with supervision PT Short Term Goal 1 - Progress (Week 1): Met PT Short Term Goal 2 (Week 1): Pt will perform bed <> w/c transfers with min A to L and R side PT Short Term Goal 2 - Progress (Week 1): Met PT Short Term Goal 3 (Week 1): Pt will perform gait in controlled environment x 100' min A PT Short Term Goal 3 - Progress (Week 1): Met PT Short Term Goal 4 (Week 1): Pt will negotiate 3 stairs with one rail and min A PT Short Term Goal 4 - Progress (Week 1): Progressing toward goal  Skilled Therapeutic Interventions/Progress Updates:    Pt received lying in bed. Pt transferred supine>sit mod I and sit>stand supervision pushing off bed with LUE and R hand on RW. Pt ambulated 125 feet to gym with RW min guard. Treatment focused on NMR for balance, weight shifting, single leg support, and trunk control. Pt performed seated heel tapping onto visual markers alternating feet. Pt performed mini lunge onto visual markers in standing with mod A for balance and trunk control and manual facilitation for appropriate weight shift and knee flexion on R. Pt performed stepping onto 4 inch step RLE x5, LLE x5, and alternating x10 mod A for balance and manual facilitation, verbal and visual cues for weight shift onto R. Pt performed standing reaching activities to facilitate anterior/posterior and lateral weight shift with step under L foot to facilitate weightbearing through R. Pt performed lateral weight shift with trunk rotation in tall kneeling and quadruped alternating arm extensions x5 alternating leg extensions x10. Pt ambulated 125 to room with RW min guard. Pt demonstrated improved weight bearing/stance time on R and  increased step length on L. Pt transferred stand>sit supervision and sit>supine mod I. Pt left in bed with all needs in reach.   Therapy Documentation Precautions:  Precautions Precautions: Fall Restrictions Weight Bearing Restrictions: No Pain: Pain Assessment Pain Assessment: No/denies pain Pain Score: 0-No pain Mobility: Bed Mobility Bed Mobility: Supine to Sit;Sit to Supine Supine to Sit: 6: Modified independent (Device/Increase time) Sit to Supine: 6: Modified independent (Device/Increase time) Transfers Transfers: Yes Sit to Stand: 5: Supervision Stand to Sit: 5: Supervision Locomotion : Ambulation Ambulation: Yes Ambulation/Gait Assistance: 4: Min guard Ambulation Distance (Feet): 125 Feet Assistive device: Rolling walker Ambulation/Gait Assistance Details: Verbal cues for gait pattern;Verbal cues for technique    Other Treatments: Treatments Neuromuscular Facilitation: Activity to increase coordination;Activity to increase motor control;Activity to increase lateral weight shifting;Activity to increase anterior-posterior weight shifting  See FIM for current functional status  Therapy/Group: Individual Therapy  Maziah Keeling 02/12/2013, 11:44 AM

## 2013-02-12 NOTE — Progress Notes (Signed)
Subjective/Complaints: 37 y.o. non-english speaking female with history of DM, HTN, who was admitted on 01/30/13 with right sided weakness and numbness. Last seen normal night---SBP 200 and CBG 307 at admission. MRI brain with acute nonhemorrhagic linear infarcts involving the lateral left thalamus and potentially internal capsule and MRA without significant stenosis. 2 D echo with EF 60-65% and no wall abnormalities. Carotid dopplers without significant ICA stenosis. Neurology recommends ASA 81 mg/daily for secondary stroke prevention as well as risk factor modification. Patient continues with right sided weakness with RLE instability poor weight shifting as well as decreased in coordination and blurred vision L>R.  RUE and  RLE numbness,does not want med changes for pain no abdominal discomfort  Review of Systems - Negative except right side weakness  Objective: Vital Signs: Blood pressure 111/73, pulse 74, temperature 98.1 F (36.7 C), temperature source Oral, resp. rate 18, height 4\' 11"  (1.499 m), weight 69.4 kg (153 lb), last menstrual period 02/03/2013, SpO2 98.00%. No results found. Results for orders placed during the hospital encounter of 02/03/13 (from the past 72 hour(s))  GLUCOSE, CAPILLARY     Status: Abnormal   Collection Time    02/09/13 11:50 AM      Result Value Range   Glucose-Capillary 142 (*) 70 - 99 mg/dL  GLUCOSE, CAPILLARY     Status: None   Collection Time    02/09/13  4:44 PM      Result Value Range   Glucose-Capillary 96  70 - 99 mg/dL   Comment 1 Notify RN    GLUCOSE, CAPILLARY     Status: Abnormal   Collection Time    02/09/13  8:43 PM      Result Value Range   Glucose-Capillary 214 (*) 70 - 99 mg/dL   Comment 1 Notify RN    GLUCOSE, CAPILLARY     Status: Abnormal   Collection Time    02/10/13  7:31 AM      Result Value Range   Glucose-Capillary 133 (*) 70 - 99 mg/dL   Comment 1 Notify RN    URINALYSIS, ROUTINE W REFLEX MICROSCOPIC     Status:  Abnormal   Collection Time    02/10/13  8:49 AM      Result Value Range   Color, Urine YELLOW  YELLOW   APPearance HAZY (*) CLEAR   Specific Gravity, Urine 1.016  1.005 - 1.030   pH 5.5  5.0 - 8.0   Glucose, UA 250 (*) NEGATIVE mg/dL   Hgb urine dipstick NEGATIVE  NEGATIVE   Bilirubin Urine NEGATIVE  NEGATIVE   Ketones, ur NEGATIVE  NEGATIVE mg/dL   Protein, ur NEGATIVE  NEGATIVE mg/dL   Urobilinogen, UA 0.2  0.0 - 1.0 mg/dL   Nitrite NEGATIVE  NEGATIVE   Leukocytes, UA NEGATIVE  NEGATIVE   Comment: MICROSCOPIC NOT DONE ON URINES WITH NEGATIVE PROTEIN, BLOOD, LEUKOCYTES, NITRITE, OR GLUCOSE <1000 mg/dL.  GLUCOSE, CAPILLARY     Status: Abnormal   Collection Time    02/10/13 11:37 AM      Result Value Range   Glucose-Capillary 181 (*) 70 - 99 mg/dL  GLUCOSE, CAPILLARY     Status: Abnormal   Collection Time    02/10/13  4:17 PM      Result Value Range   Glucose-Capillary 179 (*) 70 - 99 mg/dL   Comment 1 Notify RN    GLUCOSE, CAPILLARY     Status: Abnormal   Collection Time    02/10/13  8:02 PM  Result Value Range   Glucose-Capillary 154 (*) 70 - 99 mg/dL  GLUCOSE, CAPILLARY     Status: Abnormal   Collection Time    02/11/13  7:23 AM      Result Value Range   Glucose-Capillary 130 (*) 70 - 99 mg/dL  GLUCOSE, CAPILLARY     Status: Abnormal   Collection Time    02/11/13 11:39 AM      Result Value Range   Glucose-Capillary 118 (*) 70 - 99 mg/dL  GLUCOSE, CAPILLARY     Status: Abnormal   Collection Time    02/11/13  4:47 PM      Result Value Range   Glucose-Capillary 104 (*) 70 - 99 mg/dL   Comment 1 Notify RN    GLUCOSE, CAPILLARY     Status: None   Collection Time    02/11/13  9:30 PM      Result Value Range   Glucose-Capillary 87  70 - 99 mg/dL   Comment 1 Notify RN    GLUCOSE, CAPILLARY     Status: Abnormal   Collection Time    02/12/13  7:27 AM      Result Value Range   Glucose-Capillary 107 (*) 70 - 99 mg/dL   Comment 1 Notify RN        Physical Exam   Constitutional: She is oriented to person, place, and time. She appears well-developed and well-nourished.  HENT:  Head: Normocephalic and atraumatic.  Eyes: Conjunctivae and EOM are normal. Pupils are equal, round, and reactive to light.  Neck: No tracheal deviation present. No thyromegaly present.  Cardiovascular: Normal rate. Regular rhythm. Right leg with mild ant tibial tenderness, no edema. Homans sign-  Respiratory: Effort normal.  Neurological: She is alert and oriented to person, place, and time. Has good insight and awareness. Answered all basic questions relatively appropriate despite language issues. Good sitting balance. Speech generally clear.  RUE 3+ to 4 deltoid, bicep, tricep, wrist, HI, RLE 3+HF, 4-KE, 4 ADF/APF. Sensory 1 to 1+/2 right face, arm, leg.  Psychiatric:  Mood and affect  appropriate.  Musc: left thigh/hip flexors sore with touch. No swelling or warmth.    Assessment/Plan: 1. Functional deficits secondary to right hemiparesis secondary to left thalamic and left internal capsule lacunar infarcts which require 3+ hours per day of interdisciplinary therapy in a comprehensive inpatient rehab setting. Physiatrist is providing close team supervision and 24 hour management of active medical problems listed below. Physiatrist and rehab team continue to assess barriers to discharge/monitor patient progress toward functional and medical goals.  FIM: FIM - Bathing Bathing Steps Patient Completed: Chest;Abdomen;Right upper leg;Left lower leg (including foot);Right Arm;Front perineal area;Left upper leg;Left Arm;Buttocks;Right lower leg (including foot) Bathing: 5: Supervision: Safety issues/verbal cues  FIM - Upper Body Dressing/Undressing Upper body dressing/undressing steps patient completed: Thread/unthread right sleeve of pullover shirt/dresss;Thread/unthread left sleeve of pullover shirt/dress;Put head through opening of pull over shirt/dress;Pull shirt over  trunk;Thread/unthread right sleeve of front closure shirt/dress;Thread/unthread left sleeve of front closure shirt/dress;Pull shirt around back of front closure shirt/dress;Button/unbutton shirt Upper body dressing/undressing: 5: Set-up assist to: Obtain clothing/put away FIM - Lower Body Dressing/Undressing Lower body dressing/undressing steps patient completed: Pull underwear up/down;Thread/unthread right underwear leg;Thread/unthread left underwear leg;Don/Doff right sock;Don/Doff right shoe;Don/Doff left sock;Don/Doff left shoe Lower body dressing/undressing: 4: Steadying Assist  FIM - Toileting Toileting steps completed by patient: Adjust clothing prior to toileting;Performs perineal hygiene;Adjust clothing after toileting Toileting Assistive Devices: Grab bar or rail for support Toileting: 5: Supervision:  Safety issues/verbal cues  FIM - Diplomatic Services operational officer Devices: Grab bars Toilet Transfers: 5-To toilet/BSC: Supervision (verbal cues/safety issues);5-From toilet/BSC: Supervision (verbal cues/safety issues)  FIM - Banker Devices: Walker;Bed rails Bed/Chair Transfer: 5: Supine > Sit: Supervision (verbal cues/safety issues);6: Sit > Supine: No assist;4: Bed > Chair or W/C: Min A (steadying Pt. > 75%);4: Chair or W/C > Bed: Min A (steadying Pt. > 75%)  FIM - Locomotion: Wheelchair Distance: 150 Locomotion: Wheelchair: 0: Activity did not occur FIM - Locomotion: Ambulation Locomotion: Ambulation Assistive Devices: Other (comment) (L rail) Ambulation/Gait Assistance: 3: Mod assist Locomotion: Ambulation: 1: Travels less than 50 ft with moderate assistance (Pt: 50 - 74%)  Comprehension Comprehension Mode: Auditory Comprehension: 5-Follows basic conversation/direction: With no assist  Expression Expression Mode: Verbal Expression: 5-Expresses basic 90% of the time/requires cueing < 10% of the time.  Social  Interaction Social Interaction: 6-Interacts appropriately with others with medication or extra time (anti-anxiety, antidepressant).  Problem Solving Problem Solving: 5-Solves basic 90% of the time/requires cueing < 10% of the time  Memory Memory: 5-Recognizes or recalls 90% of the time/requires cueing < 10% of the time  Medical Problem List and Plan:  1. Left thalamic internal capsule infarct likely due to small vessel disease  2. DVT Prophylaxis/Anticoagulation: SCDs. Monitor for any signs of DVT  , has mild leg pain but no other signs of DVT  3. Pain Management: Tylenol as needed  -increase gabapentin to 200mg  BID for neuropathic pain, thalamic pain syndrome +/- diabetic neuropathy  -left thigh appears musculoskeletal--improved today, continue heat/tylenol 4. Neuropsych: This patient is capable of making decisions on her own behalf.  5. Hypertension. Lisinopril 5 mg daily. bp improved for the most part  6. Diabetes mellitus with peripheral neuropathy. Hemoglobin A1c 13.1.lantus insulin titrated up  with improvement.      LOS (Days) 9 A FACE TO FACE EVALUATION WAS PERFORMED  [name] E 02/12/2013, 8:42 AM

## 2013-02-13 ENCOUNTER — Encounter (HOSPITAL_COMMUNITY): Payer: BC Managed Care – PPO | Admitting: Occupational Therapy

## 2013-02-13 ENCOUNTER — Inpatient Hospital Stay (HOSPITAL_COMMUNITY): Payer: BC Managed Care – PPO | Admitting: Physical Therapy

## 2013-02-13 ENCOUNTER — Inpatient Hospital Stay (HOSPITAL_COMMUNITY): Payer: BC Managed Care – PPO

## 2013-02-13 DIAGNOSIS — I69998 Other sequelae following unspecified cerebrovascular disease: Secondary | ICD-10-CM

## 2013-02-13 DIAGNOSIS — I6330 Cerebral infarction due to thrombosis of unspecified cerebral artery: Secondary | ICD-10-CM

## 2013-02-13 DIAGNOSIS — G8110 Spastic hemiplegia affecting unspecified side: Secondary | ICD-10-CM

## 2013-02-13 DIAGNOSIS — R209 Unspecified disturbances of skin sensation: Secondary | ICD-10-CM

## 2013-02-13 LAB — GLUCOSE, CAPILLARY
Glucose-Capillary: 119 mg/dL — ABNORMAL HIGH (ref 70–99)
Glucose-Capillary: 159 mg/dL — ABNORMAL HIGH (ref 70–99)
Glucose-Capillary: 104 mg/dL — ABNORMAL HIGH (ref 70–99)
Glucose-Capillary: 119 mg/dL — ABNORMAL HIGH (ref 70–99)

## 2013-02-13 NOTE — Progress Notes (Signed)
Subjective/Complaints: 37 y.o. non-english speaking female with history of DM, HTN, who was admitted on 01/30/13 with right sided weakness and numbness. Last seen normal night---SBP 200 and CBG 307 at admission. MRI brain with acute nonhemorrhagic linear infarcts involving the lateral left thalamus and potentially internal capsule and MRA without significant stenosis. 2 D echo with EF 60-65% and no wall abnormalities. Carotid dopplers without significant ICA stenosis. Neurology recommends ASA 81 mg/daily for secondary stroke prevention as well as risk factor modification. Patient continues with right sided weakness with RLE instability poor weight shifting as well as decreased in coordination and blurred vision L>R.  No pain complaints. Still has some numbness on the right side.  Review of Systems - Negative except right side weakness  Objective: Vital Signs: Blood pressure 125/79, pulse 83, temperature 98.1 F (36.7 C), temperature source Oral, resp. rate 19, height 4\' 11"  (1.499 m), weight 69.4 kg (153 lb), last menstrual period 02/03/2013, SpO2 99.00%. No results found. Results for orders placed during the hospital encounter of 02/03/13 (from the past 72 hour(s))  URINALYSIS, ROUTINE W REFLEX MICROSCOPIC     Status: Abnormal   Collection Time    02/10/13  8:49 AM      Result Value Range   Color, Urine YELLOW  YELLOW   APPearance HAZY (*) CLEAR   Specific Gravity, Urine 1.016  1.005 - 1.030   pH 5.5  5.0 - 8.0   Glucose, UA 250 (*) NEGATIVE mg/dL   Hgb urine dipstick NEGATIVE  NEGATIVE   Bilirubin Urine NEGATIVE  NEGATIVE   Ketones, ur NEGATIVE  NEGATIVE mg/dL   Protein, ur NEGATIVE  NEGATIVE mg/dL   Urobilinogen, UA 0.2  0.0 - 1.0 mg/dL   Nitrite NEGATIVE  NEGATIVE   Leukocytes, UA NEGATIVE  NEGATIVE   Comment: MICROSCOPIC NOT DONE ON URINES WITH NEGATIVE PROTEIN, BLOOD, LEUKOCYTES, NITRITE, OR GLUCOSE <1000 mg/dL.  GLUCOSE, CAPILLARY     Status: Abnormal   Collection Time   02/10/13 11:37 AM      Result Value Range   Glucose-Capillary 181 (*) 70 - 99 mg/dL  GLUCOSE, CAPILLARY     Status: Abnormal   Collection Time    02/10/13  4:17 PM      Result Value Range   Glucose-Capillary 179 (*) 70 - 99 mg/dL   Comment 1 Notify RN    GLUCOSE, CAPILLARY     Status: Abnormal   Collection Time    02/10/13  8:02 PM      Result Value Range   Glucose-Capillary 154 (*) 70 - 99 mg/dL  GLUCOSE, CAPILLARY     Status: Abnormal   Collection Time    02/11/13  7:23 AM      Result Value Range   Glucose-Capillary 130 (*) 70 - 99 mg/dL  GLUCOSE, CAPILLARY     Status: Abnormal   Collection Time    02/11/13 11:39 AM      Result Value Range   Glucose-Capillary 118 (*) 70 - 99 mg/dL  GLUCOSE, CAPILLARY     Status: Abnormal   Collection Time    02/11/13  4:47 PM      Result Value Range   Glucose-Capillary 104 (*) 70 - 99 mg/dL   Comment 1 Notify RN    GLUCOSE, CAPILLARY     Status: None   Collection Time    02/11/13  9:30 PM      Result Value Range   Glucose-Capillary 87  70 - 99 mg/dL   Comment 1  Notify RN    GLUCOSE, CAPILLARY     Status: Abnormal   Collection Time    02/12/13  7:27 AM      Result Value Range   Glucose-Capillary 107 (*) 70 - 99 mg/dL   Comment 1 Notify RN    GLUCOSE, CAPILLARY     Status: Abnormal   Collection Time    02/12/13 12:15 PM      Result Value Range   Glucose-Capillary 149 (*) 70 - 99 mg/dL   Comment 1 Notify RN    GLUCOSE, CAPILLARY     Status: None   Collection Time    02/12/13  4:44 PM      Result Value Range   Glucose-Capillary 98  70 - 99 mg/dL   Comment 1 Notify RN    GLUCOSE, CAPILLARY     Status: None   Collection Time    02/12/13  9:31 PM      Result Value Range   Glucose-Capillary 99  70 - 99 mg/dL   Comment 1 Notify RN    GLUCOSE, CAPILLARY     Status: Abnormal   Collection Time    02/13/13  7:27 AM      Result Value Range   Glucose-Capillary 119 (*) 70 - 99 mg/dL      Physical Exam  Constitutional: She is  oriented to person, place, and time. She appears well-developed and well-nourished.  HENT:  Head: Normocephalic and atraumatic.  Eyes: Conjunctivae and EOM are normal. Pupils are equal, round, and reactive to light.  Neck: No tracheal deviation present. No thyromegaly present.  Cardiovascular: Normal rate. Regular rhythm. Right leg with mild ant tibial tenderness, no edema. Homans sign-  Respiratory: Effort normal.  Neurological: She is alert and oriented to person, place, and time. Has good insight and awareness. Answered all basic questions relatively appropriate despite language issues. Good sitting balance. Speech generally clear.  RUE 3+ to 4 deltoid, bicep, tricep, wrist, HI, RLE 3+HF, 4-KE, 4 ADF/APF. Sensory 1 to 1+/2 right face, arm, leg.  Psychiatric:  Mood and affect  appropriate.  Musc: left thigh/hip flexors sore with touch. No swelling or warmth.    Assessment/Plan: 1. Functional deficits secondary to right hemiparesis secondary to left thalamic and left internal capsule lacunar infarcts which require 3+ hours per day of interdisciplinary therapy in a comprehensive inpatient rehab setting. Physiatrist is providing close team supervision and 24 hour management of active medical problems listed below. Physiatrist and rehab team continue to assess barriers to discharge/monitor patient progress toward functional and medical goals. Tentative discharge date of 02/16/2013 FIM: FIM - Bathing Bathing Steps Patient Completed: Chest;Right upper leg;Right Arm;Left upper leg;Left Arm;Right lower leg (including foot);Abdomen;Left lower leg (including foot);Front perineal area;Buttocks Bathing: 5: Supervision: Safety issues/verbal cues  FIM - Upper Body Dressing/Undressing Upper body dressing/undressing steps patient completed: Thread/unthread left sleeve of pullover shirt/dress;Put head through opening of pull over shirt/dress;Pull shirt over trunk;Thread/unthread right sleeve of front  closure shirt/dress;Thread/unthread left sleeve of front closure shirt/dress;Pull shirt around back of front closure shirt/dress;Button/unbutton shirt;Thread/unthread right sleeve of pullover shirt/dresss Upper body dressing/undressing: 5: Supervision: Safety issues/verbal cues FIM - Lower Body Dressing/Undressing Lower body dressing/undressing steps patient completed: Thread/unthread right underwear leg;Thread/unthread left underwear leg;Pull underwear up/down;Don/Doff right sock;Don/Doff left sock;Don/Doff right shoe;Don/Doff left shoe Lower body dressing/undressing: 5: Supervision: Safety issues/verbal cues  FIM - Toileting Toileting steps completed by patient: Adjust clothing prior to toileting;Performs perineal hygiene;Adjust clothing after toileting Toileting Assistive Devices: Grab bar or rail for  support Toileting: 5: Supervision: Safety issues/verbal cues  FIM - Archivist Transfers Assistive Devices: Elevated toilet seat;Grab bars;Walker Toilet Transfers: 5-To toilet/BSC: Supervision (verbal cues/safety issues);5-From toilet/BSC: Supervision (verbal cues/safety issues)  FIM - Banker Devices: Walker;Bed rails Bed/Chair Transfer: 7: Supine > Sit: No assist;7: Sit > Supine: No assist;5: Bed > Chair or W/C: Supervision (verbal cues/safety issues);5: Chair or W/C > Bed: Supervision (verbal cues/safety issues)  FIM - Locomotion: Wheelchair Distance: 150 Locomotion: Wheelchair: 0: Activity did not occur FIM - Locomotion: Ambulation Locomotion: Ambulation Assistive Devices: Designer, industrial/product Ambulation/Gait Assistance: 4: Min guard Locomotion: Ambulation: 2: Travels 50 - 149 ft with supervision/safety issues  Comprehension Comprehension Mode: Auditory Comprehension: 5-Follows basic conversation/direction: With no assist  Expression Expression Mode: Verbal Expression: 5-Expresses basic 90% of the time/requires cueing < 10% of the  time.  Social Interaction Social Interaction: 6-Interacts appropriately with others with medication or extra time (anti-anxiety, antidepressant).  Problem Solving Problem Solving: 5-Solves complex 90% of the time/cues < 10% of the time  Memory Memory: 5-Recognizes or recalls 90% of the time/requires cueing < 10% of the time  Medical Problem List and Plan:  1. Left thalamic internal capsule infarct likely due to small vessel disease  2. DVT Prophylaxis/Anticoagulation: SCDs. Monitor for any signs of DVT  , has mild leg pain but no other signs of DVT  3. Pain Management: Tylenol as needed  -increase gabapentin to 200mg  BID for neuropathic pain, thalamic pain syndrome +/- diabetic neuropathy   4. Neuropsych: This patient is capable of making decisions on her own behalf.  5. Hypertension. Lisinopril 5 mg daily. bp improved for the most part  6. Diabetes mellitus with peripheral neuropathy. Hemoglobin A1c 13.1.lantus insulin titrated up  with improvement.      LOS (Days) 10 A FACE TO FACE EVALUATION WAS PERFORMED  [name] E 02/13/2013, 7:39 AM

## 2013-02-13 NOTE — Progress Notes (Signed)
Occupational Therapy Session Note  Patient Details  Name: [name] MRN: [id number] Date of Birth: 1975/05/07  Today's Date: 02/13/2013 Time: 4098-1191 Time Calculation (min): 44 min  Short Term Goals: Week 2:  OT Short Term Goal 1 (Week 2): Pt will complete LB dressing with supervision. OT Short Term Goal 2 (Week 2): Pt will complete simple meal prep with supervision with use of RUE as nondominant hand. OT Short Term Goal 3 (Week 2): STG=LTG Mod I overall for self care  Skilled Therapeutic Interventions/Progress Updates:  Pt seen 1:1 OT with focus on weight shifting, FMC with RUE, and standing balance while using BUE. Pt in bed upon arrival. Pt ambulated with RW to bathroom and completed toileting Mod I. Therapist wheeled Pt outside of gym door. Pt ambulated with RW with supervision to therapy office to open door using a key with RUE. Pt displayed some ataxic movements when finding the keyhole but was able to successfully unlock/lock door. In gym therapist engaged Pt in BUE task of hanging up laundry with clothes pins while standing promoting weight shift, side stepping, and FMC with RW for safety. Pt then folded towels and placed them into eye level cabinet. 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 Pt ambulated with RW with supervision to mirror. Therapist engaged Pt in BUE activity rolling a ball in different directions to promote weight shift and bending of knees as therapist provided contact guard for safety. Pt required VC and manual facilitation to faci bend at the knee.Therapist wheeled Pt back to room. Pt left in bed with all needs in reach.   Therapy Documentation Precautions:  Precautions Precautions: Fall Restrictions Weight Bearing Restrictions: No   Pain: Pain Assessment Pain Assessment: 0-10 Pain Score: 5  Pain Type: Acute pain Pain Location: Head Pain Orientation:  Anterior Pain Descriptors / Indicators: Headache Pain Onset: Gradual Patients Stated Pain Goal: 2 Pain Intervention(s): Medication (See eMAR) (tylenol 650 mgpo)  See FIM for current functional status  Therapy/Group: Individual Therapy  Donna Christen 02/13/2013, 2:38 PM

## 2013-02-13 NOTE — Progress Notes (Signed)
Physical Therapy Session Note  Patient Details  Name: [name] MRN: [id number] Date of Birth: March 03, 1976  Today's Date: 02/13/2013 Time: 4098-1191, 4782-9562 Time Calculation (min): 60 min, 25 min  Short Term Goals: Week 1:  PT Short Term Goal 1 (Week 1): Pt will peform bed mobility on flat bed, no rail with supervision PT Short Term Goal 1 - Progress (Week 1): Met PT Short Term Goal 2 (Week 1): Pt will perform bed <> w/c transfers with min A to L and R side PT Short Term Goal 2 - Progress (Week 1): Met PT Short Term Goal 3 (Week 1): Pt will perform gait in controlled environment x 100' min A PT Short Term Goal 3 - Progress (Week 1): Met PT Short Term Goal 4 (Week 1): Pt will negotiate 3 stairs with one rail and min A PT Short Term Goal 4 - Progress (Week 1): Progressing toward goal Week 2:  PT Short Term Goal 1 (Week 2): = LTG  Skilled Therapeutic Interventions/Progress Updates:   Session 1: Pt received supine in bed. Performed supine>sit and donned shoes mod I. Pt ambulated 150 ft to gym supervision with RW. Pt demonstrated improvement in gait quality, sequencing, and weight shifting. See details below. Performed NMR for weight shifting, trunk stability, and balance in half kneeling, standing, and standing on compliant surface. Pt required min guard for dynamic standing balance activities. See NMR details below.  Pt performed side stepping and backwards walking with HHA and manual facilitation for weight shift, hip extension, and upright trunk posture. Details below. Pt ambulated 150 ft supervision with RW to room. Pt transferred stand>sit supervision and sit>supine mod I. Pt left in bed with all needs in reach.   Session 2: Pt ambulated 200 ft supervision with RW to ADL apartment. Pt performed stand<>sit onto love seat with supervision. Pt navigated through ADL kitchen with RW and performed opening/reaching into cupboards to retrieve glasses with RUE and retrieving plates with LUE utilizing  RW for balance. Pt opened refrigerator and reached to top and middle shelves on door with supervision and to bottom crisper drawer with min guard. Pt demonstrated appropriate compensatory strategy without verbal cue utilizing lower cross bar on RW to assist with balance. Pt performed sit<>stand on armless kitchen chair supervision. Pt negotiated 5 steps with R rail with min guard for balance and demonstrated appropriate sequencing without verbal cues. Therapist propelled pt in w/c to return to room secondary to pt fatigue and pt performed sit>stand from w/c supervision and ambulated ~3 steps with HHA to bed. Pt performed stand>sit supervision and sit>supine mod I. Pt left in bed with all needs in reach.   Therapy Documentation Precautions:  Precautions Precautions: Fall Restrictions Weight Bearing Restrictions: No   Pain: Session 1: Pain Assessment: 0-10 Pain Score:0 Session 2:  Pain Type: Acute pain Pain Location: Calf Pain Orientation: Right Pain Descriptors / Indicators: Sore Pain Onset: Gradual Pain Intervention(s): Rest   Mobility: Bed Mobility Bed Mobility: Supine to Sit;Sit to Supine Supine to Sit: 6: Modified independent (Device/Increase time) Sit to Supine: 6: Modified independent (Device/Increase time) Transfers Sit to Stand: 5: Supervision Stand to Sit: 5: Supervision Locomotion : Session 1 Ambulation Ambulation: Yes Ambulation/Gait Assistance: 5: Supervision (close supervision) Ambulation Distance (Feet): 150 feet Assistive device: Rolling walker Ambulation/Gait Assistance Details: Pt demonstrates improved sequencing, weight shift onto RLE, greater initiation of knee flexion in swing phase, consistent heel strike with decreased "slapping" on R, and increased hip extension on R/anterior translation during terminal stance.  Gait Gait: Yes Gait Pattern: Step-through pattern;Decreased step length - left;Decreased stance time - right;Decreased hip/knee flexion -  right;Decreased dorsiflexion - right;Decreased weight shift to right;Right genu recurvatum High Level Ambulation High Level Ambulation: Backwards walking;Side stepping Side Stepping: Sidestepping to L and R x10 ft each with HHA and manual facilitation for upright trunk posture. Backwards Walking: Backwards walking x20 ft initially with RW and then with HHA and min assist for trunk control and tactile cues for R hip extension.   Other Treatments: Treatments Neuromuscular Facilitation: Right;Left;Upper Extremity;Lower Extremity;Forced use;Activity to increase coordination;Activity to increase motor control;Activity to increase timing and sequencing;Activity to increase lateral weight shifting;Activity to increase anterior-posterior weight shifting. Half kneeling R and L LE in front anterior/posterior weight shift x5 and UE reaching in half kneeling ipsilateral arm/leading leg for extension and trunk elongation x8. Standing cone stacking with trunk rotation with min A to min guard for balance. Standing cone stacking with trunk rotation on foam with min guard for balance. Ball throwing/catching on foam with min guard.   See FIM for current functional status  Therapy/Group: Individual Therapy  Golden Gilreath 02/13/2013, 3:49 PM

## 2013-02-13 NOTE — Progress Notes (Signed)
This note has been reviewed and this clinician agrees with information provided.  

## 2013-02-13 NOTE — Progress Notes (Signed)
Occupational Therapy Session Note  Patient Details  Name: [name] MRN: [id number] Date of Birth: 1975/04/30  Today's Date: 02/13/2013 Time: 4098-1191  Time Calculation (min): 59 min   Short Term Goals: Week 2:  OT Short Term Goal 1 (Week 2): Pt will complete LB dressing with supervision. OT Short Term Goal 2 (Week 2): Pt will complete simple meal prep with supervision with use of RUE as nondominant hand. OT Short Term Goal 3 (Week 2): STG=LTG Mod I overall for self care  Skilled Therapeutic Interventions/Progress Updates:    1.) Pt seen for ADL retraining with focus on LB dressing, performing laundry, and performing light housekeeping. Pt in bed upon arrival. Pt ambulated throughout room with RW to obtain clothing needed for dressing after shower. Pt performed bathing in walk in shower with supervision. Plan to have Pt bathe at Mod I level next session. Pt completed LB dressing by steadying herself on RW. Educated Pt on use of grab bars and dressing at sink to pull up underwear while standing for support. Engaged Pt in simulating completing laundry task. Pt ambulated using RW to retrieve towels from linen closet and placed towels in washer/dryer. Pt then folded towels using BUE while standing, supporting herself by leaning on washer. Therapist wheeled Pt to ADL apartment to complete light housekeeping task. Pt ambulated with RW through kitchen to retrieve vacuum. Pt ambulated with RW to bedroom pushing vacuum in front of her one step at a time. Pt demonstrated ambulating with RW and vacuuming with use of RUE. Pt ambulated with RW  back to kitchen place vacuum back in the closet. Pt left in room in w/c with all needs in reach.    Therapy Documentation Precautions:  Precautions Precautions: Fall Restrictions Weight Bearing Restrictions: No   Vital Signs: Therapy Vitals Temp: 98.1 F (36.7 C) Temp src: Oral Pulse Rate: 83 Resp: 19 BP: 130/70 mmHg Patient Position, if appropriate:  Lying Oxygen Therapy SpO2: 99 % O2 Device: None (Room air)  See FIM for current functional status  Therapy/Group: Individual Therapy  Donna Christen 02/13/2013, 9:01 AM

## 2013-02-13 NOTE — Progress Notes (Signed)
I have reviewed and agree with the treatment note.   

## 2013-02-13 NOTE — Plan of Care (Signed)
Problem: RH KNOWLEDGE DEFICIT Goal: RH STG INCREASE KNOWLEDGE OF DIABETES Patient will demonstrate understanding of diabetes and importance of checking and managing blood sugars with mod assist.  Outcome: Progressing Interpretor assisted in reviewing medications and insulin injections .  Goal: RH STG INCREASE KNOWLEDGE OF HYPERTENSION Patient will demonstrate and verbalize understanding of HTN, importance of consistently taking medications, checking blood pressure with mod assist.  Outcome: Progressing Assisted by interpretor for education

## 2013-02-14 ENCOUNTER — Inpatient Hospital Stay (HOSPITAL_COMMUNITY): Payer: BC Managed Care – PPO | Admitting: Physical Therapy

## 2013-02-14 DIAGNOSIS — E119 Type 2 diabetes mellitus without complications: Secondary | ICD-10-CM

## 2013-02-14 DIAGNOSIS — I6350 Cerebral infarction due to unspecified occlusion or stenosis of unspecified cerebral artery: Secondary | ICD-10-CM

## 2013-02-14 DIAGNOSIS — I10 Essential (primary) hypertension: Secondary | ICD-10-CM

## 2013-02-14 LAB — GLUCOSE, CAPILLARY
Glucose-Capillary: 114 mg/dL — ABNORMAL HIGH (ref 70–99)
Glucose-Capillary: 120 mg/dL — ABNORMAL HIGH (ref 70–99)
Glucose-Capillary: 96 mg/dL (ref 70–99)
Glucose-Capillary: 156 mg/dL — ABNORMAL HIGH (ref 70–99)

## 2013-02-14 NOTE — Progress Notes (Signed)
Physical Therapy Session Note  Patient Details  Name: [name] MRN: [id number] Date of Birth: 03-03-1976  Today's Date: 02/14/2013 Time: 1505-1600 Time Calculation (min): 55 min  Short Term Goals: Week 2:  PT Short Term Goal 1 (Week 2): = LTG  Skilled Therapeutic Interventions/Progress Updates:    Patient received semi-reclined in bed; reports no pain and is agreeable to session. Session focused on car transfer, stair negotiation, and ambulation/transfers in home and community setting. Supine<>sit with no assist, HOB flat, no bed rails. Gait x150' with rolling walker in controlled setting requiring supervision. Car transfer with rolling walker and supervision. Pt transported in w/c to outdoor setting, where pt ambulated x150' over concrete/stone surface with rolling walker requiring supervision. Pt transported in w/c to apartment, where she performed multiple stand-pivot transfers from sofa<>w/c<>bed with rolling walker requiring supervision. Gait x25' with rolling walker in home setting requiring supervision. Negotiation of 5 stairs with single hand rail requiring min guard. Pt transported in w/c to room, where session ended. Therapist departed with pt supine in bed with bed alarm on and all needs within reach. Therapy Documentation Precautions:  Precautions Precautions: Fall Restrictions Weight Bearing Restrictions: No Vital Signs: Therapy Vitals Temp: 97.9 F (36.6 C) Temp src: Oral Pulse Rate: 89 Resp: 18 BP: 113/75 mmHg Patient Position, if appropriate: Sitting Oxygen Therapy SpO2: 99 % O2 Device: None (Room air) Pain: Pain Assessment Pain Assessment: No/denies pain Pain Score: 0-No pain Locomotion : Ambulation Ambulation/Gait Assistance: 5: Supervision See FIM for current functional status  Therapy/Group: Individual Therapy  Naje Rice, Lorenda Ishihara 02/14/2013, 4:15 PM

## 2013-02-14 NOTE — Progress Notes (Signed)
Patient ID: Tatianna Ibbotson, female   DOB: November 15, 1975, 37 y.o.   MRN: [id number] Subjective/Complaints:  11/8. 37 y.o. non-english speaking female with history of DM, HTN, who was admitted on 01/30/13 with right sided weakness and numbness. Last seen normal night---SBP 200 and CBG 307 at admission. MRI brain with acute nonhemorrhagic linear infarcts involving the lateral left thalamus and potentially internal capsule and MRA without significant stenosis. 2 D echo with EF 60-65% and no wall abnormalities. Carotid dopplers without significant ICA stenosis. Neurology recommends ASA 81 mg/daily for secondary stroke prevention as well as risk factor modification. No pain complaints. Still has some numbness on the right side.  Review of Systems - Negative except right side weakness  Objective: Vital Signs: Blood pressure 109/65, pulse 86, temperature 98.2 F (36.8 C), temperature source Oral, resp. rate 18, height 4\' 11"  (1.499 m), weight 153 lb (69.4 kg), last menstrual period 02/03/2013, SpO2 98.00%. No results found. Results for orders placed during the hospital encounter of 02/03/13 (from the past 72 hour(s))  GLUCOSE, CAPILLARY     Status: Abnormal   Collection Time    02/11/13 11:39 AM      Result Value Range   Glucose-Capillary 118 (*) 70 - 99 mg/dL  GLUCOSE, CAPILLARY     Status: Abnormal   Collection Time    02/11/13  4:47 PM      Result Value Range   Glucose-Capillary 104 (*) 70 - 99 mg/dL   Comment 1 Notify RN    GLUCOSE, CAPILLARY     Status: None   Collection Time    02/11/13  9:30 PM      Result Value Range   Glucose-Capillary 87  70 - 99 mg/dL   Comment 1 Notify RN    GLUCOSE, CAPILLARY     Status: Abnormal   Collection Time    02/12/13  7:27 AM      Result Value Range   Glucose-Capillary 107 (*) 70 - 99 mg/dL   Comment 1 Notify RN    GLUCOSE, CAPILLARY     Status: Abnormal   Collection Time    02/12/13 12:15 PM      Result Value Range   Glucose-Capillary 149 (*) 70 - 99  mg/dL   Comment 1 Notify RN    GLUCOSE, CAPILLARY     Status: None   Collection Time    02/12/13  4:44 PM      Result Value Range   Glucose-Capillary 98  70 - 99 mg/dL   Comment 1 Notify RN    GLUCOSE, CAPILLARY     Status: None   Collection Time    02/12/13  9:31 PM      Result Value Range   Glucose-Capillary 99  70 - 99 mg/dL   Comment 1 Notify RN    GLUCOSE, CAPILLARY     Status: Abnormal   Collection Time    02/13/13  7:27 AM      Result Value Range   Glucose-Capillary 119 (*) 70 - 99 mg/dL  GLUCOSE, CAPILLARY     Status: Abnormal   Collection Time    02/13/13 11:26 AM      Result Value Range   Glucose-Capillary 159 (*) 70 - 99 mg/dL  GLUCOSE, CAPILLARY     Status: Abnormal   Collection Time    02/13/13  5:13 PM      Result Value Range   Glucose-Capillary 104 (*) 70 - 99 mg/dL  GLUCOSE, CAPILLARY     Status:  Abnormal   Collection Time    02/13/13  8:22 PM      Result Value Range   Glucose-Capillary 119 (*) 70 - 99 mg/dL   Comment 1 Notify RN    GLUCOSE, CAPILLARY     Status: Abnormal   Collection Time    02/14/13  7:26 AM      Result Value Range   Glucose-Capillary 114 (*) 70 - 99 mg/dL    Patient Vitals for the past 24 hrs:  BP Temp Temp src Pulse Resp SpO2  02/14/13 0517 109/65 mmHg 98.2 F (36.8 C) Oral 86 18 98 %  02/13/13 1549 104/67 mmHg 97.4 F (36.3 C) Oral 87 20 100 %    Physical Exam  Constitutional: She is oriented to person, place, and time. She appears well-developed and well-nourished.  Sitting by side of bed using I phone HENT:  Head: Normocephalic and atraumatic.  Eyes: Conjunctivae and EOM are normal. Pupils are equal, round, and reactive to light.  Neck: No tracheal deviation present. No thyromegaly present.  Chest- clear Cardiovascular: Normal rate. Regular rhythm.  Neurological: She is alert and oriented to person, place, and time. . Good sitting balance. Speech generally clear.  RUE 3+ to 4 deltoid, bicep, tricep, wrist, HI, RLE 3+HF,  4-KE, 4 ADF/APF. Sensory 1 to 1+/2 right face, arm, leg.  Psychiatric:  Mood and affect  appropriate.     Assessment/Plan: 1. Functional deficits secondary to right hemiparesis secondary to left thalamic and left internal capsule lacunar infarcts which require 3+ hours per day of interdisciplinary therapy in a comprehensive inpatient rehab setting.  2. Hypertension. Lisinopril 5 mg daily. bp improved  3.  Diabetes mellitus with peripheral neuropathy. Hemoglobin A1c 13.1.lantus insulin titrated up  with improvement.  Presently well controlled   LOS (Days) 11 A FACE TO FACE EVALUATION WAS PERFORMED  Rogelia Boga 02/14/2013, 8:30 AM

## 2013-02-15 ENCOUNTER — Inpatient Hospital Stay (HOSPITAL_COMMUNITY): Payer: BC Managed Care – PPO | Admitting: Physical Therapy

## 2013-02-15 LAB — GLUCOSE, CAPILLARY
Glucose-Capillary: 130 mg/dL — ABNORMAL HIGH (ref 70–99)
Glucose-Capillary: 137 mg/dL — ABNORMAL HIGH (ref 70–99)
Glucose-Capillary: 127 mg/dL — ABNORMAL HIGH (ref 70–99)
Glucose-Capillary: 96 mg/dL (ref 70–99)

## 2013-02-15 NOTE — Progress Notes (Signed)
Patient ID: [name], female   DOB: 17-Dec-1975, 37 y.o.   MRN: [id number] Patient ID: [name], female   DOB: [birth year], 37 y.o.   MRN: [id number] Subjective/Complaints:  11/9.  37 y.o. non-english speaking female with history of DM, HTN, who was admitted on 01/30/13 with right sided weakness and numbness. Last seen normal night---SBP 200 and CBG 307 at admission. MRI brain with acute nonhemorrhagic linear infarcts involving the lateral left thalamus and potentially internal capsule and MRA without significant stenosis. 2 D echo with EF 60-65% and no wall abnormalities. Carotid dopplers without significant ICA stenosis. Neurology recommends ASA 81 mg/daily for secondary stroke prevention as well as risk factor modification. No pain complaints. Still has some numbness on the right side.  Comfortable; feeding self breakfast.  Diabetes and HTN  Remain  nicely controlled  Objective: Vital Signs: Blood pressure 113/72, pulse 87, temperature 98.2 F (36.8 C), temperature source Oral, resp. rate 18, height 4\' 11"  (1.499 m), weight 153 lb (69.4 kg), last menstrual period 02/03/2013, SpO2 99.00%. No results found. Results for orders placed during the hospital encounter of 02/03/13 (from the past 72 hour(s))  GLUCOSE, CAPILLARY     Status: Abnormal   Collection Time    02/12/13 12:15 PM      Result Value Range   Glucose-Capillary 149 (*) 70 - 99 mg/dL   Comment 1 Notify RN    GLUCOSE, CAPILLARY     Status: None   Collection Time    02/12/13  4:44 PM      Result Value Range   Glucose-Capillary 98  70 - 99 mg/dL   Comment 1 Notify RN    GLUCOSE, CAPILLARY     Status: None   Collection Time    02/12/13  9:31 PM      Result Value Range   Glucose-Capillary 99  70 - 99 mg/dL   Comment 1 Notify RN    GLUCOSE, CAPILLARY     Status: Abnormal   Collection Time    02/13/13  7:27 AM      Result Value Range   Glucose-Capillary 119 (*) 70 - 99 mg/dL  GLUCOSE, CAPILLARY     Status: Abnormal   Collection  Time    02/13/13 11:26 AM      Result Value Range   Glucose-Capillary 159 (*) 70 - 99 mg/dL  GLUCOSE, CAPILLARY     Status: Abnormal   Collection Time    02/13/13  5:13 PM      Result Value Range   Glucose-Capillary 104 (*) 70 - 99 mg/dL  GLUCOSE, CAPILLARY     Status: Abnormal   Collection Time    02/13/13  8:22 PM      Result Value Range   Glucose-Capillary 119 (*) 70 - 99 mg/dL   Comment 1 Notify RN    GLUCOSE, CAPILLARY     Status: Abnormal   Collection Time    02/14/13  7:26 AM      Result Value Range   Glucose-Capillary 114 (*) 70 - 99 mg/dL  GLUCOSE, CAPILLARY     Status: Abnormal   Collection Time    02/14/13 11:45 AM      Result Value Range   Glucose-Capillary 120 (*) 70 - 99 mg/dL  GLUCOSE, CAPILLARY     Status: None   Collection Time    02/14/13  4:46 PM      Result Value Range   Glucose-Capillary 96  70 - 99 mg/dL  GLUCOSE, CAPILLARY  Status: Abnormal   Collection Time    02/14/13  8:02 PM      Result Value Range   Glucose-Capillary 156 (*) 70 - 99 mg/dL  GLUCOSE, CAPILLARY     Status: Abnormal   Collection Time    02/15/13  7:34 AM      Result Value Range   Glucose-Capillary 130 (*) 70 - 99 mg/dL   Comment 1 Notify RN      Patient Vitals for the past 24 hrs:  BP Temp Temp src Pulse Resp SpO2  02/15/13 0511 113/72 mmHg 98.2 F (36.8 C) Oral 87 18 99 %  02/14/13 1527 113/75 mmHg 97.9 F (36.6 C) Oral 89 18 99 %    Physical Exam  Constitutional: She is oriented to person, place, and time. She appears well-developed and well-nourished.  Sitting by side of bed using I phone HENT:  Head: Normocephalic and atraumatic.  Eyes: Conjunctivae and EOM are normal. Pupils are equal, round, and reactive to light.  Neck: No tracheal deviation present. No thyromegaly present.  Chest- clear Cardiovascular: Normal rate. Regular rhythm.  Neurological: She is alert and oriented to person, place, and time. . Good sitting balance. Speech generally clear.  RUE 3+ to  4 deltoid, bicep, tricep, wrist, HI, RLE 3+HF, 4-KE, 4 ADF/APF. Sensory 1 to 1+/2 right face, arm, leg.  Psychiatric:  Mood and affect  appropriate.     Assessment/Plan: 1. Functional deficits secondary to right hemiparesis secondary to left thalamic and left internal capsule lacunar infarcts which require 3+ hours per day of interdisciplinary therapy in a comprehensive inpatient rehab setting.  2. Hypertension. Lisinopril 5 mg daily. bp well controlled 3.  Diabetes mellitus with peripheral neuropathy. Hemoglobin A1c 13.1.lantus insulin titrated up  with improvement.  Presently well controlled   LOS (Days) 12 A FACE TO FACE EVALUATION WAS PERFORMED  Rogelia Boga 02/15/2013, 8:11 AM

## 2013-02-15 NOTE — Progress Notes (Signed)
Physical Therapy Session Note  Patient Details  Name: [name] MRN: [id number] Date of Birth: 12-Mar-1976  Today's Date: 02/15/2013 Time: 4401-0272 Time Calculation (min): 41 min  Short Term Goals: Week 1:  PT Short Term Goal 1 (Week 1): Pt will peform bed mobility on flat bed, no rail with supervision PT Short Term Goal 1 - Progress (Week 1): Met PT Short Term Goal 2 (Week 1): Pt will perform bed <> w/c transfers with min A to L and R side PT Short Term Goal 2 - Progress (Week 1): Met PT Short Term Goal 3 (Week 1): Pt will perform gait in controlled environment x 100' min A PT Short Term Goal 3 - Progress (Week 1): Met PT Short Term Goal 4 (Week 1): Pt will negotiate 3 stairs with one rail and min A PT Short Term Goal 4 - Progress (Week 1): Progressing toward goal  Skilled Therapeutic Interventions/Progress Updates:  Pt was seen bedside in the am. Pt transferred supine to edge of bed, edge of bed to supine, sit to stand, stand to sit with S. Pt ambulated with rolling walker for distances of 150, 80 and 150 feet with S and verbal cues for technique. While in gym, treatment focused on LE strengthening, weight shifting and unilateral stance.   Therapy Documentation Precautions:  Precautions Precautions: Fall Restrictions Weight Bearing Restrictions: No General:   Vital Signs:   Pain: Pain Assessment Pain Score: 0-No pain Mobility:   Locomotion : Ambulation Ambulation/Gait Assistance: 5: Supervision   See FIM for current functional status  Therapy/Group: Individual Therapy  Rayford Halsted 02/15/2013, 11:29 AM

## 2013-02-15 NOTE — Plan of Care (Signed)
Problem: RH SAFETY Goal: RH STG ADHERE TO SAFETY PRECAUTIONS W/ASSISTANCE/DEVICE STG Adhere to Safety Precautions With min Assistance/Device.  Outcome: Not Progressing Pt found returning from bathroom back to bed without assist.

## 2013-02-16 ENCOUNTER — Inpatient Hospital Stay (HOSPITAL_COMMUNITY): Payer: BC Managed Care – PPO

## 2013-02-16 ENCOUNTER — Inpatient Hospital Stay (HOSPITAL_COMMUNITY): Payer: BC Managed Care – PPO | Admitting: Physical Therapy

## 2013-02-16 ENCOUNTER — Encounter (HOSPITAL_COMMUNITY): Payer: BC Managed Care – PPO

## 2013-02-16 LAB — GLUCOSE, CAPILLARY
Glucose-Capillary: 100 mg/dL — ABNORMAL HIGH (ref 70–99)
Glucose-Capillary: 99 mg/dL (ref 70–99)

## 2013-02-16 MED ORDER — LISINOPRIL 5 MG PO TABS: 5.0000 mg | ORAL_TABLET | Freq: Every day | ORAL | Status: DC

## 2013-02-16 MED ORDER — INSULIN GLARGINE 100 UNIT/ML ~~LOC~~ SOLN: 15.0000 [IU] | Freq: Every day | SUBCUTANEOUS | Status: DC

## 2013-02-16 MED ORDER — ATORVASTATIN CALCIUM 40 MG PO TABS: 40.0000 mg | ORAL_TABLET | Freq: Every day | ORAL | Status: DC

## 2013-02-16 MED ORDER — GABAPENTIN 100 MG PO CAPS: 200.0000 mg | ORAL_CAPSULE | Freq: Three times a day (TID) | ORAL | Status: DC

## 2013-02-16 MED ORDER — ASPIRIN 81 MG PO TBEC: 81.0000 mg | DELAYED_RELEASE_TABLET | Freq: Every day | ORAL | Status: DC

## 2013-02-16 NOTE — Discharge Summary (Signed)
Discharge summary job [id number]

## 2013-02-16 NOTE — Progress Notes (Signed)
Physical Therapy Discharge Summary  Patient Details  Name: [name] MRN: [id number] Date of Birth: 1976/02/22  Today's Date: 02/16/2013 Time: 0907-1001, 7846-9629 Time Calculation (min): 54 min, 16 min  Patient has met 10 of 10 long term goals due to improved balance, improved postural control, increased strength, ability to compensate for deficits, functional use of  right upper extremity and right lower extremity and improved coordination.  Patient to discharge at an ambulatory level Supervision; mod I for household ambulation, bed mobility,basic transfers.   Patient's care partner is independent to provide the necessary supervision assistance at discharge.  Reasons goals not met: All goals met.  Recommendation:  Patient will benefit from ongoing skilled PT services in outpatient setting to continue to advance safe functional mobility, address ongoing impairments in RUE and RLE strength, balance, coordination, gait, and minimize fall risk.  Equipment: rolling walker, R foot up brace  Reasons for discharge: treatment goals met and discharge from hospital  Patient/family agrees with progress made and goals achieved: Yes  PT Discharge Precautions/Restrictions Restrictions Weight Bearing Restrictions: No   Pain Pain Assessment Pain Assessment: No/denies pain Pt denied pain at beginning of session but reported hip pain when performing floor transfer. Pt repositioned and had no further complaints of pain.   Skilled therapeutic interventions/Progress updates: Session 1: Received pt lying in bed. Pt performed supine>sit, donned shoes and performed sit>stand mod I. Pt bent down from standing to adjust shoelace and returned to standing with min guard. Pt ambulated 150 ft supervision with RW. Completed PASS with score of 29/36 improved from 24/36 at eval. Reassessed strength, sensation, coordination; see details below. Pt negotiated 12 steps with R rail and min guard with verbal cues for  pattern and for hip/knee flexion to clear step on R. Pt placed both UE on R rail but ascending/descending stairs facing forward. Pt required rest break after ascending stairs. Pt performed floor transfer with supervision. Performed dynamic standing balance focusing on shifting weight to R side and maintaining balance without UE support during reaching activities/reaching across midline. Pt ambulated 150 ft supervision with RW to room and transferred stand>sit and sit>supine mod I. Pt left in bed with all needs in reach.   Session 2: Pt in bathroom upon arrival. Pt brother "[name]" present for family education. Pt with R foot up brace donned. Pt ambulated from bathroom with RW to sink to wash hands mod I. Pt ambulated 200 ft supervision with RW to rehab car. Discussed with brother safety considerations if pt were to fall and brother verbalized understanding. Brother confirmed pt son would be able to call EMS if necessary. Pt demonstrated improved consistent heel strike with use of foot up brace. Pt performed car transfer supervision. Instructed brother how to fold RW for transport in car and he verbalized understanding. Pt negotiated 3 steps with supervision sideways with both UE on R rail. Pt and brother verbalized understanding for technique and placement of walker at top of steps. Pt ambulated 150 ft supervision with RW to room. Pt performed stand>sit mod I. Pt left seated EOB.   Vision/Perception  Perception Perception: Within Functional Limits Praxis Praxis: Impaired Praxis Impairment Details: Motor planning  Cognition Overall Cognitive Status: Within Functional Limits for tasks assessed Arousal/Alertness: Awake/alert Awareness: Appears intact Safety/Judgment: Appears intact Sensation Sensation Light Touch: Impaired Detail Light Touch Impaired Details: Impaired RUE;Impaired RLE Stereognosis: Not tested Hot/Cold: Not tested Proprioception: Impaired by gross assessment Additional Comments: Pt  able to identify light touch accurately but reports numbness  in  R UE and LE Coordination Gross Motor Movements are Fluid and Coordinated: No Fine Motor Movements are Fluid and Coordinated: Not tested Coordination and Movement Description: decreased fluidity and impaired timing and sequencing of muscle activation Finger Nose Finger Test: dysmetria RUE, decreased fluidity and impaired timing and sequencing of muscle activation (Significantly improved from eval) Heel Shin Test: Decreased fluidity and delayed sequencing (Improved fluidity from eval) Motor  Motor Motor: Hemiplegia;Abnormal postural alignment and control;Motor impersistence Motor - Discharge Observations: R hemiplegia, impaired motor control, delayed motor activation, impaired sequencing/timing, impaired postural control in standing without UE support  Mobility Bed Mobility Bed Mobility: Rolling Right;Rolling Left;Supine to Sit;Sitting - Scoot to [place] of Bed;Sit to Supine Rolling Right: 6: Modified independent (Device/Increase time) Rolling Left: 6: Modified independent (Device/Increase time) Supine to Sit: 6: Modified independent (Device/Increase time) Sitting - Scoot to Edge of Bed: 6: Modified independent (Device/Increase time) Sit to Supine: 6: Modified independent (Device/Increase time) Transfers Transfers: Yes Sit to Stand: 6: Modified independent (Device/Increase time) Stand to Sit: 6: Modified independent (Device/Increase time) Locomotion  Ambulation Ambulation: Yes Ambulation/Gait Assistance: 5: Supervision Ambulation Distance (Feet): 150 Feet Assistive device: Rolling walker Ambulation/Gait Assistance Details: Pt demonstrates improved forward momentum with RW, more consistent heel strike on R, decreased forward trunk lean/excessive weight bearing through UE, improved weight shift to R and consistent step through gait pattern Gait Gait: Yes Gait Pattern: Impaired Gait Pattern: Step-through pattern;Decreased  stance time - right;Decreased hip/knee flexion - right;Decreased dorsiflexion - right;Decreased weight shift to right;Right genu recurvatum;Lateral trunk lean to left Stairs / Additional Locomotion Stairs: Yes Stairs Assistance: 4: Min guard Stairs Assistance Details: Verbal cues for gait pattern;Verbal cues for technique Stairs Assistance Details (indicate cue type and reason): VC for R hip/knee flexion for step clearance, VC for appropriate pattern Stair Management Technique: One rail Right;Step to pattern;Forwards (Forward ascent/descent with BUE on R rail) Number of Stairs: 12 Height of Stairs: 6 Wheelchair Mobility Wheelchair Mobility: No  Trunk/Postural Assessment  Cervical Assessment Cervical Assessment: Within Functional Limits Thoracic Assessment Thoracic Assessment: Within Functional Limits Lumbar Assessment Lumbar Assessment: Within Functional Limits Postural Control Postural Control: Deficits on evaluation  Balance Balance Balance Assessed: Yes Standardized Balance Assessment Standardized Balance Assessment: PASS (29/36 (improved from 24/36 at eval, clinically significant difference)) Static Sitting Balance Static Sitting - Balance Support: Feet supported Static Sitting - Level of Assistance: 6: Modified independent (Device/Increase time) Static Standing Balance Static Standing - Balance Support: No upper extremity supported Static Standing - Level of Assistance: 5: Stand by assistance Static Standing - Comment/# of Minutes: Asymmetrical weight bearing in standing (L lean) Dynamic Standing Balance Dynamic Standing - Balance Support: No upper extremity supported, BUE support Dynamic Standing - Level of Assistance: 5: Stand by assistance (intermittent min A for balance during weight shift to R without UE support) 6: Mod I with BUE support Dynamic Standing - Balance Activities: Lateral lean/weight shifting;Reaching for objects;Reaching across midline Extremity Assessment   RUE Assessment RUE Assessment: Exceptions to [facility] RUE Strength RUE Overall Strength Comments: 4-/5 globally  Gross Grasp: Impaired (Improved from eval) LUE Assessment LUE Assessment: Within Functional Limits RLE Assessment RLE Assessment: Exceptions to [facility] RLE Strength RLE Overall Strength: Deficits RLE Overall Strength Comments: hip flexion 3+/5, 4/5 knee flexion, ankle DF, 5/5 quad LLE Assessment LLE Assessment: Within Functional Limits  See FIM for current functional status  Tameron Lama, [facility] 02/16/2013, 12:01 PM

## 2013-02-16 NOTE — Progress Notes (Signed)
Occupational Therapy Session Note  Patient Details  Name: Jaye Saal MRN: [id number] Date of Birth: 11-12-1975  Today's Date: 02/16/2013 Time: 7829-5621 Time Calculation (Min): 16 min    Short Term Goals: Week 2:  OT Short Term Goal 1 (Week 2): Pt will complete LB dressing with supervision. OT Short Term Goal 2 (Week 2): Pt will complete simple meal prep with supervision with use of RUE as nondominant hand. OT Short Term Goal 3 (Week 2): STG=LTG Mod I overall for self care  Skilled Therapeutic Interventions/Progress Updates:    Pt seen for ADL retraining with focus on bathing and dressing. Pt completed bathing and dressing at Mod I level and displayed proper safety awareness throughout session.   Therapy Documentation Precautions:  Precautions Precautions: None Restrictions Weight Bearing Restrictions: No   Vital Signs: Therapy Vitals Temp: 98.3 F (36.8 C) Temp src: Oral Pulse Rate: 84 Resp: 18 BP: 123/71 mmHg Patient Position, if appropriate: Lying Oxygen Therapy SpO2: 100 %  See FIM for current functional status  Therapy/Group: Individual Therapy  Donna Christen 02/16/2013, 7:55 AM

## 2013-02-16 NOTE — Progress Notes (Signed)
This note has been reviewed and this clinician agrees with the information provided.

## 2013-02-16 NOTE — Progress Notes (Signed)
Subjective/Complaints: 37 y.o. non-english speaking female with history of DM, HTN, who was admitted on 01/30/13 with right sided weakness and numbness. Last seen normal night---SBP 200 and CBG 307 at admission. MRI brain with acute nonhemorrhagic linear infarcts involving the lateral left thalamus and potentially internal capsule and MRA without significant stenosis. 2 D echo with EF 60-65% and no wall abnormalities. Carotid dopplers without significant ICA stenosis. Neurology recommends ASA 81 mg/daily for secondary stroke prevention as well as risk factor modification. Patient continues with right sided weakness with RLE instability poor weight shifting as well as decreased in coordination and blurred vision L>R.  No pain complaints. Not aware of D/C  Review of Systems - Negative except right side weakness  Objective: Vital Signs: Blood pressure 123/71, pulse 84, temperature 98.3 F (36.8 C), temperature source Oral, resp. rate 18, height 4\' 11"  (1.499 m), weight 69.4 kg (153 lb), last menstrual period 02/03/2013, SpO2 100.00%. No results found. Results for orders placed during the hospital encounter of 02/03/13 (from the past 72 hour(s))  GLUCOSE, CAPILLARY     Status: Abnormal   Collection Time    02/13/13 11:26 AM      Result Value Range   Glucose-Capillary 159 (*) 70 - 99 mg/dL  GLUCOSE, CAPILLARY     Status: Abnormal   Collection Time    02/13/13  5:13 PM      Result Value Range   Glucose-Capillary 104 (*) 70 - 99 mg/dL  GLUCOSE, CAPILLARY     Status: Abnormal   Collection Time    02/13/13  8:22 PM      Result Value Range   Glucose-Capillary 119 (*) 70 - 99 mg/dL   Comment 1 Notify RN    GLUCOSE, CAPILLARY     Status: Abnormal   Collection Time    02/14/13  7:26 AM      Result Value Range   Glucose-Capillary 114 (*) 70 - 99 mg/dL  GLUCOSE, CAPILLARY     Status: Abnormal   Collection Time    02/14/13 11:45 AM      Result Value Range   Glucose-Capillary 120 (*) 70 - 99  mg/dL  GLUCOSE, CAPILLARY     Status: None   Collection Time    02/14/13  4:46 PM      Result Value Range   Glucose-Capillary 96  70 - 99 mg/dL  GLUCOSE, CAPILLARY     Status: Abnormal   Collection Time    02/14/13  8:02 PM      Result Value Range   Glucose-Capillary 156 (*) 70 - 99 mg/dL  GLUCOSE, CAPILLARY     Status: Abnormal   Collection Time    02/15/13  7:34 AM      Result Value Range   Glucose-Capillary 130 (*) 70 - 99 mg/dL   Comment 1 Notify RN    GLUCOSE, CAPILLARY     Status: Abnormal   Collection Time    02/15/13 11:30 AM      Result Value Range   Glucose-Capillary 137 (*) 70 - 99 mg/dL   Comment 1 Notify RN    GLUCOSE, CAPILLARY     Status: Abnormal   Collection Time    02/15/13  3:43 PM      Result Value Range   Glucose-Capillary 127 (*) 70 - 99 mg/dL   Comment 1 Notify RN    GLUCOSE, CAPILLARY     Status: None   Collection Time    02/15/13  8:06 PM  Result Value Range   Glucose-Capillary 96  70 - 99 mg/dL  GLUCOSE, CAPILLARY     Status: Abnormal   Collection Time    02/16/13  7:13 AM      Result Value Range   Glucose-Capillary 100 (*) 70 - 99 mg/dL      Physical Exam  Constitutional: She is oriented to person, place, and time. She appears well-developed and well-nourished.  HENT:  Head: Normocephalic and atraumatic.  Eyes: Conjunctivae and EOM are normal. Pupils are equal, round, and reactive to light.  Neck: No tracheal deviation present. No thyromegaly present.  Cardiovascular: Normal rate. Regular rhythm. Right leg with mild ant tibial tenderness, no edema. Homans sign-  Respiratory: Effort normal.  Neurological: She is alert and oriented to person, place, and time. Has good insight and awareness. Answered all basic questions relatively appropriate despite language issues. Good sitting balance. Speech generally clear.  RUE 3+ to 4 deltoid, bicep, tricep, wrist, HI, RLE 3+HF, 4-KE, 4 ADF/APF. Sensory 1 to 1+/2 right face, arm, leg.   Psychiatric:  Mood and affect  appropriate.  Musc: left thigh/hip flexors sore with touch. No swelling or warmth.    Assessment/Plan: 1. Functional deficits secondary to right hemiparesis secondary to left thalamic and left internal capsule lacunar infarcts which require 3+ hours per day of interdisciplinary therapy in a comprehensive inpatient rehab setting. Physiatrist is providing close team supervision and 24 hour management of active medical problems listed below. Physiatrist and rehab team continue to assess barriers to discharge/monitor patient progress toward functional and medical goals. Stable for D/C today F/u PCP in 1-2 weeks F/u PM&R 3 weeks See D/C summary See D/C instructions FIM: FIM - Bathing Bathing Steps Patient Completed: Chest;Right upper leg;Right Arm;Left upper leg;Left Arm;Right lower leg (including foot);Abdomen;Left lower leg (including foot);Front perineal area;Buttocks Bathing: 6: Assistive device (Comment)  FIM - Upper Body Dressing/Undressing Upper body dressing/undressing steps patient completed: Thread/unthread left sleeve of pullover shirt/dress;Put head through opening of pull over shirt/dress;Pull shirt over trunk;Thread/unthread right sleeve of pullover shirt/dresss Upper body dressing/undressing: 7: Complete Independence: No helper FIM - Lower Body Dressing/Undressing Lower body dressing/undressing steps patient completed: Thread/unthread right underwear leg;Thread/unthread left underwear leg;Pull underwear up/down;Don/Doff right sock;Don/Doff left sock;Don/Doff right shoe;Don/Doff left shoe Lower body dressing/undressing: 6: Assistive device (Comment)  FIM - Toileting Toileting steps completed by patient: Adjust clothing prior to toileting;Performs perineal hygiene;Adjust clothing after toileting Toileting Assistive Devices: Grab bar or rail for support Toileting: 5: Supervision: Safety issues/verbal cues  FIM - Scientist, research (physical sciences) Devices: Elevated toilet seat;Grab bars;Walker Toilet Transfers: 5-To toilet/BSC: Supervision (verbal cues/safety issues);5-From toilet/BSC: Supervision (verbal cues/safety issues)  FIM - Banker Devices: Walker;Arm rests Bed/Chair Transfer: 7: Supine > Sit: No assist;6: Bed > Chair or W/C: No assist;6: Chair or W/C > Bed: No assist  FIM - Locomotion: Wheelchair Distance: 150 Locomotion: Wheelchair: 0: Activity did not occur FIM - Locomotion: Ambulation Locomotion: Ambulation Assistive Devices: Designer, industrial/product Ambulation/Gait Assistance: 5: Supervision Locomotion: Ambulation: 5: Travels 150 ft or more with supervision/safety issues  Comprehension Comprehension Mode: Auditory Comprehension: 5-Follows basic conversation/direction: With extra time/assistive device  Expression Expression Mode: Verbal Expression: 5-Expresses basic needs/ideas: With no assist  Social Interaction Social Interaction: 6-Interacts appropriately with others with medication or extra time (anti-anxiety, antidepressant).  Problem Solving Problem Solving: 5-Solves basic problems: With no assist  Memory Memory: 5-Recognizes or recalls 90% of the time/requires cueing < 10% of the time  Medical Problem List and  Plan:  1. Left thalamic internal capsule infarct likely due to small vessel disease  2. DVT Prophylaxis/Anticoagulation: SCDs. Monitor for any signs of DVT  , has mild leg pain but no other signs of DVT  3. Pain Management: Tylenol as needed  -cont gabapentin to 200mg  BID for neuropathic pain, thalamic pain syndrome +/- diabetic neuropathy   4. Neuropsych: This patient is capable of making decisions on her own behalf.  5. Hypertension. Lisinopril 5 mg daily. bp improved for the most part  6. Diabetes mellitus with peripheral neuropathy. Hemoglobin A1c 13.1.lantus insulin titrated up  with improvement.      LOS (Days) 13 A FACE TO FACE EVALUATION WAS  PERFORMED  Robbi Scurlock E 02/16/2013, 8:21 AM

## 2013-02-16 NOTE — Progress Notes (Signed)
Occupational Therapy Discharge Summary  Patient Details  Name: [name] MRN: [id number] Date of Birth: [birth year]  Today's Date: 02/16/2013 Time: 8295-6213 Time Calculation (min): 20 min  Patient has met 12 of 12 long term goals due to improved activity tolerance, improved balance, postural control, ability to compensate for deficits, functional use of  LEFT upper and LEFT lower extremity and improved coordination.  Patient to discharge at overall Modified Independent level.  Patient's care partner is not necessary  to provide the necessary physical and cognitive assistance at discharge due to patient discharging at Mod I level. Family education completed on day of discharge with patient's brother who verbalized understanding of patient's CLOF. Discussed patient will need supervision with home management tasks at home and provided several reasons why and he verbalized understanding of this.    Reasons goals not met: N/A  Recommendation:  Patient will benefit from ongoing skilled OT services in outpatient setting to continue to advance functional skills in the area of BADL and iADL.  Equipment: TTB  Reasons for discharge: treatment goals met and discharge from hospital  Patient/family agrees with progress made and goals achieved: Yes  Skilled Therapeutic Intervention: Therapist educated Pt and Pt's brother on need for supervision with homemaking tasks such as cooking, cleaning, and laundry for safety. Therapist reccommended grab bars and non skid mat for shower at home for safety. Pt performed tub bench transfer with use of RW.  Both Pt and brother verbalized understanding and had no further questions at this time. Therapist also educated Pt on use of theraputty to strengthen the Right hand. Pt verbalized understanding.    OT Discharge Precautions/Restrictions  Precautions Precautions: None Restrictions Weight Bearing Restrictions: No  Vision/Perception  Vision - History Baseline  Vision: No visual deficits Patient Visual Report: No change from baseline Vision - Assessment Eye Alignment: Within Functional Limits Vision Assessment: Vision tested Ocular Range of Motion: Within Functional Limits;Other (comment) Tracking/Visual Pursuits: Decreased smoothness of horizontal tracking  Cognition Overall Cognitive Status: Within Functional Limits for tasks assessed Arousal/Alertness: Awake/alert Orientation Level: Oriented X4 Awareness: Appears intact Safety/Judgment: Appears intact Sensation Sensation Light Touch: Appears Intact Coordination Gross Motor Movements are Fluid and Coordinated: No Fine Motor Movements are Fluid and Coordinated: No Finger Nose Finger Test: dysmetria RUE, decreased fluidity and impaired timing and sequencing of muscle activation   Extremity/Trunk Assessment RUE Assessment RUE Assessment: Exceptions to [facility] RUE Strength RUE Overall Strength Comments: 4-/5 shoulder flexion, 4/5 globally  Gross Grasp: Impaired LUE Assessment LUE Assessment: Within Functional Limits  See FIM for current functional status  Bailey,Britny 02/16/2013, 2:54 PM

## 2013-02-16 NOTE — Progress Notes (Signed)
This note has been reviewed and this clinician agrees with the information provided. 

## 2013-02-16 NOTE — Discharge Summary (Signed)
NAMEMILESSA, [name]                   ACCOUNT NO.:  [id number]  MEDICAL RECORD NO.:  [id number]  LOCATION:  4W12C                        FACILITY:  [facility]  PHYSICIAN:  Erick Colace, M.D.DATE OF BIRTH:  [birth year]  DATE OF ADMISSION:  02/03/2013 DATE OF DISCHARGE:  02/16/2013                              DISCHARGE SUMMARY   DISCHARGE DIAGNOSES: 1. Left thalamic internal capsule infarction. 2. Sequential compression devices for deep vein thrombosis     prophylaxis, hypertension, diabetes mellitus with peripheral     neuropathy.  HISTORY OF PRESENT ILLNESS:  This is a 37 year old non-English speaking female, history of diabetes mellitus, hypertension, admitted on January 30, 2013, with right-sided weakness and numbness.  Systolic blood pressure 200.  Noted blood sugar 307 on admission.  MRI of the brain with acute nonhemorrhagic linear infarct involving the lateral left thalamus and potentially internal capsule, MRA without stenosis. Echocardiogram with ejection fraction of 60-65%, no wall motion abnormalities.  Carotid Dopplers without ICA stenosis.  Neurology Service was consulted, placed on aspirin therapy.  Physical and occupational therapy ongoing, patient was admitted for comprehensive rehab program.  PAST MEDICAL HISTORY:  See discharge diagnoses.  SOCIAL HISTORY:  Lives with family.  Functional history prior to admission independent.  Functional status upon admission to Rehab Services was +2 total assist to ambulate 54 feet, two person handheld assistance.  PHYSICAL EXAMINATION:  VITAL SIGNS:  Blood pressure 110/80, pulse 89, temperature 97.5, respirations 18. GENERAL:  This was an alert female, non-English speaking, made good eye contact with examiner.  She would follow simple motor commands. HEENT:  Pupils round and reactive to light. LUNGS:  Clear to auscultation. CARDIAC:  Regular rate and rhythm. ABDOMEN:  Soft, nontender.  Good bowel sounds.  REHABILITATION  HOSPITAL COURSE:  The patient was admitted to Inpatient Rehab Services with therapies initiated on a 3-hour daily basis consisting of physical therapy, occupational therapy, and rehabilitation nursing.  The following issues were addressed during the patient's rehabilitation stay.  Pertaining to [name] left thalamic internal capsule infarction remained stable, maintained on aspirin therapy.  She would follow up with Neurology Services.  Sequential compression devices were in place for DVT prophylaxis.  Blood pressures monitored and controlled on lisinopril 5 mg daily.  Again arrangements were to be made for a primary care provider.  She did have a history of diabetes mellitus with poor control.  Hemoglobin A1c 13.1, her Lantus insulin was adjusted accordingly.  She received full counseling and education in regard to her diabetes mellitus and hypertension through an interpreter. The patient received weekly collaborative interdisciplinary team conferences to discuss estimated length of stay, family teaching, and any barriers to discharge.  She was ambulating extended distances with a rolling walker greater than 150 feet with supervision and verbal cues. Simple set up for activities of daily living.  The patient completed lower body dressing by steading herself on a rolling walker.  Full family teaching was completed and plan was to be discharged to home with ongoing therapies dictated per [employer].  DISCHARGE MEDICATIONS:  Included: 1. Aspirin 81 mg p.o. daily. 2. Lipitor 40 mg p.o. daily. 3. Neurontin 200 mg p.o. t.i.d.  4. Lantus insulin 15 units subcutaneously daily. 5. Lisinopril 5 mg p.o. daily.  DIET:  Diabetic diet.  SPECIAL INSTRUCTIONS:  The patient would follow up with Dr. Claudette Laws at the [facility] on March 16, 2013; Dr. Delia Heady, Neurology Services 1 month call for appointment; and primary care provider to be arranged by case  management.     Mariam Dollar, P.A.   ______________________________ Erick Colace, M.D.    DA/MEDQ  D:  02/16/2013  T:  02/16/2013  Job:  [id number]  cc:   Pramod P. Pearlean Brownie, MD

## 2013-02-16 NOTE — Progress Notes (Signed)
Orthopedic Tech Progress Note Patient Details:  [name] 05-17-1975 [id number]  Patient ID: [name], female   DOB: 07-May-1975, 37 y.o.   MRN: [id number]   [name] 02/16/2013, 10:41 [facility] for right foot up brace.

## 2013-02-16 NOTE — Progress Notes (Signed)
Social Work Discharge Note  The overall goal for the admission was met for:   Discharge location:  Yes - home with her brother, children, and mother (during the day)  Length of Stay: Yes - 13 days  Discharge activity level: Yes - modified independent with some supervision  Home/community participation: Yes  Services provided included: MD, RD, PT, OT, RN, Pharmacy and SW  Financial Services: Private Insurance: Valinda Hoar Blue Shield  Follow-up services arranged: Outpatient: PT/OT [facility] [address], DME: [employer], structural and Patient/Family has no preference for HH/DME agencies - [facility] for DME  Comments (or additional information):  Patient/Family verbalized understanding of follow-up arrangements: Yes  Individual responsible for coordination of the follow-up plan: Pt with her brother's support for appointments  Confirmed correct DME delivered: Elvera Lennox 02/16/2013    Paz Winsett, [place]

## 2013-02-17 NOTE — Progress Notes (Signed)
I have reviewed and I agree with the following discharge note.  Edman Circle, PT, DPT

## 2013-02-18 ENCOUNTER — Ambulatory Visit: Payer: BC Managed Care – PPO | Attending: Physical Medicine & Rehabilitation

## 2013-02-18 ENCOUNTER — Ambulatory Visit: Payer: BC Managed Care – PPO | Admitting: Occupational Therapy

## 2013-02-18 DIAGNOSIS — R262 Difficulty in walking, not elsewhere classified: Secondary | ICD-10-CM | POA: Insufficient documentation

## 2013-02-18 DIAGNOSIS — IMO0001 Reserved for inherently not codable concepts without codable children: Secondary | ICD-10-CM | POA: Insufficient documentation

## 2013-02-18 DIAGNOSIS — R279 Unspecified lack of coordination: Secondary | ICD-10-CM | POA: Insufficient documentation

## 2013-02-24 ENCOUNTER — Ambulatory Visit: Payer: BC Managed Care – PPO | Admitting: Occupational Therapy

## 2013-02-24 ENCOUNTER — Ambulatory Visit: Payer: BC Managed Care – PPO

## 2013-02-25 ENCOUNTER — Ambulatory Visit: Payer: BC Managed Care – PPO | Admitting: Physical Therapy

## 2013-02-25 ENCOUNTER — Ambulatory Visit: Payer: BC Managed Care – PPO | Admitting: Occupational Therapy

## 2013-02-27 ENCOUNTER — Ambulatory Visit: Payer: BC Managed Care – PPO | Admitting: Physical Therapy

## 2013-03-02 ENCOUNTER — Encounter: Payer: BC Managed Care – PPO | Admitting: Family Medicine

## 2013-03-02 ENCOUNTER — Encounter: Payer: Self-pay | Admitting: Family Medicine

## 2013-03-02 NOTE — Progress Notes (Signed)
This encounter was created in error - please disregard.

## 2013-03-02 NOTE — Progress Notes (Signed)
Via [place] interpreter... Pt is here for a f/u for CVA Reports she's feeling much better... Voices no new concerns Alert w/no signs of acute distress.

## 2013-03-03 ENCOUNTER — Ambulatory Visit: Payer: BC Managed Care – PPO | Admitting: Occupational Therapy

## 2013-03-03 ENCOUNTER — Ambulatory Visit: Payer: BC Managed Care – PPO

## 2013-03-03 ENCOUNTER — Ambulatory Visit (INDEPENDENT_AMBULATORY_CARE_PROVIDER_SITE_OTHER): Payer: BC Managed Care – PPO | Admitting: Physician Assistant

## 2013-03-03 ENCOUNTER — Other Ambulatory Visit: Payer: Self-pay | Admitting: Physician Assistant

## 2013-03-03 VITALS — BP 136/82 | HR 96 | Temp 97.7°F | Resp 18 | Ht 61.0 in | Wt 160.0 lb

## 2013-03-03 DIAGNOSIS — I639 Cerebral infarction, unspecified: Secondary | ICD-10-CM

## 2013-03-03 DIAGNOSIS — E119 Type 2 diabetes mellitus without complications: Secondary | ICD-10-CM

## 2013-03-03 DIAGNOSIS — I10 Essential (primary) hypertension: Secondary | ICD-10-CM

## 2013-03-03 DIAGNOSIS — R894 Abnormal immunological findings in specimens from other organs, systems and tissues: Secondary | ICD-10-CM

## 2013-03-03 DIAGNOSIS — R768 Other specified abnormal immunological findings in serum: Secondary | ICD-10-CM | POA: Insufficient documentation

## 2013-03-03 DIAGNOSIS — E785 Hyperlipidemia, unspecified: Secondary | ICD-10-CM | POA: Insufficient documentation

## 2013-03-03 DIAGNOSIS — IMO0001 Reserved for inherently not codable concepts without codable children: Secondary | ICD-10-CM

## 2013-03-03 DIAGNOSIS — I6350 Cerebral infarction due to unspecified occlusion or stenosis of unspecified cerebral artery: Secondary | ICD-10-CM

## 2013-03-03 LAB — COMPREHENSIVE METABOLIC PANEL
ALT: 56 U/L — ABNORMAL HIGH (ref 0–35)
AST: 20 U/L (ref 0–37)
Albumin: 4.4 g/dL (ref 3.5–5.2)
Alkaline Phosphatase: 56 U/L (ref 39–117)
BUN: 16 mg/dL (ref 6–23)
CO2: 26 mEq/L (ref 19–32)
Calcium: 9.9 mg/dL (ref 8.4–10.5)
Chloride: 104 mEq/L (ref 96–112)
Creat: 0.67 mg/dL (ref 0.50–1.10)
Glucose, Bld: 208 mg/dL — ABNORMAL HIGH (ref 70–99)
Potassium: 4.7 mEq/L (ref 3.5–5.3)
Sodium: 140 mEq/L (ref 135–145)
Total Bilirubin: 0.5 mg/dL (ref 0.3–1.2)
Total Protein: 7.5 g/dL (ref 6.0–8.3)

## 2013-03-03 LAB — POCT CBC
Granulocyte percent: 80.5 %G — AB (ref 37–80)
HCT, POC: 43 % (ref 37.7–47.9)
Hemoglobin: 13.8 g/dL (ref 12.2–16.2)
Lymph, poc: 1.5 (ref 0.6–3.4)
MCH, POC: 28.1 pg (ref 27–31.2)
MCHC: 32.1 g/dL (ref 31.8–35.4)
MCV: 87.5 fL (ref 80–97)
MID (cbc): 0.6 (ref 0–0.9)
MPV: 9.9 fL (ref 0–99.8)
POC Granulocyte: 8.6 — AB (ref 2–6.9)
POC LYMPH PERCENT: 14 %L (ref 10–50)
POC MID %: 5.5 %M (ref 0–12)
Platelet Count, POC: 292 10*3/uL (ref 142–424)
RBC: 4.91 M/uL (ref 4.04–5.48)
RDW, POC: 15.9 %
WBC: 10.7 10*3/uL — AB (ref 4.6–10.2)

## 2013-03-03 LAB — MICROALBUMIN, URINE: Microalb, Ur: 7.03 mg/dL — ABNORMAL HIGH (ref 0.00–1.89)

## 2013-03-03 LAB — GLUCOSE, POCT (MANUAL RESULT ENTRY): POC Glucose: 225 mg/dl — AB (ref 70–99)

## 2013-03-03 LAB — TSH: TSH: 0.978 u[IU]/mL (ref 0.350–4.500)

## 2013-03-03 MED ORDER — INSULIN GLARGINE 100 UNIT/ML ~~LOC~~ SOLN: 15.0000 [IU] | Freq: Every day | SUBCUTANEOUS | Status: DC

## 2013-03-03 MED ORDER — LISINOPRIL 5 MG PO TABS: 5.0000 mg | ORAL_TABLET | Freq: Every day | ORAL | Status: DC

## 2013-03-03 MED ORDER — ATORVASTATIN CALCIUM 40 MG PO TABS: 40.0000 mg | ORAL_TABLET | Freq: Every day | ORAL | Status: DC

## 2013-03-03 MED ORDER — GABAPENTIN 100 MG PO CAPS: 200.0000 mg | ORAL_CAPSULE | Freq: Three times a day (TID) | ORAL | Status: DC

## 2013-03-03 NOTE — Patient Instructions (Addendum)
I will contact you with your lab results as soon as they are available.   If you have not heard from me in 2 weeks, please contact me.  The fastest way to get your results is to register for My Chart (see the instructions on the last page of this printout).  Continue the appointments with the therapists and working on regaining your strength and function. Continue taking the medications EVERY DAY. INCREASE the Lantus to 20 units each day.  If you have not heard anything regarding the referral to neurology in 1 week, please contact our office.

## 2013-03-03 NOTE — Progress Notes (Signed)
[address]  [place], [place] [address]  [phone]  [email or web address]  Subjective:    Patient ID: [name], female    DOB: [birth year], 37 y.o.   MRN: [id number]  HPI This patient presents for hospital follow-up and medication refill.  She was admitted to the hospital on 01/30/2013 with RIGHT sided weakness and found to have a LEFT thalamic stroke. She was started back on medications to address HTN, DM and hyperlipidemia (she has a history of noncompliance, and family history of heart disease and stroke in BOTH parents), and spent some time in rehab to regain skills for ambulation and ADLs.  She is brought in by her brother today, with whom I am familiar as he accompanies his mother here, and accompanied his late father.  She doesn't speak [place], and her brother is here to translate.  She understands most of what I ask. She and her children were living with friends, but have moved in with her brother and mother since hospital discharge. She ambulates with a rolling walker, wears an ankle support on the LEFT and can dress herself. The strength in the right hand and arm is improving faster than in the leg. She needs help with cooking and cleaning, and driving her children to school (they are 64 and 39 years old). 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 He sees Dr. Jess Barters on 12/08 and needs a follow-up appointment with neurology.  Her hospital notes and labs are reviewed.  She received flu vaccine during her hospitalization.  Of note, her LFTs were mildly elevated and she had a positive HCV ab test.  Review of Systems No chest pain, SOB, HA, dizziness, vision change, N/V, diarrhea, constipation, dysuria, urinary urgency or frequency, myalgias, arthralgias or rash.     Objective:   Physical Exam  Vitals reviewed. Constitutional: She is oriented to person, place, and time. Vital signs are normal. She appears well-developed and  well-nourished. She is active and cooperative. No distress.  HENT:  Head: Normocephalic and atraumatic.  Right Ear: Hearing, tympanic membrane, external ear and ear canal normal.  Left Ear: Hearing, tympanic membrane, external ear and ear canal normal.  Nose: Nose normal.  Mouth/Throat: Uvula is midline, oropharynx is clear and moist and mucous membranes are normal. No oropharyngeal exudate.  Eyes: Conjunctivae, EOM and lids are normal. Pupils are equal, round, and reactive to light. Right eye exhibits no discharge. Left eye exhibits no discharge. No scleral icterus.  Neck: Normal range of motion. Neck supple. No thyromegaly present.  Cardiovascular: Normal rate, regular rhythm and normal heart sounds.   Pulses:      Radial pulses are 2+ on the right side, and 2+ on the left side.  Pulmonary/Chest: Effort normal and breath sounds normal.  Lymphadenopathy:       Head (right side): No tonsillar, no preauricular, no posterior auricular and no occipital adenopathy present.       Head (left side): No tonsillar, no preauricular, no posterior auricular and no occipital adenopathy present.    She has no cervical adenopathy.       Right: No supraclavicular adenopathy present.       Left: No supraclavicular adenopathy present.  Neurological: She is alert and oriented to person, place, and time. No cranial nerve deficit.  Mild weakness in the RIGHT hand/arm and RIGHT leg. Weakness more prominent in the leg with ambulation.  Brace on the RIGHT ankle.  Skin: Skin  is warm, dry and intact. No rash noted. No cyanosis or erythema. Nails show no clubbing.  Psychiatric: She has a normal mood and affect.      Results for orders placed in visit on 03/03/13  POCT CBC      Result Value Range   WBC 10.7 (*) 4.6 - 10.2 K/uL   Lymph, poc 1.5  0.6 - 3.4   POC LYMPH PERCENT 14.0  10 - 50 %L   MID (cbc) 0.6  0 - 0.9   POC MID % 5.5  0 - 12 %M   POC Granulocyte 8.6 (*) 2 - 6.9   Granulocyte percent 80.5 (*) 37  - 80 %G   RBC 4.91  4.04 - 5.48 M/uL   Hemoglobin 13.8  12.2 - 16.2 g/dL   HCT, POC 19.1  47.8 - 47.9 %   MCV 87.5  80 - 97 fL   MCH, POC 28.1  27 - 31.2 pg   MCHC 32.1  31.8 - 35.4 g/dL   RDW, POC 29.5     Platelet Count, POC 292  142 - 424 K/uL   MPV 9.9  0 - 99.8 fL  GLUCOSE, POCT (MANUAL RESULT ENTRY)      Result Value Range   POC Glucose 225 (*) 70 - 99 mg/dl       Assessment & Plan:  CVA (cerebral infarction) - Plan: Ambulatory referral to Neurology, gabapentin (NEURONTIN) 100 MG capsule, continue aspirin.  DM (diabetes mellitus) - not yet controlled. Plan: atorvastatin (LIPITOR) 40 MG tablet, insulin glargine (LANTUS) 100 UNIT/ML injection, POCT glucose (manual entry), Comprehensive metabolic panel, Microalbumin, urine; INCREASE Lantus to 20 units daily. She needs a foot exam and pneumococcal vaccine, also dilated eye exam and dental evaluation.  RTC 2 weeks.  HTN (hypertension) - controlled. Plan: lisinopril (PRINIVIL,ZESTRIL) 5 MG tablet, POCT CBC, TSH  Hyperlipidemia - continue statin. Recheck fasting lipids in 2 months.  HCV antibody positive - Plan: HCV RNA, PCR, Qualitative  Fernande Bras, PA-C Physician Assistant-Certified [facility]

## 2013-03-04 LAB — HCV RNA, PCR, QUALITATIVE: Hepatitis C Vrs RNA by PCR-Qual: POSITIVE — AB

## 2013-03-09 LAB — HEPATITIS C GENOTYPE

## 2013-03-10 ENCOUNTER — Ambulatory Visit: Payer: BC Managed Care – PPO | Attending: Physical Medicine & Rehabilitation

## 2013-03-10 ENCOUNTER — Ambulatory Visit: Payer: BC Managed Care – PPO | Admitting: Occupational Therapy

## 2013-03-10 DIAGNOSIS — R279 Unspecified lack of coordination: Secondary | ICD-10-CM | POA: Insufficient documentation

## 2013-03-10 DIAGNOSIS — R262 Difficulty in walking, not elsewhere classified: Secondary | ICD-10-CM | POA: Insufficient documentation

## 2013-03-10 DIAGNOSIS — IMO0001 Reserved for inherently not codable concepts without codable children: Secondary | ICD-10-CM | POA: Insufficient documentation

## 2013-03-12 ENCOUNTER — Ambulatory Visit: Payer: BC Managed Care – PPO

## 2013-03-12 ENCOUNTER — Ambulatory Visit: Payer: BC Managed Care – PPO | Admitting: Occupational Therapy

## 2013-03-13 ENCOUNTER — Encounter: Payer: Self-pay | Admitting: Physician Assistant

## 2013-03-13 NOTE — Addendum Note (Signed)
Addended by: Fernande Bras on: 03/13/2013 10:35 PM   Modules accepted: Orders

## 2013-03-14 ENCOUNTER — Encounter: Payer: Self-pay | Admitting: Family Medicine

## 2013-03-16 ENCOUNTER — Encounter: Payer: Self-pay | Admitting: Physical Medicine & Rehabilitation

## 2013-03-16 ENCOUNTER — Encounter: Payer: BC Managed Care – PPO | Attending: Physical Medicine & Rehabilitation

## 2013-03-16 ENCOUNTER — Ambulatory Visit (HOSPITAL_BASED_OUTPATIENT_CLINIC_OR_DEPARTMENT_OTHER): Payer: BC Managed Care – PPO | Admitting: Physical Medicine & Rehabilitation

## 2013-03-16 VITALS — BP 141/85 | HR 101 | Resp 14 | Wt 164.0 lb

## 2013-03-16 DIAGNOSIS — I69959 Hemiplegia and hemiparesis following unspecified cerebrovascular disease affecting unspecified side: Secondary | ICD-10-CM | POA: Insufficient documentation

## 2013-03-16 DIAGNOSIS — E119 Type 2 diabetes mellitus without complications: Secondary | ICD-10-CM | POA: Insufficient documentation

## 2013-03-16 DIAGNOSIS — G8110 Spastic hemiplegia affecting unspecified side: Secondary | ICD-10-CM

## 2013-03-16 DIAGNOSIS — E7800 Pure hypercholesterolemia, unspecified: Secondary | ICD-10-CM | POA: Insufficient documentation

## 2013-03-16 DIAGNOSIS — I10 Essential (primary) hypertension: Secondary | ICD-10-CM | POA: Insufficient documentation

## 2013-03-16 NOTE — Patient Instructions (Signed)
No driving until pt can ambulate without walker Follow up with neurology this week

## 2013-03-16 NOTE — Progress Notes (Signed)
Subjective:    Patient ID: [name], female    DOB: 1975/09/02, 37 y.o.   MRN: [id number]  HPI This is a 37 year old non-English speaking  female, history of diabetes mellitus, hypertension, admitted on January 30, 2013, with right-sided weakness and numbness. Systolic blood  pressure 200. Noted blood sugar 307 on admission. MRI of the brain  with acute nonhemorrhagic linear infarct involving the lateral left  thalamus and potentially internal capsule, MRA without stenosis.  Echocardiogram with ejection fraction of 60-65%, no wall motion  abnormalities. Carotid Dopplers without ICA stenosis. Neurology  Service was consulted, placed on aspirin therapy. Physical and  occupational therapy ongoing, patient was admitted for comprehensive  rehab program. Patient with poor [place] skills. Brother is translating.  Per brothers report Completed PT,OT, SLP outpt still weak on Right side Cannot stand a long time without pain in the right leg.  Dressing and bathing herself independently. Not working. Not driving. Has 2 children ages 82 and 51  Since discharge to the hospital has followed up with primary care doctor. Was diagnosed with hepatitis C. Recommendations are to followup with hepatitis specialist Pain Inventory Average Pain 0 Pain Right Now 0 My pain is n/a  In the last 24 hours, has pain interfered with the following? General activity 0 Relation with others 0 Enjoyment of life 0 What TIME of day is your pain at its worst? n/a Sleep (in general) Fair  Pain is worse with: walking and standing Pain improves with: n/a Relief from Meds: n/a  Mobility walk with assistance use a walker ability to climb steps?  no do you drive?  no  Function not employed: date last employed 01/29/2013  Neuro/Psych numbness trouble walking  Prior Studies Any changes since last visit?  no  Physicians involved in your care Any changes since last visit?  no   Family History  Problem  Relation Age of Onset  . Stroke Mother   . Hypertension Mother   . Hyperlipidemia Mother   . Stroke Father   . Hypertension Father   . Hyperlipidemia Father   . Heart disease Father   . Hyperlipidemia Brother    History   Social History  . Marital Status: Single    Spouse Name: n/a    Number of Children: 2  . Years of Education: 6th grade   Occupational History  . nail salon    Social History Main Topics  . Smoking status: Never Smoker   . Smokeless tobacco: Never Used  . Alcohol Use: No  . Drug Use: No  . Sexual Activity: None   Other Topics Concern  . None   Social History Narrative   From [place], though family is [place], came to the [place] in 1999. Lives with her brother, mother, and two children.   Past Surgical History  Procedure Laterality Date  . Cesarean section     Past Medical History  Diagnosis Date  . Hypertension   . Hypercholesteremia   . Diabetes mellitus without complication   . Acute ischemic stroke 01/31/2013   BP 141/85  Pulse 101  Resp 14  Wt 164 lb (74.39 kg)  SpO2 99%  LMP 03/02/2013     Review of Systems  Musculoskeletal: Positive for gait problem.  Neurological: Positive for numbness.  All other systems reviewed and are negative.       Objective:   Physical Exam   4+/5 strength in the right deltoid, bicep, tricep, grip, hip flexor, knee extensors, ankle dorsiflexor and  plantar flexor 5/5 strength in the left side in the upper and lower limb. Sensation equal to light touch in both upper and lower No evidence of facial treatment Ambulates without a walker short distance slowly small step length no foot drag      Assessment & Plan:  1. Right hemiparesis improving after left CVA. Per brothers report, Has completed therapy. Would recommend continuing home exercise program No driving until she is not requiring walker for ambulation. Brother reports patient's mental status seems about normal.  Will require further workup  for hepatitis C. Has neurologic followup as well. Continues to followup with primary care physician  Return to clinic 2 months

## 2013-03-17 ENCOUNTER — Ambulatory Visit: Payer: BC Managed Care – PPO | Admitting: Occupational Therapy

## 2013-03-17 ENCOUNTER — Ambulatory Visit: Payer: BC Managed Care – PPO

## 2013-03-18 ENCOUNTER — Encounter: Payer: Self-pay | Admitting: Nurse Practitioner

## 2013-03-18 ENCOUNTER — Ambulatory Visit (INDEPENDENT_AMBULATORY_CARE_PROVIDER_SITE_OTHER): Payer: BC Managed Care – PPO | Admitting: Nurse Practitioner

## 2013-03-18 VITALS — BP 117/81 | HR 101 | Temp 97.1°F | Ht 61.0 in | Wt 164.0 lb

## 2013-03-18 DIAGNOSIS — M6281 Muscle weakness (generalized): Secondary | ICD-10-CM

## 2013-03-18 DIAGNOSIS — R200 Anesthesia of skin: Secondary | ICD-10-CM

## 2013-03-18 DIAGNOSIS — I639 Cerebral infarction, unspecified: Secondary | ICD-10-CM

## 2013-03-18 DIAGNOSIS — R209 Unspecified disturbances of skin sensation: Secondary | ICD-10-CM

## 2013-03-18 DIAGNOSIS — I6350 Cerebral infarction due to unspecified occlusion or stenosis of unspecified cerebral artery: Secondary | ICD-10-CM

## 2013-03-18 DIAGNOSIS — R531 Weakness: Secondary | ICD-10-CM

## 2013-03-18 MED ORDER — CLOPIDOGREL BISULFATE 75 MG PO TABS: 75.0000 mg | ORAL_TABLET | Freq: Every day | ORAL | Status: DC

## 2013-03-18 MED ORDER — GABAPENTIN 100 MG PO CAPS: 300.0000 mg | ORAL_CAPSULE | Freq: Three times a day (TID) | ORAL | Status: DC

## 2013-03-18 NOTE — Progress Notes (Signed)
PATIENT: [name] DOB: 05-Jan-1976   REASON FOR VISIT: hospital follow up for stroke HISTORY FROM: patient, with interpreter  HISTORY OF PRESENT ILLNESS: [name] is a 37 y.o. female, right handed, with a past medical history significant for HTN, DM, and hypercholesterolemia, brought to [facility] ED 01/30/2013 by EMS due to acute onset right sided weakness-numbness. Never had similar symptoms before. She was last seen normal by family at 1 am that day. Then, she went to the bathroom and complained of having weakness and numbness of the right side. Went to bed by approximately 15 minutes expressed to her family that she needed to go to the hospital. SBP 200 and CBG 307 when first evaluated by EMS. No reported spurred speech, vertigo, double vision, difficulty swallowing, imbalance, confusion, language or vision impairment. She did have a HA at home. Upon arrival to ED initial NIHSS 3. CT brain showed no acute intracranial abnormality.  MRI of the brain with acute nonhemorrhagic linear infarct involving the lateral left thalamus and potentially internal capsule, MRA without stenosis. Echocardiogram with ejection fraction of 60-65%, no wall motion abnormalities. Carotid Dopplers without ICA stenosis. She is tolerating aspirin therapy. Physical and occupational therapy ongoing, patient was admitted for comprehensive rehab program. Patient with poor [place] skills. Brother is translating.  Per brothers report she has almost completed PT,OT, SLP.  She is still weak on the right side, and is taking Gabapentin for neuropathic pain in the right arm and leg.  She cannot stand a long time without pain in the right leg. She is ambulating with a walker.  Dressing and bathing herself independently. Not working. Not driving. Since discharge to the hospital has followed up with primary care doctor. Was diagnosed with hepatitis C. Recommendations are to followup with hepatitis specialist, she has not had this appointment  yet.  REVIEW OF SYSTEMS: Full 14 system review of systems performed and notable only for: weakness, pain in right leg   ALLERGIES: No Known Allergies  HOME MEDICATIONS: Outpatient Prescriptions Prior to Visit  Medication Sig Dispense Refill  . atorvastatin (LIPITOR) 40 MG tablet Take 1 tablet (40 mg total) by mouth daily at 6 PM.  90 tablet  1  . insulin glargine (LANTUS) 100 UNIT/ML injection Inject 0.15 mLs (15 Units total) into the skin daily.  4 pen  12  . lisinopril (PRINIVIL,ZESTRIL) 5 MG tablet Take 1 tablet (5 mg total) by mouth daily.  90 tablet  1  . aspirin EC 81 MG EC tablet Take 1 tablet (81 mg total) by mouth daily.      Marland Kitchen gabapentin (NEURONTIN) 100 MG capsule Take 2 capsules (200 mg total) by mouth 3 (three) times daily.  270 capsule  1   No facility-administered medications prior to visit.    PAST MEDICAL HISTORY: Past Medical History  Diagnosis Date  . Hypertension   . Hypercholesteremia   . Diabetes mellitus without complication   . Acute ischemic stroke 01/31/2013    PAST SURGICAL HISTORY: Past Surgical History  Procedure Laterality Date  . Cesarean section      FAMILY HISTORY: Family History  Problem Relation Age of Onset  . Stroke Mother   . Hypertension Mother   . Hyperlipidemia Mother   . Stroke Father   . Hypertension Father   . Hyperlipidemia Father   . Heart disease Father   . Hyperlipidemia Brother     SOCIAL HISTORY: History   Social History  . Marital Status: Single    Spouse  Name: n/a    Number of Children: 2  . Years of Education: 6th grade   Occupational History  . nail salon    Social History Main Topics  . Smoking status: Never Smoker   . Smokeless tobacco: Never Used  . Alcohol Use: No  . Drug Use: No  . Sexual Activity: Not on file   Other Topics Concern  . Not on file   Social History Narrative   From [place], though family is [place], came to the [place] in 1999. Lives with her brother, mother, and two children.      PHYSICAL EXAM  Filed Vitals:   03/18/13 1316  BP: 117/81  Pulse: 101  Temp: 97.1 F (36.2 C)  Height: 5\' 1"  (1.549 m)  Weight: 164 lb (74.39 kg)   Body mass index is 31 kg/(m^2).  Generalized: Well developed, in no acute distress  Head: normocephalic and atraumatic. Oropharynx benign  Neck: Supple, no carotid bruits  Cardiac: Regular rate rhythm, no murmur  Musculoskeletal: No deformity   Neurological examination  Mentation: Alert oriented to time, place, history taking. Follows all commands speech and language fluent Cranial nerve II-XII: Pupils were equal round reactive to light extraocular movements were full, visual field were full on confrontational test. Facial sensation and strength were normal. hearing was intact to finger rubbing bilaterally. Uvula tongue midline. head turning and shoulder shrug and were normal and symmetric. Motor: 4+/5 strength in the right deltoid, bicep, tricep, grip, hip flexor, knee extensors, ankle dorsiflexor and plantar flexor, 5/5 strength in the left side in the upper and lower limb.  Fine finger movements are decreased on the right, no pronator drift.  Sensory: decreased to light touch, pinprick on the right arm and leg, normal on the left. Coordination: finger-nose-finger, heel-to-shin bilaterally, mild dysmetria on the right. Reflexes:  Deep tendon reflexes in the upper and lower extremities are present and symmetric.  Gait and Station: Rising up from seated position without assistance, right hemiplegic gait. Unable to perform tiptoe, and heel walking without difficulty.  Romberg negative.  DIAGNOSTIC DATA (LABS, IMAGING, TESTING) - I reviewed patient records, labs, notes, testing and imaging myself where available.  Lab Results  Component Value Date   WBC 10.7* 03/03/2013   HGB 13.8 03/03/2013   HCT 43.0 03/03/2013   MCV 87.5 03/03/2013   PLT 251 02/04/2013      Component Value Date/Time   NA 140 03/03/2013 1046   K 4.7  03/03/2013 1046   CL 104 03/03/2013 1046   CO2 26 03/03/2013 1046   GLUCOSE 208* 03/03/2013 1046   BUN 16 03/03/2013 1046   CREATININE 0.67 03/03/2013 1046   CREATININE 0.63 02/04/2013 0548   CALCIUM 9.9 03/03/2013 1046   PROT 7.5 03/03/2013 1046   ALBUMIN 4.4 03/03/2013 1046   AST 20 03/03/2013 1046   ALT 56* 03/03/2013 1046   ALKPHOS 56 03/03/2013 1046   BILITOT 0.5 03/03/2013 1046   GFRNONAA >90 02/04/2013 0548   GFRAA >90 02/04/2013 0548   Lab Results  Component Value Date   CHOL 240* 01/31/2013   HDL 56 01/31/2013   LDLCALC 158* 01/31/2013   TRIG 129 01/31/2013   CHOLHDL 4.3 01/31/2013   Lab Results  Component Value Date   HGBA1C 13.1* 01/30/2013   Lab Results  Component Value Date   VITAMINB12 1330* 02/01/2013   Lab Results  Component Value Date   TSH 0.978 03/03/2013   Lab Results  Component Value Date   ESRSEDRATE 8 02/03/2013  ASSESSMENT AND PLAN Ms. Shawnese Magner is a 37 y.o. female presenting with right hemiparesis and hemisensory deficits on 01/30/13.  An MRI showed acute nonhemorrhagic linear infarcts involving the lateral left thalamus and potentially internal capsule. Infarcts felt to be likely small vessel disease.  Patient with resultant right hemiparesis and right hemisensory deficits.   PLAN: Start  clopidogrel 75 mg orally every day for secondary stroke prevention. Stop taking daily aspirin. For pain, you may take 3 tablets of Gabapentin every 6 hours. Please be sure to keep appointment and follow up with hepatitis specialist; avoid Tylenol and products containing acetaminophen. Maintain strict control of hypertension with blood pressure goal below 140/90, diabetes with hemoglobin A1c goal below 7% and lipids with LDL cholesterol goal below 70 mg/dL. Followup in the future with me in 3-4 months.  Meds ordered this encounter  Medications  . gabapentin (NEURONTIN) 100 MG capsule    Sig: Take 3 capsules (300 mg total) by mouth 3 (three) times  daily.    Dispense:  270 capsule    Refill:  3    Order Specific Question:  Supervising Provider    Answer:  Pearlean Brownie, PRAMOD S [[id number]]  . clopidogrel (PLAVIX) 75 MG tablet    Sig: Take 1 tablet (75 mg total) by mouth daily.    Dispense:  30 tablet    Refill:  11    Order Specific Question:  Supervising Provider    Answer:  Micki Riley [[id number]]   Return in about 3 months (around 06/16/2013).  Ronal Fear, MSN, NP-C 03/18/2013, 2:30 PM [facility] [address] [place], [place] [address] [phone]  Note: This document was prepared with digital dictation and possible smart phrase technology. Any transcriptional errors that result from this process are unintentional.

## 2013-03-18 NOTE — Patient Instructions (Addendum)
PLAN: Start  clopidogrel 75 mg orally every day for secondary stroke prevention. Stop taking daily aspirin. For pain, you may take 3 tablets of Gabapentin every 6 hours. Please be sure to keep appointment and follow up with hepatitis specialist; avoid Tylenol and products containing acetaminophen. Maintain strict control of hypertension with blood pressure goal below 140/90, diabetes with hemoglobin A1c goal below 7% and lipids with LDL cholesterol goal below 70 mg/dL. Followup in the future with me in 3-4 months.

## 2013-03-19 ENCOUNTER — Ambulatory Visit: Payer: BC Managed Care – PPO | Admitting: Occupational Therapy

## 2013-03-19 ENCOUNTER — Ambulatory Visit: Payer: BC Managed Care – PPO

## 2013-03-24 ENCOUNTER — Ambulatory Visit: Payer: BC Managed Care – PPO

## 2013-03-24 ENCOUNTER — Ambulatory Visit: Payer: BC Managed Care – PPO | Admitting: Occupational Therapy

## 2013-03-25 ENCOUNTER — Ambulatory Visit (INDEPENDENT_AMBULATORY_CARE_PROVIDER_SITE_OTHER): Payer: BC Managed Care – PPO | Admitting: Physician Assistant

## 2013-03-25 VITALS — BP 126/90 | HR 94 | Temp 98.0°F | Resp 18 | Ht 60.5 in | Wt 164.6 lb

## 2013-03-25 DIAGNOSIS — I6350 Cerebral infarction due to unspecified occlusion or stenosis of unspecified cerebral artery: Secondary | ICD-10-CM

## 2013-03-25 DIAGNOSIS — R894 Abnormal immunological findings in specimens from other organs, systems and tissues: Secondary | ICD-10-CM

## 2013-03-25 DIAGNOSIS — I10 Essential (primary) hypertension: Secondary | ICD-10-CM

## 2013-03-25 DIAGNOSIS — E119 Type 2 diabetes mellitus without complications: Secondary | ICD-10-CM

## 2013-03-25 DIAGNOSIS — I639 Cerebral infarction, unspecified: Secondary | ICD-10-CM

## 2013-03-25 DIAGNOSIS — R768 Other specified abnormal immunological findings in serum: Secondary | ICD-10-CM

## 2013-03-25 DIAGNOSIS — Z23 Encounter for immunization: Secondary | ICD-10-CM

## 2013-03-25 MED ORDER — CLOPIDOGREL BISULFATE 75 MG PO TABS: 75.0000 mg | ORAL_TABLET | Freq: Every day | ORAL | Status: DC

## 2013-03-25 MED ORDER — INSULIN GLARGINE 100 UNIT/ML ~~LOC~~ SOLN: 20.0000 [IU] | Freq: Every day | SUBCUTANEOUS | Status: DC

## 2013-03-25 MED ORDER — ATORVASTATIN CALCIUM 40 MG PO TABS: 40.0000 mg | ORAL_TABLET | Freq: Every day | ORAL | Status: DC

## 2013-03-25 NOTE — Patient Instructions (Addendum)
Check your sugar two times each week.   One of those times, check it when you have not had anything to eat or drink for 8-12 hours (usually before breakfast in the morning). The other time, check it 2-3 hours after the largest meal of the day (usually the evening meal). Record the results.  If the morning readings are consistently above 140, or the evening readings are consistently above 180, please let me know.  Schedule a follow up visit with your eye doctor.

## 2013-03-25 NOTE — Progress Notes (Signed)
   Subjective:    Patient ID: [name], female    DOB: 05/03/1975, 37 y.o.   MRN: [id number]  PCP: Carmelina Dane, MD  Chief Complaint  Patient presents with  . Follow-up    for CVA  . Medication Refill    need Insulin    Medications, allergies, past medical history, surgical history, family history, social history and problem list reviewed and updated.  HPI Her brother translates.   Frequency of home glucose monitoring: twice a week, usually after she eats breakfast and takes her insulin (Lantus 20 units), since that how she saw it done during her hospitalization. Reports readings are "about 98." Is current with influenza vaccine. Is not current with pneumococcal vaccine, and will receive one today.  Since her last visit here she has seen both Dr. Wynn Banker and Ms. Lam (at [facility]).  She was started on Plavix, and needs me to re-write it so that she can get a 90-day supply, instead of 30-day supply.  Has not yet heard about the referral to the Hepatitis Clinic, though my referrals staff tells me that the clinic is reviewing her records.  Review of Systems Weakness of the RIGHT arm and leg. No chest pain, SOB, HA, dizziness, vision change, N/V, diarrhea, constipation, dysuria, urinary urgency or frequency, myalgias, arthralgias or rash.     Objective:   Physical Exam  Blood pressure 126/90, pulse 94, temperature 98 F (36.7 C), temperature source Oral, resp. rate 18, height 5' 0.5" (1.537 m), weight 164 lb 9.6 oz (74.662 kg), last menstrual period 03/02/2013, SpO2 99.00%. Body mass index is 31.6 kg/(m^2). Well-developed, well nourished [place] female who is awake, alert and oriented, in NAD. HEENT: [place]/AT, sclera and conjunctiva are clear.   Neck: supple, non-tender, no lymphadenopathy, thyromegaly. Heart: RRR, no murmur Lungs: normal effort, CTA Extremities: no cyanosis, clubbing or edema. Skin: warm and dry without rash. Psychologic: good mood and appropriate affect,  normal speech and behavior.  See DM foot exam.      Assessment & Plan:  1. DM (diabetes mellitus) Improving control. - HM Diabetes Eye Exam - HM Diabetes Foot Exam - atorvastatin (LIPITOR) 40 MG tablet; Take 1 tablet (40 mg total) by mouth daily at 6 PM.  Dispense: 90 tablet; Refill: 3 - insulin glargine (LANTUS) 100 UNIT/ML injection; Inject 0.2 mLs (20 Units total) into the skin daily.  Dispense: 3 pen; Refill: 12  2. HTN (hypertension) Controlled. Continue current treatment.  3. HCV antibody positive Awaiting appointment in Hepatitis Clinic for additional evaluation and possible treatment.  4. CVA (cerebral infarction) Continue follow-up with neurology per their recommendations.   - clopidogrel (PLAVIX) 75 MG tablet; Take 1 tablet (75 mg total) by mouth daily.  Dispense: 90 tablet; Refill: 3  5. Need for pneumococcal vaccination - Pneumococcal polysaccharide vaccine 23-valent greater than or equal to 2yo subcutaneous/IM  Return in about 6 weeks (around 05/06/2013) for re-evaluation of diabetes and cholesterol; FASTING labs.  Fernande Bras, PA-C Physician Assistant-Certified [facility]

## 2013-03-26 ENCOUNTER — Ambulatory Visit: Payer: BC Managed Care – PPO

## 2013-03-26 ENCOUNTER — Ambulatory Visit: Payer: BC Managed Care – PPO | Admitting: Occupational Therapy

## 2013-03-31 ENCOUNTER — Ambulatory Visit: Payer: BC Managed Care – PPO | Admitting: Physical Therapy

## 2013-03-31 ENCOUNTER — Ambulatory Visit: Payer: BC Managed Care – PPO | Admitting: Occupational Therapy

## 2013-04-06 ENCOUNTER — Ambulatory Visit: Payer: BC Managed Care – PPO | Admitting: *Deleted

## 2013-04-07 ENCOUNTER — Ambulatory Visit: Payer: BC Managed Care – PPO

## 2013-04-07 ENCOUNTER — Ambulatory Visit: Payer: BC Managed Care – PPO | Admitting: Occupational Therapy

## 2013-04-10 ENCOUNTER — Ambulatory Visit: Payer: BC Managed Care – PPO | Admitting: Physical Therapy

## 2013-04-14 ENCOUNTER — Ambulatory Visit: Payer: BC Managed Care – PPO

## 2013-04-14 ENCOUNTER — Encounter: Payer: BC Managed Care – PPO | Admitting: Occupational Therapy

## 2013-04-16 ENCOUNTER — Encounter: Payer: BC Managed Care – PPO | Admitting: Occupational Therapy

## 2013-04-16 ENCOUNTER — Ambulatory Visit: Payer: BC Managed Care – PPO | Admitting: Physical Therapy

## 2013-04-20 ENCOUNTER — Encounter: Payer: BC Managed Care – PPO | Attending: Physician Assistant | Admitting: *Deleted

## 2013-04-20 ENCOUNTER — Encounter: Payer: Self-pay | Admitting: *Deleted

## 2013-04-20 VITALS — Ht 62.0 in | Wt 167.2 lb

## 2013-04-20 DIAGNOSIS — Z713 Dietary counseling and surveillance: Secondary | ICD-10-CM | POA: Insufficient documentation

## 2013-04-20 DIAGNOSIS — E119 Type 2 diabetes mellitus without complications: Secondary | ICD-10-CM | POA: Insufficient documentation

## 2013-04-20 NOTE — Progress Notes (Signed)
Appt start time: 0830 end time:  1015.  Assessment:  Patient was seen on  04/20/13 for individual diabetes education. She is here with [place] interpretor and her brother. They states she speaks some [place], they are here for support.  Lives with brother and her own 2 children ages 13 and 10. Brother shops and she cooks for the family. 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 She is getting physical therapy and states she exercises every day. She SMBG 3 times a week with reported range 90 - 107 mg/dl including pre and post meals. No reported symptoms of hypoglycemia since she left the hospital.  Current HbA1c: 13.1 on 01/30/13  Preferred Learning Style:   No preference indicated   Learning Readiness:   Ready  Change in progress  MEDICATIONS: see list. Diabetes medication is Lantus which she states she takes 20 units at 8 AM every day  DIETARY INTAKE:  24-hr recall:  B ( AM): [place] brown rice, occasionally fish stir fried, water to drink Snk ( AM): vegetables in a salad  L ( PM): more vegetables typically in a salad, no starch if she had it with breakfast. She will have lean meat also, water Snk ( PM): none D ( PM): green vegetables, fish or chicken, small portion of brown rice Snk ( PM): none Beverages: water States she used to eat only breakfast and lunch, no supper.   Usual physical activity: she exercises daily in AM  Estimated energy needs: 1200 calories 135 g carbohydrates 90 g protein 33 g fat  Intervention:  Nutrition counseling provided.  Discussed diabetes disease process and treatment options.  Discussed physiology of diabetes and role of obesity on insulin resistance.  Encouraged moderate weight reduction to improve glucose levels.  Discussed role of medications and diet in glucose control  Provided education on macronutrients on glucose levels.  Provided education on carb counting, importance of regularly scheduled meals/snacks, and meal  planning  Discussed effects of physical activity on glucose levels and long-term glucose control.  Recommended 150 minutes of physical activity/week.  Reviewed patient medications.  Discussed role of medication on blood glucose and possible side effects  Discussed blood glucose monitoring and interpretation.  Discussed recommended target ranges and individual ranges.    Described short-term complications: hyper- and hypo-glycemia.  Discussed causes,symptoms, and treatment options.  Provided information on the following:  Prevention, detection, and treatment of long-term complications.  Discussed the role of prolonged elevated glucose levels on body systems.  Role of stress on blood glucose levels and discussed strategies to manage psychosocial issues.  Recommendations for long-term diabetes self-care.  Established checklist for medical, dental, and emotional self-care.  Teaching Method Utilized: all of the following Visual, Auditory, Hands on  Handouts given during visit include: Living Well with Diabetes Carb Counting and Food Label handouts Meal Plan Card  A1c Handout  Barriers to learning/adherence to lifestyle change: language barrier  Diabetes self-care support plan:   [facility] support group  Demonstrated degree of understanding via:  Verbal understanding expressed. She stated this information was a review of what she learned in the hospital.   Monitoring/Evaluation:  Dietary intake, exercise, SMBG, and body weight prn.

## 2013-04-21 ENCOUNTER — Ambulatory Visit: Payer: BC Managed Care – PPO | Attending: Physical Medicine & Rehabilitation | Admitting: Physical Therapy

## 2013-04-21 ENCOUNTER — Ambulatory Visit: Payer: BC Managed Care – PPO | Admitting: Occupational Therapy

## 2013-04-21 DIAGNOSIS — R279 Unspecified lack of coordination: Secondary | ICD-10-CM | POA: Insufficient documentation

## 2013-04-21 DIAGNOSIS — R262 Difficulty in walking, not elsewhere classified: Secondary | ICD-10-CM | POA: Insufficient documentation

## 2013-04-21 DIAGNOSIS — IMO0001 Reserved for inherently not codable concepts without codable children: Secondary | ICD-10-CM | POA: Insufficient documentation

## 2013-04-23 ENCOUNTER — Ambulatory Visit: Payer: BC Managed Care – PPO | Admitting: Occupational Therapy

## 2013-04-23 ENCOUNTER — Ambulatory Visit: Payer: BC Managed Care – PPO | Admitting: Physical Therapy

## 2013-04-28 ENCOUNTER — Ambulatory Visit: Payer: BC Managed Care – PPO | Admitting: Physical Therapy

## 2013-04-28 ENCOUNTER — Ambulatory Visit: Payer: BC Managed Care – PPO | Admitting: Occupational Therapy

## 2013-05-01 ENCOUNTER — Ambulatory Visit: Payer: BC Managed Care – PPO | Admitting: Occupational Therapy

## 2013-05-01 ENCOUNTER — Ambulatory Visit: Payer: BC Managed Care – PPO | Admitting: Physical Therapy

## 2013-05-05 ENCOUNTER — Ambulatory Visit: Payer: BC Managed Care – PPO | Admitting: Physical Therapy

## 2013-05-05 ENCOUNTER — Ambulatory Visit: Payer: BC Managed Care – PPO | Admitting: Occupational Therapy

## 2013-05-07 ENCOUNTER — Encounter: Payer: Self-pay | Admitting: Physician Assistant

## 2013-05-07 ENCOUNTER — Ambulatory Visit (INDEPENDENT_AMBULATORY_CARE_PROVIDER_SITE_OTHER): Payer: BC Managed Care – PPO | Admitting: Physician Assistant

## 2013-05-07 VITALS — BP 122/90 | HR 126 | Temp 98.6°F | Resp 16 | Ht 60.5 in | Wt 166.2 lb

## 2013-05-07 DIAGNOSIS — E785 Hyperlipidemia, unspecified: Secondary | ICD-10-CM

## 2013-05-07 DIAGNOSIS — Z79899 Other long term (current) drug therapy: Secondary | ICD-10-CM

## 2013-05-07 DIAGNOSIS — E119 Type 2 diabetes mellitus without complications: Secondary | ICD-10-CM

## 2013-05-07 DIAGNOSIS — I10 Essential (primary) hypertension: Secondary | ICD-10-CM

## 2013-05-07 LAB — GLUCOSE, POCT (MANUAL RESULT ENTRY): POC Glucose: 137 mg/dl — AB (ref 70–99)

## 2013-05-07 LAB — LIPID PANEL
Cholesterol: 134 mg/dL (ref 0–200)
HDL: 58 mg/dL (ref >39)
LDL Cholesterol: 53 mg/dL (ref 0–99)
Total CHOL/HDL Ratio: 2.3 Ratio
Triglycerides: 117 mg/dL (ref <150)
VLDL: 23 mg/dL (ref 0–40)

## 2013-05-07 LAB — COMPREHENSIVE METABOLIC PANEL
ALT: 77 U/L — ABNORMAL HIGH (ref 0–35)
AST: 42 U/L — ABNORMAL HIGH (ref 0–37)
Albumin: 4 g/dL (ref 3.5–5.2)
Alkaline Phosphatase: 50 U/L (ref 39–117)
BUN: 13 mg/dL (ref 6–23)
CO2: 26 mEq/L (ref 19–32)
Calcium: 9.5 mg/dL (ref 8.4–10.5)
Chloride: 107 mEq/L (ref 96–112)
Creat: 0.74 mg/dL (ref 0.50–1.10)
Glucose, Bld: 131 mg/dL — ABNORMAL HIGH (ref 70–99)
Potassium: 5 mEq/L (ref 3.5–5.3)
Sodium: 140 mEq/L (ref 135–145)
Total Bilirubin: 1 mg/dL (ref 0.2–1.2)
Total Protein: 7.4 g/dL (ref 6.0–8.3)

## 2013-05-07 LAB — POCT GLYCOSYLATED HEMOGLOBIN (HGB A1C): Hemoglobin A1C: 7.3

## 2013-05-07 NOTE — Progress Notes (Signed)
I have examined this patient along with the student and agree.  Return in about 3 months (around 08/05/2013) for re-evaluation of diabetes and blood pressure.

## 2013-05-07 NOTE — Patient Instructions (Signed)
Keep up the great work!  Your blood sugar has improved very nicely. Keep your upcoming appointments with the specialists.

## 2013-05-07 NOTE — Progress Notes (Signed)
   Subjective:    Patient ID: [name], female    DOB: 03/17/1976, 38 y.o.   MRN: [id number]  HPI  Patient presents for follow up of DM and HTN. Brother is translating.  Takes Lantus every morning and is tolerating it well. Feels DM is better controlled now. Checking glucose 3 times per week, after eating breakfast and taking insulin. Reports readings are about 98.  No episodes of hypoglycemia. She only went to nutritionist one time - advised her to continue current, healthy diet.  Tomorrow is the last day for PT due to improvement - she plans to exercise daily at home. PT gave her ankle brace for support of ankle and to improve walking ability.   Continues to take lisinopril daily for HTN. PT has been checking BP which "has been normal". She does not have a way to check BP at home.  Reports occasional headaches but no dizziness and lightheadedness.   No plans to return to work anytime soon - would like to, but is unable at this time due to inability to drive and lack of control of her hands. Still has trouble with numbness in feet and difficulty walking.   Reports intermittent RUQ abdominal pain with nausea. Food does not make pain worse or better.   Overall, reports improvement in health and feeling happy with her health. She is able to bathe herself, cook and feed herself. Still having trouble with stairs and walking long distance. Neurology appointment on 05/14/13. Recently tested positive for Hep C and has an appointment with specialist 05/28/13.   Review of Systems     Objective:   Physical Exam  Constitutional: She is oriented to person, place, and time. She appears well-developed and well-nourished.  HENT:  Head: Normocephalic and atraumatic.  Eyes: Conjunctivae are normal.  Neck: Neck supple.  Cardiovascular: Normal rate, regular rhythm, normal heart sounds and intact distal pulses.   Pulmonary/Chest: Effort normal and breath sounds normal.  Abdominal: Soft. Normal appearance  and bowel sounds are normal. There is no hepatomegaly. There is no tenderness. There is no rigidity, no guarding and negative Murphy's sign.  Lymphadenopathy:       Head (right side): No submental, no submandibular, no tonsillar, no preauricular and no posterior auricular adenopathy present.       Head (left side): No submental, no submandibular, no tonsillar, no preauricular and no posterior auricular adenopathy present.    She has no cervical adenopathy.       Right: No supraclavicular adenopathy present.       Left: No supraclavicular adenopathy present.  Neurological: She is alert and oriented to person, place, and time. She has normal strength.  Reflex Scores:      Patellar reflexes are 2+ on the right side and 2+ on the left side. Skin: Skin is warm and dry.  Psychiatric: She has a normal mood and affect. Her behavior is normal. Judgment and thought content normal.          Assessment & Plan:    1. DM (diabetes mellitus) Continue medications as prescribed. Continue healthy diet and exercise.  - POCT glucose (manual entry) - POCT glycosylated hemoglobin (Hb A1C) - Comprehensive metabolic panel  2. HTN (hypertension) Continue medication as prescribed.  3. Hyperlipidemia Continue medication prescribed.  - Lipid panel  4. High risk medication use  Advised to attend all upcoming scheduled appointments.

## 2013-05-08 ENCOUNTER — Ambulatory Visit: Payer: BC Managed Care – PPO | Admitting: Physical Therapy

## 2013-05-08 ENCOUNTER — Ambulatory Visit: Payer: BC Managed Care – PPO | Admitting: Occupational Therapy

## 2013-05-14 ENCOUNTER — Ambulatory Visit (HOSPITAL_BASED_OUTPATIENT_CLINIC_OR_DEPARTMENT_OTHER): Payer: BC Managed Care – PPO | Admitting: Physical Medicine & Rehabilitation

## 2013-05-14 ENCOUNTER — Encounter: Payer: BC Managed Care – PPO | Attending: Physical Medicine & Rehabilitation

## 2013-05-14 ENCOUNTER — Encounter: Payer: Self-pay | Admitting: Physical Medicine & Rehabilitation

## 2013-05-14 VITALS — BP 146/86 | HR 111 | Resp 14 | Ht 60.5 in | Wt 165.0 lb

## 2013-05-14 DIAGNOSIS — E119 Type 2 diabetes mellitus without complications: Secondary | ICD-10-CM | POA: Insufficient documentation

## 2013-05-14 DIAGNOSIS — E7800 Pure hypercholesterolemia, unspecified: Secondary | ICD-10-CM | POA: Insufficient documentation

## 2013-05-14 DIAGNOSIS — I10 Essential (primary) hypertension: Secondary | ICD-10-CM | POA: Insufficient documentation

## 2013-05-14 DIAGNOSIS — I69959 Hemiplegia and hemiparesis following unspecified cerebrovascular disease affecting unspecified side: Secondary | ICD-10-CM | POA: Insufficient documentation

## 2013-05-14 DIAGNOSIS — I639 Cerebral infarction, unspecified: Secondary | ICD-10-CM

## 2013-05-14 DIAGNOSIS — I6350 Cerebral infarction due to unspecified occlusion or stenosis of unspecified cerebral artery: Secondary | ICD-10-CM

## 2013-05-14 NOTE — Patient Instructions (Signed)
Followup with the hepatitis specialist  Followup with primary doctor   Followup eye doctor  No need to see me

## 2013-05-14 NOTE — Progress Notes (Signed)
Subjective:    Patient ID: [name], female    DOB: March 30, 1976, 38 y.o.   MRN: [id number] This is a 38 year old non-English speaking  female, history of diabetes mellitus, hypertension, admitted on January 30, 2013, with right-sided weakness and numbness. Systolic blood  pressure 767. Noted blood sugar 307 on admission. MRI of the brain  with acute nonhemorrhagic linear infarct involving the lateral left  thalamus and potentially internal capsule, MRA without stenosis.  Echocardiogram with ejection fraction of 60-65%, no wall motion  abnormalities. Carotid Dopplers without ICA stenosis. Neurology  Service was consulted, placed on aspirin therapy. Physical and  occupational therapy ongoing, patient was admitted for comprehensive  rehab program.  Patient with poor [place] skills. Brother is translating.  HPI Was diagnosed with hepatitis C. Scheduled 05/28/13 followup with hepatitis specialist No falls  Still c/o weak R hand  Complains of a GI's Good sensation on R hand Pain Inventory Average Pain 0 Pain Right Now 0 My pain is n/a  In the last 24 hours, has pain interfered with the following? General activity 0 Relation with others 0 Enjoyment of life 0 What TIME of day is your pain at its worst? n/a Sleep (in general) n/a  Pain is worse with: walking and standing Pain improves with: n/a Relief from Meds: n/a  Mobility walk with assistance ability to climb steps?  no do you drive?  no  Function not employed: date last employed 01/2013  Neuro/Psych numbness trouble walking  Prior Studies Any changes since last visit?  no  Physicians involved in your care Any changes since last visit?  no   Family History  Problem Relation Age of Onset  . Stroke Mother   . Hypertension Mother   . Hyperlipidemia Mother   . Stroke Father   . Hypertension Father   . Hyperlipidemia Father   . Heart disease Father   . Hyperlipidemia Brother    History   Social History    . Marital Status: Single    Spouse Name: n/a    Number of Children: 2  . Years of Education: 6th grade   Occupational History  . nail salon    Social History Main Topics  . Smoking status: Never Smoker   . Smokeless tobacco: Never Used  . Alcohol Use: No  . Drug Use: No  . Sexual Activity: None   Other Topics Concern  . None   Social History Narrative   From [place], though family is [place], came to the [place] in 1999. Lives with her brother, mother, and two children.   Past Surgical History  Procedure Laterality Date  . Cesarean section     Past Medical History  Diagnosis Date  . Hypertension   . Hypercholesteremia   . Diabetes mellitus without complication   . Acute ischemic stroke 01/31/2013   BP 146/86  Pulse 111  Resp 14  Ht 5' 0.5" (1.537 m)  Wt 165 lb (74.844 kg)  BMI 31.68 kg/m2  SpO2 98%  LMP 04/30/2013  Opioid Risk Score:   Fall Risk Score: Low Fall Risk (0-5 points) (Patient educated handout given)   Review of Systems  Musculoskeletal: Positive for gait problem.  Neurological: Positive for numbness.       Objective:   Physical Exam 5-/5 strength in the right deltoid, bicep, tricep, grip, hip flexor, knee extensors, ankle dorsiflexor and plantar flexor  5/5 strength in the left side in the upper and lower limb.  Sensation equal to light touch in  both upper   Ambulates without a walker no evidence of toe drag her knee instability. Gait appears normal        Assessment & Plan:  1. Right hemiparesis improving after left CVA. Per brothers report, Has completed therapy. Would recommend continuing home exercise program  . Brother reports patient's mental status seems about normal. . I feel she is okay to drive that she does not feel confident about this yet I also okay to her for return to work. She has nearly normal neurological evaluation. She does not feel confident about her abilities at this point however.  No physical medicine and  rehabilitation followup needed Followup with primary care Followup gastroenterology Followup with ophthalmology

## 2013-05-15 DIAGNOSIS — Z0271 Encounter for disability determination: Secondary | ICD-10-CM

## 2013-05-28 ENCOUNTER — Other Ambulatory Visit: Payer: Self-pay | Admitting: Internal Medicine

## 2013-05-28 DIAGNOSIS — C220 Liver cell carcinoma: Secondary | ICD-10-CM

## 2013-06-01 ENCOUNTER — Ambulatory Visit
Admission: RE | Admit: 2013-06-01 | Discharge: 2013-06-01 | Disposition: A | Discharge status: Home / Self Care | Payer: BC Managed Care – PPO | Source: Ambulatory Visit | Attending: Internal Medicine | Admitting: Internal Medicine

## 2013-06-01 DIAGNOSIS — C220 Liver cell carcinoma: Secondary | ICD-10-CM

## 2013-06-18 ENCOUNTER — Encounter: Payer: Self-pay | Admitting: Nurse Practitioner

## 2013-06-18 ENCOUNTER — Ambulatory Visit (INDEPENDENT_AMBULATORY_CARE_PROVIDER_SITE_OTHER): Payer: BC Managed Care – PPO | Admitting: Nurse Practitioner

## 2013-06-18 ENCOUNTER — Encounter (INDEPENDENT_AMBULATORY_CARE_PROVIDER_SITE_OTHER): Payer: Self-pay

## 2013-06-18 VITALS — BP 110/72 | HR 96 | Ht 61.0 in | Wt 157.0 lb

## 2013-06-18 DIAGNOSIS — R209 Unspecified disturbances of skin sensation: Secondary | ICD-10-CM

## 2013-06-18 DIAGNOSIS — R200 Anesthesia of skin: Secondary | ICD-10-CM

## 2013-06-18 DIAGNOSIS — M6281 Muscle weakness (generalized): Secondary | ICD-10-CM

## 2013-06-18 DIAGNOSIS — R531 Weakness: Secondary | ICD-10-CM

## 2013-06-18 NOTE — Patient Instructions (Addendum)
PLAN: Continue clopidogrel 75 mg orally every day for secondary stroke prevention.  For pain, you may take 3 tablets of Gabapentin every 6 hours.  Please be sure to keep appointment and follow up with hepatitis specialist; avoid Tylenol and products containing acetaminophen.  Maintain strict control of hypertension with blood pressure goal below 140/90, diabetes with hemoglobin A1c goal below 7% and lipids with LDL cholesterol goal below 70 mg/dL.   Followup in the future with me in 6 months.

## 2013-06-18 NOTE — Progress Notes (Signed)
PATIENT: [name] DOB: [birth year]  REASON FOR VISIT: follow up for stroke HISTORY FROM: patient  HISTORY OF PRESENT ILLNESS: [name] is a 38 y.o. female, right handed, with a past medical history significant for HTN, DM, and hypercholesterolemia, brought to [facility] ED 01/30/2013 by EMS due to acute onset right sided weakness-numbness. Never had similar symptoms before. She was last seen normal by family at 1 am that day. Then, she went to the bathroom and complained of having weakness and numbness of the right side. Went to bed by approximately 15 minutes expressed to her family that she needed to go to the hospital. SBP 200 and CBG 307 when first evaluated by EMS. No reported spurred speech, vertigo, double vision, difficulty swallowing, imbalance, confusion, language or vision impairment. She did have a HA at home. Upon arrival to ED initial NIHSS 3. CT brain showed no acute intracranial abnormality. MRI of the brain with acute nonhemorrhagic linear infarct involving the lateral left thalamus and potentially internal capsule, MRA without stenosis. Echocardiogram with ejection fraction of 60-65%, no wall motion abnormalities. Carotid Dopplers without ICA stenosis. She is tolerating aspirin therapy. Physical and occupational therapy ongoing, patient was admitted for comprehensive rehab program. Patient with poor [place] skills. Brother is translating. Per brothers report she has almost completed PT,OT, SLP. She is still weak on the right side, and is taking Gabapentin for neuropathic pain in the right arm and leg. She cannot stand a long time without pain in the right leg. She is ambulating with a walker. Dressing and bathing herself independently. Not working. Not driving. Since discharge to the hospital has followed up with primary care doctor. Was diagnosed with hepatitis C. Recommendations are to followup with hepatitis specialist, she has not had this appointment yet.   UPDATE 06/18/13 (LL):  She  returns for follow up visit, has done well since last visit.  Still has mild sensory deficit on right arm and right leg, with mild weakness on the left. Her blood pressure is well controlled, it is 110/72 in office today.  She has seen an infectious disease specialist and had lab work for [employer] treatment and has follow up there in 2 weeks. She is tolerating plavix well with mid bruising on arms and legs.  REVIEW OF SYSTEMS: Full 14 system review of systems performed and notable only for: weakness, pain in right leg, eye itching and eye redness  ALLERGIES: No Known Allergies  HOME MEDICATIONS: Outpatient Prescriptions Prior to Visit  Medication Sig Dispense Refill  . atorvastatin (LIPITOR) 40 MG tablet Take 1 tablet (40 mg total) by mouth daily at 6 PM.  90 tablet  3  . clopidogrel (PLAVIX) 75 MG tablet Take 1 tablet (75 mg total) by mouth daily.  90 tablet  3  . gabapentin (NEURONTIN) 100 MG capsule Take 3 capsules (300 mg total) by mouth 3 (three) times daily.  270 capsule  3  . insulin glargine (LANTUS) 100 UNIT/ML injection Inject 0.2 mLs (20 Units total) into the skin daily.  3 pen  12  . lisinopril (PRINIVIL,ZESTRIL) 5 MG tablet Take 1 tablet (5 mg total) by mouth daily.  90 tablet  1   No facility-administered medications prior to visit.   PHYSICAL EXAM  Filed Vitals:   06/18/13 1509  BP: 110/72  Pulse: 96  Height: 5' 1"  (1.549 m)  Weight: 157 lb (71.215 kg)   Body mass index is 29.68 kg/(m^2).  Generalized: Well developed, in no acute distress  Head:  normocephalic and atraumatic. Oropharynx benign  Neck: Supple, no carotid bruits  Cardiac: Regular rate rhythm, no murmur  Musculoskeletal: No deformity   Neurological examination  Mentation: Alert oriented to time, place, history taking. Follows all commands speech and language fluent  Cranial nerve II-XII: Pupils were equal round reactive to light extraocular movements were full, visual field were full on confrontational  test. Facial sensation and strength were normal. hearing was intact to finger rubbing bilaterally. Uvula tongue midline. head turning and shoulder shrug and were normal and symmetric.  Motor: 4+/5 strength in the right deltoid, bicep, tricep, grip, hip flexor, knee extensors, ankle dorsiflexor and plantar flexor, 5/5 strength in the left side in the upper and lower limb. Fine finger movements are decreased on the right, no pronator drift.  Sensory: decreased to light touch, pinprick on the right arm and leg, normal on the left.  Coordination: finger-nose-finger, heel-to-shin bilaterally, mild dysmetria on the right.  Reflexes: Deep tendon reflexes in the upper and lower extremities are present and symmetric.  Gait and Station: Rising up from seated position without assistance, right hemiplegic gait. Unable to perform tiptoe, and heel walking without difficulty. Romberg negative.   ASSESSMENT AND PLAN Ms. [name] is a 38 y.o. female presenting with right hemiparesis and hemisensory deficits on 01/30/13. An MRI showed acute nonhemorrhagic linear infarcts involving the lateral left thalamus and potentially internal capsule. Infarcts felt to be likely small vessel disease. Patient with resultant right hemiparesis and right hemisensory deficits.   PLAN: Start clopidogrel 75 mg orally every day for secondary stroke prevention.  For pain, you may take 3 tablets of Gabapentin every 6 hours.  Please be sure to keep appointment and follow up with hepatitis specialist; avoid Tylenol and products containing acetaminophen.  Maintain strict control of hypertension with blood pressure goal below 140/90, diabetes with hemoglobin A1c goal below 7% and lipids with LDL cholesterol goal below 70 mg/dL. Followup in the future with me in 6 months.  Philmore Pali, MSN, NP-C 06/18/2013, 8:06 PM [facility] [address] [place], [place] [address] [phone]  Note: This document was prepared  with digital dictation and possible smart phrase technology. Any transcriptional errors that result from this process are unintentional.

## 2013-07-21 ENCOUNTER — Encounter: Payer: Self-pay | Admitting: Physician Assistant

## 2013-07-21 DIAGNOSIS — R768 Other specified abnormal immunological findings in serum: Secondary | ICD-10-CM

## 2013-08-11 ENCOUNTER — Telehealth: Payer: Self-pay

## 2013-08-11 ENCOUNTER — Ambulatory Visit: Payer: BC Managed Care – PPO | Admitting: Physician Assistant

## 2013-08-11 NOTE — Telephone Encounter (Signed)
Spoke to SW.  Advised that she contact the drug company because they have programs for indigent patients and they may be able to send her some.

## 2013-08-11 NOTE — Telephone Encounter (Signed)
I do not know of any other specific help available.  However, the SW could contact the drug company.  They have a program for indigent patients.  They may be able to send her some.  If they can't, we could use a generic alternative insulin, though she'd have to use it 2-3 times daily, and it's a definite second choice. If we go this route, I'd select [facility]PH insulin.
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
## 2013-08-17 MED ORDER — INSULIN GLARGINE 100 UNIT/ML SOLOSTAR PEN: 20.0000 [IU] | PEN_INJECTOR | Freq: Every day | SUBCUTANEOUS | Status: DC

## 2013-08-17 NOTE — Telephone Encounter (Signed)
Spoke to patient's brother and advised him that RX was sent to pharmacy.  He was very grateful.

## 2013-08-17 NOTE — Telephone Encounter (Signed)
Spoke to patient's brother. There is a language barrier.  He states she needsLantus pen and was increased to 20 units by [name]. They said if they have a new RX they will not have to pay the big amount of money. Please send to pharmacy.

## 2013-08-17 NOTE — Telephone Encounter (Signed)
Sent to the pharmacy

## 2013-08-17 NOTE — Telephone Encounter (Signed)
15 U of what - I am happy to write her some more but I thought that cost as an issue -

## 2013-08-17 NOTE — Telephone Encounter (Signed)
Patient is out of insulin,  Hospital put her on 15 mg.  Needs [name] to write a new prescription.  [phone]

## 2013-09-01 ENCOUNTER — Ambulatory Visit: Payer: BC Managed Care – PPO | Admitting: Physician Assistant

## 2013-09-06 ENCOUNTER — Ambulatory Visit (INDEPENDENT_AMBULATORY_CARE_PROVIDER_SITE_OTHER): Payer: BC Managed Care – PPO | Admitting: Physician Assistant

## 2013-09-06 VITALS — BP 120/90 | HR 88 | Temp 98.0°F | Ht 61.0 in | Wt 159.0 lb

## 2013-09-06 DIAGNOSIS — I6350 Cerebral infarction due to unspecified occlusion or stenosis of unspecified cerebral artery: Secondary | ICD-10-CM

## 2013-09-06 DIAGNOSIS — E785 Hyperlipidemia, unspecified: Secondary | ICD-10-CM

## 2013-09-06 DIAGNOSIS — I10 Essential (primary) hypertension: Secondary | ICD-10-CM

## 2013-09-06 DIAGNOSIS — I639 Cerebral infarction, unspecified: Secondary | ICD-10-CM

## 2013-09-06 DIAGNOSIS — R768 Other specified abnormal immunological findings in serum: Secondary | ICD-10-CM

## 2013-09-06 DIAGNOSIS — Z23 Encounter for immunization: Secondary | ICD-10-CM

## 2013-09-06 DIAGNOSIS — E119 Type 2 diabetes mellitus without complications: Secondary | ICD-10-CM

## 2013-09-06 DIAGNOSIS — R531 Weakness: Secondary | ICD-10-CM

## 2013-09-06 DIAGNOSIS — R109 Unspecified abdominal pain: Secondary | ICD-10-CM

## 2013-09-06 DIAGNOSIS — R894 Abnormal immunological findings in specimens from other organs, systems and tissues: Secondary | ICD-10-CM

## 2013-09-06 DIAGNOSIS — H579 Unspecified disorder of eye and adnexa: Secondary | ICD-10-CM

## 2013-09-06 DIAGNOSIS — M6281 Muscle weakness (generalized): Secondary | ICD-10-CM

## 2013-09-06 LAB — LIPID PANEL
Cholesterol: 113 mg/dL (ref 0–200)
HDL: 54 mg/dL (ref >39)
LDL Cholesterol: 42 mg/dL (ref 0–99)
Total CHOL/HDL Ratio: 2.1 Ratio
Triglycerides: 83 mg/dL (ref <150)
VLDL: 17 mg/dL (ref 0–40)

## 2013-09-06 LAB — COMPREHENSIVE METABOLIC PANEL
ALT: 24 U/L (ref 0–35)
AST: 17 U/L (ref 0–37)
Albumin: 4.1 g/dL (ref 3.5–5.2)
Alkaline Phosphatase: 47 U/L (ref 39–117)
BUN: 12 mg/dL (ref 6–23)
CO2: 27 mEq/L (ref 19–32)
Calcium: 9.7 mg/dL (ref 8.4–10.5)
Chloride: 104 mEq/L (ref 96–112)
Creat: 0.65 mg/dL (ref 0.50–1.10)
Glucose, Bld: 87 mg/dL (ref 70–99)
Potassium: 4.1 mEq/L (ref 3.5–5.3)
Sodium: 140 mEq/L (ref 135–145)
Total Bilirubin: 0.6 mg/dL (ref 0.2–1.2)
Total Protein: 7 g/dL (ref 6.0–8.3)

## 2013-09-06 LAB — POCT CBC
Granulocyte percent: 46.8 %G (ref 37–80)
HCT, POC: 35.8 % — AB (ref 37.7–47.9)
Hemoglobin: 11 g/dL — AB (ref 12.2–16.2)
Lymph, poc: 2.8 (ref 0.6–3.4)
MCH, POC: 26.2 pg — AB (ref 27–31.2)
MCHC: 30.7 g/dL — AB (ref 31.8–35.4)
MCV: 85.2 fL (ref 80–97)
MID (cbc): 0.5 (ref 0–0.9)
MPV: 8.3 fL (ref 0–99.8)
POC Granulocyte: 2.9 (ref 2–6.9)
POC LYMPH PERCENT: 45.4 %L (ref 10–50)
POC MID %: 7.8 %M (ref 0–12)
Platelet Count, POC: 376 10*3/uL (ref 142–424)
RBC: 4.2 M/uL (ref 4.04–5.48)
RDW, POC: 16.8 %
WBC: 6.1 10*3/uL (ref 4.6–10.2)

## 2013-09-06 LAB — GLUCOSE, POCT (MANUAL RESULT ENTRY): POC Glucose: 91 mg/dl (ref 70–99)

## 2013-09-06 LAB — POCT GLYCOSYLATED HEMOGLOBIN (HGB A1C): Hemoglobin A1C: 5

## 2013-09-06 MED ORDER — GABAPENTIN 100 MG PO CAPS: 300.0000 mg | ORAL_CAPSULE | Freq: Three times a day (TID) | ORAL | Status: DC

## 2013-09-06 MED ORDER — FEXOFENADINE HCL 180 MG PO TABS: 180.0000 mg | ORAL_TABLET | Freq: Every day | ORAL | Status: DC

## 2013-09-06 NOTE — Progress Notes (Signed)
Subjective:    Patient ID: [name], female    DOB: 1976-03-11, 38 y.o.   MRN: [id number]   PCP: Pranit Owensby, PA-C  Chief Complaint  Patient presents with  . Medication Refill    Medications, allergies, past medical history, surgical history, family history, social history and problem list reviewed and updated.  Patient Active Problem List   Diagnosis Date Noted  . Hyperlipidemia 03/03/2013  . HCV antibody positive 03/03/2013  . CVA (cerebral infarction) 02/04/2013  . Acute ischemic stroke 01/31/2013  . Hypokalemia 01/31/2013  . Right sided weakness 01/30/2013  . HTN (hypertension) 01/30/2013  . DM (diabetes mellitus) 01/30/2013    Prior to Admission medications   Medication Sig Start Date End Date Taking? Authorizing Provider  atorvastatin (LIPITOR) 40 MG tablet Take 1 tablet (40 mg total) by mouth daily at 6 PM. 03/25/13  Yes Kemarion Abbey S Tiaja Hagan, PA-C  clopidogrel (PLAVIX) 75 MG tablet Take 1 tablet (75 mg total) by mouth daily. 03/25/13  Yes Chloee Tena S Rosealyn Little, PA-C  gabapentin (NEURONTIN) 100 MG capsule Take 3 capsules (300 mg total) by mouth 3 (three) times daily. 03/18/13  Yes Philmore Pali, NP  HARVONI 90-400 MG TABS  08/13/13  Yes Historical Provider, MD  Insulin Glargine (LANTUS SOLOSTAR) 100 UNIT/ML Solostar Pen Inject 20 Units into the skin daily at 10 pm. 08/17/13  Yes Mancel Bale, PA-C  lisinopril (PRINIVIL,ZESTRIL) 5 MG tablet Take 1 tablet (5 mg total) by mouth daily. 03/03/13  Yes Raeanne Deschler Janalee Dane, PA-C    HPI Presents for re-evaluation of DM, HTN and hyperlipidemia.  Last here in January. Accompanied by her brother, who provides translation.  She continues to do well.  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  Her children, ages 43 and 3, are finishing the school year.  The brother, with whom she and her children,  and her mother, all live, will be travelling this summer.  Another brother will come to care for them.  Frequency of home glucose monitoring: 3 x/week, 95-110 Unable to afford dental or eye care regularly. Checks feet daily. Is current with influenza vaccine. Is current with pneumococcal vaccine.   Review of Systems Having some allergy symptoms-itchy eyes, watery eyes. OTC eye drops without benefit. She's been having continued abdominal pain.  Generally it's vague and mild, but yesterday had worse pain than she's had before.  It's back to normal today, and associated with some nausea.  It's generalized, not better/worse with eating, BMs, movement, rest.    Objective:   Physical Exam  Constitutional: She is oriented to person, place, and time. She appears well-developed and well-nourished. No distress.  BP 120/90  Pulse 88  Temp(Src) 98 F (36.7 C) (Oral)  Ht 5' 1"  (1.549 m)  Wt 159 lb (72.122 kg)  BMI 30.06 kg/m2  SpO2 100%  LMP 09/03/2013   Eyes: Conjunctivae are normal. No scleral icterus.  Neck: Neck supple. No thyromegaly present.  Cardiovascular: Normal rate, regular rhythm and normal heart sounds.   Pulmonary/Chest: Effort normal and breath sounds normal.  Abdominal: Soft. Bowel sounds are normal. She exhibits no distension and no mass. There is tenderness (mild, generalized/diffuse). There is no rebound and no guarding.  Musculoskeletal:  RIGHT sided weakness of the upper and lower extremities.  Reduced sensation to monofilament testing on the RIGHT foot.  Lymphadenopathy:  She has no cervical adenopathy.  Neurological: She is alert and oriented to person, place, and time.  Skin: Skin is warm and dry. No rash noted.  Psychiatric: She has a normal mood and affect. Her behavior is normal.      Results for orders placed in visit on 09/06/13  GLUCOSE, POCT (MANUAL RESULT ENTRY)      Result Value Ref Range   POC Glucose 91  70 - 99 mg/dl  POCT GLYCOSYLATED HEMOGLOBIN  (HGB A1C)      Result Value Ref Range   Hemoglobin A1C 5.0    POCT CBC      Result Value Ref Range   WBC 6.1  4.6 - 10.2 K/uL   Lymph, poc 2.8  0.6 - 3.4   POC LYMPH PERCENT 45.4  10 - 50 %L   MID (cbc) 0.5  0 - 0.9   POC MID % 7.8  0 - 12 %M   POC Granulocyte 2.9  2 - 6.9   Granulocyte percent 46.8  37 - 80 %G   RBC 4.20  4.04 - 5.48 M/uL   Hemoglobin 11.0 (*) 12.2 - 16.2 g/dL   HCT, POC 35.8 (*) 37.7 - 47.9 %   MCV 85.2  80 - 97 fL   MCH, POC 26.2 (*) 27 - 31.2 pg   MCHC 30.7 (*) 31.8 - 35.4 g/dL   RDW, POC 16.8     Platelet Count, POC 376  142 - 424 K/uL   MPV 8.3  0 - 99.8 fL       Assessment & Plan:  1. DM (diabetes mellitus) Extremely well controlled. If she begins having FBS < 90 regularly, we will reduce the Lantus dose.  Continue healthy eating habits and exercise. - POCT glucose (manual entry) - POCT glycosylated hemoglobin (Hb A1C) - Comprehensive metabolic panel  2. HTN (hypertension) Controlled, but would like DBP lower.  Continue current treatment for now. - POCT CBC  3. Hyperlipidemia Await lab results. - Lipid panel  4. HCV antibody positive Continue Harvoni, per Hepatitis specialists.  She anticipates 4 additional weeks of treatment, to complete a total of 12 weeks of therapy.  5. Right sided weakness 6. CVA (cerebral infarction) Continue with home exercises and current treatment with Neurontin and Plavix. Follow-up with neurology as planned in 12/2013. - gabapentin (NEURONTIN) 100 MG capsule; Take 3 capsules (300 mg total) by mouth 3 (three) times daily.  Dispense: 270 capsule; Refill: 3  7. Need for Tdap vaccination - Tdap vaccine greater than or equal to 7yo IM   8. Allergic eye reaction If oral antihistamine is not effective, let me know.  Will try a prescription allergy eye drop. - fexofenadine (ALLEGRA) 180 MG tablet; Take 1 tablet (180 mg total) by mouth daily. For allergies.  Dispense: 90 tablet; Refill: 3  9. Abdominal pain Unclear  etiology.  Possibly Harvoni treatment. If symptoms worsen again, or persist, consider CT abd/pelvis.  [place] limited abdomen was normal in 05/2013.  Return in about 3 months (around 12/07/2013), or if symptoms worsen or fail to improve.  Fara Chute, PA-C Physician Assistant-Certified [facility]

## 2013-09-06 NOTE — Patient Instructions (Addendum)
Your glucose is VERY well controlled. If you begin having readings below 90, we'll need to lower the insulin dose. If the itchy, watery eyes continue with the Allegra (fexofenadine), let me know-we can try a prescription eye drop.  I will contact you with your lab results as soon as they are available.   If you have not heard from me in 2 weeks, please contact me.  The fastest way to get your results is to register for My Chart (see the instructions on the last page of this printout).

## 2013-09-08 ENCOUNTER — Encounter: Payer: Self-pay | Admitting: Physician Assistant

## 2013-09-29 ENCOUNTER — Encounter: Payer: Self-pay | Admitting: Physician Assistant

## 2013-09-29 DIAGNOSIS — R768 Other specified abnormal immunological findings in serum: Secondary | ICD-10-CM

## 2013-10-05 ENCOUNTER — Encounter: Payer: Self-pay | Admitting: Physician Assistant

## 2013-10-05 DIAGNOSIS — R768 Other specified abnormal immunological findings in serum: Secondary | ICD-10-CM

## 2013-11-27 ENCOUNTER — Emergency Department (HOSPITAL_COMMUNITY): Payer: BC Managed Care – PPO

## 2013-11-27 ENCOUNTER — Encounter (HOSPITAL_COMMUNITY): Payer: Self-pay | Admitting: Emergency Medicine

## 2013-11-27 ENCOUNTER — Emergency Department (HOSPITAL_COMMUNITY)
Admission: EM | Admit: 2013-11-27 | Discharge: 2013-11-27 | Disposition: A | Discharge status: Home / Self Care | Payer: BC Managed Care – PPO | Attending: Emergency Medicine | Admitting: Emergency Medicine

## 2013-11-27 DIAGNOSIS — I10 Essential (primary) hypertension: Secondary | ICD-10-CM | POA: Insufficient documentation

## 2013-11-27 DIAGNOSIS — E7800 Pure hypercholesterolemia, unspecified: Secondary | ICD-10-CM | POA: Insufficient documentation

## 2013-11-27 DIAGNOSIS — IMO0002 Reserved for concepts with insufficient information to code with codable children: Secondary | ICD-10-CM | POA: Diagnosis not present

## 2013-11-27 DIAGNOSIS — S0993XA Unspecified injury of face, initial encounter: Secondary | ICD-10-CM | POA: Insufficient documentation

## 2013-11-27 DIAGNOSIS — Z7902 Long term (current) use of antithrombotics/antiplatelets: Secondary | ICD-10-CM | POA: Insufficient documentation

## 2013-11-27 DIAGNOSIS — Z79899 Other long term (current) drug therapy: Secondary | ICD-10-CM | POA: Insufficient documentation

## 2013-11-27 DIAGNOSIS — S0990XA Unspecified injury of head, initial encounter: Secondary | ICD-10-CM | POA: Diagnosis not present

## 2013-11-27 DIAGNOSIS — Z794 Long term (current) use of insulin: Secondary | ICD-10-CM | POA: Diagnosis not present

## 2013-11-27 DIAGNOSIS — Y92410 Unspecified street and highway as the place of occurrence of the external cause: Secondary | ICD-10-CM | POA: Diagnosis not present

## 2013-11-27 DIAGNOSIS — Z3202 Encounter for pregnancy test, result negative: Secondary | ICD-10-CM | POA: Insufficient documentation

## 2013-11-27 DIAGNOSIS — E119 Type 2 diabetes mellitus without complications: Secondary | ICD-10-CM | POA: Insufficient documentation

## 2013-11-27 DIAGNOSIS — Y9389 Activity, other specified: Secondary | ICD-10-CM | POA: Diagnosis not present

## 2013-11-27 DIAGNOSIS — S199XXA Unspecified injury of neck, initial encounter: Secondary | ICD-10-CM | POA: Diagnosis not present

## 2013-11-27 DIAGNOSIS — Z8673 Personal history of transient ischemic attack (TIA), and cerebral infarction without residual deficits: Secondary | ICD-10-CM | POA: Insufficient documentation

## 2013-11-27 LAB — POC URINE PREG, ED: Preg Test, Ur: NEGATIVE

## 2013-11-27 MED ORDER — HYDROCODONE-ACETAMINOPHEN 5-325 MG PO TABS
1.0000 | ORAL_TABLET | Freq: Once | ORAL | Status: AC
  Administered 2013-11-27: 1 via ORAL
  Filled 2013-11-27: qty 1

## 2013-11-27 MED ORDER — METHOCARBAMOL 500 MG PO TABS: 500.0000 mg | ORAL_TABLET | Freq: Two times a day (BID) | ORAL | Status: DC

## 2013-11-27 MED ORDER — HYDROCODONE-ACETAMINOPHEN 5-325 MG PO TABS
1.0000 | ORAL_TABLET | Freq: Four times a day (QID) | ORAL | Status: DC | PRN
Start: 2013-11-27 — End: 2014-09-30

## 2013-11-27 NOTE — ED Notes (Signed)
TRANSLATOR PHONE NOT USED IN TRIAGE- PA fluent in this patients dialect
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
## 2013-11-27 NOTE — ED Notes (Signed)
Bed: WA02 Expected date:  Expected time:  Means of arrival:  Comments:
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
## 2013-11-27 NOTE — ED Provider Notes (Signed)
Medical screening examination/treatment/procedure(s) were performed by non-physician practitioner and as supervising physician I was immediately available for consultation/collaboration.   EKG Interpretation None        Pamella Pert, MD 11/27/13 1710

## 2013-12-23 ENCOUNTER — Ambulatory Visit: Payer: BC Managed Care – PPO | Admitting: Nurse Practitioner

## 2014-01-04 ENCOUNTER — Telehealth: Payer: Self-pay | Admitting: Nurse Practitioner

## 2014-01-04 ENCOUNTER — Ambulatory Visit: Payer: BC Managed Care – PPO | Admitting: Nurse Practitioner

## 2014-01-04 NOTE — Telephone Encounter (Signed)
Patient was no show for today's office appointment.  

## 2014-02-01 ENCOUNTER — Encounter: Payer: Self-pay | Admitting: Physician Assistant

## 2014-02-01 ENCOUNTER — Ambulatory Visit (INDEPENDENT_AMBULATORY_CARE_PROVIDER_SITE_OTHER): Payer: BC Managed Care – PPO | Admitting: Physician Assistant

## 2014-02-01 VITALS — BP 122/74 | HR 105 | Temp 98.0°F | Resp 17 | Ht 60.5 in | Wt 162.0 lb

## 2014-02-01 DIAGNOSIS — I10 Essential (primary) hypertension: Secondary | ICD-10-CM

## 2014-02-01 DIAGNOSIS — B078 Other viral warts: Secondary | ICD-10-CM

## 2014-02-01 DIAGNOSIS — I6350 Cerebral infarction due to unspecified occlusion or stenosis of unspecified cerebral artery: Secondary | ICD-10-CM

## 2014-02-01 DIAGNOSIS — R894 Abnormal immunological findings in specimens from other organs, systems and tissues: Secondary | ICD-10-CM

## 2014-02-01 DIAGNOSIS — E785 Hyperlipidemia, unspecified: Secondary | ICD-10-CM

## 2014-02-01 DIAGNOSIS — Z23 Encounter for immunization: Secondary | ICD-10-CM

## 2014-02-01 DIAGNOSIS — E119 Type 2 diabetes mellitus without complications: Secondary | ICD-10-CM

## 2014-02-01 DIAGNOSIS — B079 Viral wart, unspecified: Secondary | ICD-10-CM

## 2014-02-01 DIAGNOSIS — R768 Other specified abnormal immunological findings in serum: Secondary | ICD-10-CM

## 2014-02-01 LAB — COMPREHENSIVE METABOLIC PANEL
ALT: 20 U/L (ref 0–35)
AST: 16 U/L (ref 0–37)
Albumin: 4.4 g/dL (ref 3.5–5.2)
Alkaline Phosphatase: 54 U/L (ref 39–117)
BUN: 9 mg/dL (ref 6–23)
CO2: 23 mEq/L (ref 19–32)
Calcium: 9.2 mg/dL (ref 8.4–10.5)
Chloride: 108 mEq/L (ref 96–112)
Creat: 0.67 mg/dL (ref 0.50–1.10)
Glucose, Bld: 121 mg/dL — ABNORMAL HIGH (ref 70–99)
Potassium: 3.9 mEq/L (ref 3.5–5.3)
Sodium: 139 mEq/L (ref 135–145)
Total Bilirubin: 0.9 mg/dL (ref 0.2–1.2)
Total Protein: 7.7 g/dL (ref 6.0–8.3)

## 2014-02-01 LAB — POCT CBC
Granulocyte percent: 69 %G (ref 37–80)
HCT, POC: 41 % (ref 37.7–47.9)
Hemoglobin: 13 g/dL (ref 12.2–16.2)
Lymph, poc: 1.7 (ref 0.6–3.4)
MCH, POC: 26.3 pg — AB (ref 27–31.2)
MCHC: 31.8 g/dL (ref 31.8–35.4)
MCV: 82.6 fL (ref 80–97)
MID (cbc): 0.4 (ref 0–0.9)
MPV: 7.7 fL (ref 0–99.8)
POC Granulocyte: 4.8 (ref 2–6.9)
POC LYMPH PERCENT: 24.7 %L (ref 10–50)
POC MID %: 6.3 %M (ref 0–12)
Platelet Count, POC: 344 10*3/uL (ref 142–424)
RBC: 4.96 M/uL (ref 4.04–5.48)
RDW, POC: 14.2 %
WBC: 6.9 10*3/uL (ref 4.6–10.2)

## 2014-02-01 LAB — LIPID PANEL
Cholesterol: 125 mg/dL (ref 0–200)
HDL: 61 mg/dL (ref >39)
LDL Cholesterol: 47 mg/dL (ref 0–99)
Total CHOL/HDL Ratio: 2 Ratio
Triglycerides: 85 mg/dL (ref <150)
VLDL: 17 mg/dL (ref 0–40)

## 2014-02-01 LAB — MICROALBUMIN, URINE: Microalb, Ur: 0.7 mg/dL (ref <2.0)

## 2014-02-01 LAB — GLUCOSE, POCT (MANUAL RESULT ENTRY): POC Glucose: 127 mg/dl — AB (ref 70–99)

## 2014-02-01 LAB — POCT GLYCOSYLATED HEMOGLOBIN (HGB A1C): Hemoglobin A1C: 5.9

## 2014-02-01 MED ORDER — ATORVASTATIN CALCIUM 40 MG PO TABS: 40.0000 mg | ORAL_TABLET | Freq: Every day | ORAL | Status: DC

## 2014-02-01 MED ORDER — CLOPIDOGREL BISULFATE 75 MG PO TABS: 75.0000 mg | ORAL_TABLET | Freq: Every day | ORAL | Status: DC

## 2014-02-01 MED ORDER — LISINOPRIL 5 MG PO TABS: 5.0000 mg | ORAL_TABLET | Freq: Every day | ORAL | Status: DC

## 2014-02-01 NOTE — Patient Instructions (Addendum)
Keep up the GREAT work you are doing!  I will contact you with your lab results as soon as they are available.   If you have not heard from me in 2 weeks, please contact me.  The fastest way to get your results is to register for My Chart (see the instructions on the last page of this printout).  Please schedule an appointment with an eye specialist, and a dentist.  If there are any warts left in 2 weeks, return for additional freezing.

## 2014-02-01 NOTE — Progress Notes (Signed)
Subjective:    Patient ID: [name], female    DOB: [birth year], 38 y.o.   MRN: [id number]   PCP: Leotha Voeltz, PA-C  Chief Complaint  Patient presents with  . Follow-up  . Medication Refill    lisinopril,clopidgrel,lipitor    Medications, allergies, past medical history, surgical history, family history, social history and problem list reviewed and updated.  Patient Active Problem List   Diagnosis Date Noted  . Hyperlipidemia 03/03/2013  . HCV antibody positive 03/03/2013  . Acute ischemic stroke 01/31/2013  . Hypokalemia 01/31/2013  . Right sided weakness 01/30/2013  . HTN (hypertension) 01/30/2013  . DM (diabetes mellitus) 01/30/2013   Prior to Admission medications   Medication Sig Start Date End Date Taking? Authorizing Provider  atorvastatin (LIPITOR) 40 MG tablet Take 1 tablet (40 mg total) by mouth daily at 6 PM. 03/25/13  Yes Nery Frappier S Rage Beever, PA-C  clopidogrel (PLAVIX) 75 MG tablet Take 1 tablet (75 mg total) by mouth daily. 03/25/13  Yes Tatiana Courter S Rohn Fritsch, PA-C  gabapentin (NEURONTIN) 300 MG capsule Take 300 mg by mouth 3 (three) times daily.   Yes Historical Provider, MD  Insulin Glargine (LANTUS SOLOSTAR) 100 UNIT/ML Solostar Pen Inject 20 Units into the skin daily at 10 pm. 08/17/13  Yes Mancel Bale, PA-C  lisinopril (PRINIVIL,ZESTRIL) 5 MG tablet Take 5 mg by mouth daily.   Yes Historical Provider, MD    HPI  This 38 y.o. female presents for evaluation of DM type 2, hyperlipidemia and medication refills. Her brother helps with translation,as usual. She reports feeling really good.  Is back at work. Continues to have some mild weakness on the RIGHT, but is much improved. No longer uses an assistive device for ambulation.  Has completed 12 weeks of [place] treatment for Hepatitis C. Is to follow-up in December to assess for cure.  Checks glucose at home 3 x/week. Usually runs 100-110.  She would like to have several warts on the fingers frozen off.  They  are causing her pain at work. She understands that the treatment and recovery will be painful.  Review of Systems As above. Denies chest pain, shortness of breath, HA, dizziness, vision change, nausea, vomiting, diarrhea, constipation, melena, hematochezia, dysuria, increased urinary urgency or frequency, increased hunger or thirst, unintentional weight change, unexplained myalgias or arthralgias, rash.     Objective:   Physical Exam  Vitals reviewed. Constitutional: She is oriented to person, place, and time. She appears well-developed and well-nourished. She is active and cooperative. No distress.  BP 122/74  Pulse 105  Temp(Src) 98 F (36.7 C) (Oral)  Resp 17  Ht 5' 0.5" (1.537 m)  Wt 162 lb (73.483 kg)  BMI 31.11 kg/m2  SpO2 99%  LMP 01/16/2014   Eyes: Conjunctivae are normal. No scleral icterus.  Neck: Normal range of motion. Neck supple. No thyromegaly present.  Cardiovascular: Normal rate, regular rhythm, normal heart sounds and intact distal pulses.   Pulmonary/Chest: Effort normal and breath sounds normal.  Musculoskeletal: She exhibits no edema.  Lymphadenopathy:    She has no cervical adenopathy.  Neurological: She is alert and oriented to person, place, and time.  Very mild RIGHT sided weakness of upper and lower extremities.  Skin: Skin is warm, dry and intact. Lesion (verrucal lesions noted on the RIGHT thumb, RIGHT index finger and LEFT thumb) noted.  Psychiatric: She has a normal mood and affect. Her behavior is normal.      Results for orders placed in visit on 02/01/14  POCT CBC      Result Value Ref Range   WBC 6.9  4.6 - 10.2 K/uL   Lymph, poc 1.7  0.6 - 3.4   POC LYMPH PERCENT 24.7  10 - 50 %L   MID (cbc) 0.4  0 - 0.9   POC MID % 6.3  0 - 12 %M   POC Granulocyte 4.8  2 - 6.9   Granulocyte percent 69.0  37 - 80 %G   RBC 4.96  4.04 - 5.48 M/uL   Hemoglobin 13.0  12.2 - 16.2 g/dL   HCT, POC 41.0  37.7 - 47.9 %   MCV 82.6  80 - 97 fL   MCH, POC 26.3  (*) 27 - 31.2 pg   MCHC 31.8  31.8 - 35.4 g/dL   RDW, POC 14.2     Platelet Count, POC 344  142 - 424 K/uL   MPV 7.7  0 - 99.8 fL  GLUCOSE, POCT (MANUAL RESULT ENTRY)      Result Value Ref Range   POC Glucose 127 (*) 70 - 99 mg/dl  POCT GLYCOSYLATED HEMOGLOBIN (HGB A1C)      Result Value Ref Range   Hemoglobin A1C 5.9     PROCEDURE: Verbal Consent Obtained. Freeze-thaw-freeze using liquid nitrogen on the 3 verrucal lesions. Tolerated well.    Assessment & Plan:  1. Essential hypertension Controlled. Continue current treatment. - POCT CBC - lisinopril (PRINIVIL,ZESTRIL) 5 MG tablet; Take 1 tablet (5 mg total) by mouth daily.  Dispense: 90 tablet; Refill: 3  2. Type 2 diabetes mellitus without complication Controlled well. Continue current treatment. - POCT glucose (manual entry) - POCT glycosylated hemoglobin (Hb A1C) - Comprehensive metabolic panel - Microalbumin, urine - atorvastatin (LIPITOR) 40 MG tablet; Take 1 tablet (40 mg total) by mouth daily at 6 PM.  Dispense: 90 tablet; Refill: 3  3. Hyperlipidemia Await labs. - Lipid panel  4. HCV antibody positive Follow-up in Hepatitis C clinic in December as planned.  5. Need for influenza vaccination - Flu Vaccine QUAD 36+ mos IM  6. Verruca vulgaris RTC in 2 weeks if any lesions persist. Anticipatory guidance provided. Local wound care.  7. Cerebral infarction due to cerebral artery occlusion Mild RIGHT sided weakness persists. - clopidogrel (PLAVIX) 75 MG tablet; Take 1 tablet (75 mg total) by mouth daily.  Dispense: 90 tablet; Refill: 3   Fara Chute, PA-C Physician Assistant-Certified [facility]

## 2014-04-19 ENCOUNTER — Encounter: Payer: Self-pay | Admitting: Physician Assistant

## 2014-04-19 DIAGNOSIS — R768 Other specified abnormal immunological findings in serum: Secondary | ICD-10-CM

## 2014-04-26 ENCOUNTER — Telehealth: Payer: Self-pay

## 2014-04-26 MED ORDER — INSULIN GLARGINE 100 UNIT/ML SOLOSTAR PEN: 15.0000 [IU] | PEN_INJECTOR | Freq: Every day | SUBCUTANEOUS | Status: DC

## 2014-04-26 MED ORDER — PEN NEEDLES 31G X 6 MM MISC: Status: DC

## 2014-04-26 NOTE — Telephone Encounter (Signed)
Notified brother that Rx was sent (he was here completing paperwork).

## 2014-04-26 NOTE — Telephone Encounter (Signed)
Pt requesting 90 day supply of diabetic meds(pen) needles   Best phone [phone]   [facility] [address]

## 2014-06-14 ENCOUNTER — Ambulatory Visit (INDEPENDENT_AMBULATORY_CARE_PROVIDER_SITE_OTHER): Payer: BLUE CROSS/BLUE SHIELD | Admitting: Emergency Medicine

## 2014-06-14 VITALS — BP 150/100 | HR 95 | Temp 97.8°F | Resp 16 | Ht 60.0 in | Wt 166.4 lb

## 2014-06-14 DIAGNOSIS — E119 Type 2 diabetes mellitus without complications: Secondary | ICD-10-CM

## 2014-06-14 DIAGNOSIS — Z8673 Personal history of transient ischemic attack (TIA), and cerebral infarction without residual deficits: Secondary | ICD-10-CM | POA: Diagnosis not present

## 2014-06-14 DIAGNOSIS — B182 Chronic viral hepatitis C: Secondary | ICD-10-CM

## 2014-06-14 DIAGNOSIS — I10 Essential (primary) hypertension: Secondary | ICD-10-CM

## 2014-06-14 LAB — LIPID PANEL
Cholesterol: 162 mg/dL (ref 0–200)
HDL: 75 mg/dL (ref >=46)
LDL Cholesterol: 67 mg/dL (ref 0–99)
Total CHOL/HDL Ratio: 2.2 Ratio
Triglycerides: 99 mg/dL (ref <150)
VLDL: 20 mg/dL (ref 0–40)

## 2014-06-14 LAB — COMPLETE METABOLIC PANEL WITH GFR
ALT: 14 U/L (ref 0–35)
AST: 11 U/L (ref 0–37)
Albumin: 4 g/dL (ref 3.5–5.2)
Alkaline Phosphatase: 48 U/L (ref 39–117)
BUN: 14 mg/dL (ref 6–23)
CO2: 23 mEq/L (ref 19–32)
Calcium: 9 mg/dL (ref 8.4–10.5)
Chloride: 105 mEq/L (ref 96–112)
Creat: 0.61 mg/dL (ref 0.50–1.10)
GFR, Est African American: >89 mL/min
GFR, Est Non African American: >89 mL/min
Glucose, Bld: 145 mg/dL — ABNORMAL HIGH (ref 70–99)
Potassium: 3.6 mEq/L (ref 3.5–5.3)
Sodium: 138 mEq/L (ref 135–145)
Total Bilirubin: 0.6 mg/dL (ref 0.2–1.2)
Total Protein: 6.9 g/dL (ref 6.0–8.3)

## 2014-06-14 LAB — POCT GLYCOSYLATED HEMOGLOBIN (HGB A1C): Hemoglobin A1C: 7.2

## 2014-06-14 LAB — GLUCOSE, POCT (MANUAL RESULT ENTRY): POC Glucose: 162 mg/dl — AB (ref 70–99)

## 2014-06-14 MED ORDER — LISINOPRIL 10 MG PO TABS: 10.0000 mg | ORAL_TABLET | Freq: Every day | ORAL | Status: DC

## 2014-06-14 NOTE — Progress Notes (Addendum)
Subjective:  This chart was scribed for Lisa Jordan, MD by Mercy Moore, Medial Scribe. This patient was seen in room 10 and the patient's care was started at 10:43 AM.    Patient ID: [name], female    DOB: 09-10-1975, 39 y.o.   MRN: [id number] Chief Complaint  Patient presents with  . Diabetes    3 month follow up  . Hypertension    3 month follow up    HPI HPI Comments: [name] is a 39 y.o. female who presents to the [facility] for a three month Diabetes, Hyperlipidemia and Hypertension follow up. Patient has her medications refilled 04/26/2014. Patient's last office visit was 01/22/2014. Patient is accompanied by a family member who interprets. Patient brings her medications with her and states that she takes them as directed. Patient reports that she has a diary in which she records her sugar: today her blood sugar measured at 99. Patient states she does not see an eye doctor.  Patient shares a family history of Hypertension in her mother and father; she denies family history of Diabetes.  Patient presents with a swollen, painful right ankle. Patient shares injury due a fall sustained two weeks ago; patient inverted her ankle. Patient shares a picture of her ankle after the fall. The bruising to her foot and ankle have resolved. Patient shares that her ankle is improved and she is ambulatory.   Patient reports RLQ abdominal pain, onset 2-3 years ago. Patient has not been examined by GYN or had a pap smear since her second child 12 years ago.   Patient works as a Scientist, forensic.   Patient Active Problem List   Diagnosis Date Noted  . Hyperlipidemia 03/03/2013  . HCV antibody positive 03/03/2013  . CVA (cerebral infarction) 02/04/2013  . Acute ischemic stroke 01/31/2013  . Hypokalemia 01/31/2013  . Right sided weakness 01/30/2013  . HTN (hypertension) 01/30/2013  . DM (diabetes mellitus) 01/30/2013   Past Medical History  Diagnosis Date  .  Hypertension   . Hypercholesteremia   . Diabetes mellitus without complication   . Acute ischemic stroke 01/31/2013   Past Surgical History  Procedure Laterality Date  . Cesarean section     No Known Allergies Prior to Admission medications   Medication Sig Start Date End Date Taking? Authorizing Provider  atorvastatin (LIPITOR) 40 MG tablet Take 1 tablet (40 mg total) by mouth daily at 6 PM. 02/01/14  Yes [name] S Jeffery, PA-C  clopidogrel (PLAVIX) 75 MG tablet Take 1 tablet (75 mg total) by mouth daily. 02/01/14  Yes [name] S Jeffery, PA-C  gabapentin (NEURONTIN) 300 MG capsule Take 300 mg by mouth 3 (three) times daily.   Yes Historical Provider, MD  Insulin Glargine (LANTUS SOLOSTAR) 100 UNIT/ML Solostar Pen Inject 15 Units into the skin daily at 10 pm. 04/26/14  Yes [name] S Jeffery, PA-C  Insulin Pen Needle (PEN NEEDLES) 31G X 6 MM MISC Use daily to inject insulin 04/26/14  Yes [name] S Jeffery, PA-C  lisinopril (PRINIVIL,ZESTRIL) 5 MG tablet Take 1 tablet (5 mg total) by mouth daily. 02/01/14  Yes [name] S Jeffery, PA-C  HYDROcodone-acetaminophen (NORCO/VICODIN) 5-325 MG per tablet Take 1 tablet by mouth every 6 (six) hours as needed for moderate pain or severe pain. Patient not taking: Reported on 06/14/2014 11/27/13   Domenic Moras, PA-C  methocarbamol (ROBAXIN) 500 MG tablet Take 1 tablet (500 mg total) by mouth 2 (two) times daily. Patient not taking: Reported on  06/14/2014 11/27/13   Domenic Moras, PA-C   History   Social History  . Marital Status: Single    Spouse Name: n/a  . Number of Children: 2  . Years of Education: 6th grade   Occupational History  . nail salon    Social History Main Topics  . Smoking status: Never Smoker   . Smokeless tobacco: Never Used  . Alcohol Use: No  . Drug Use: No  . Sexual Activity: Not on file   Other Topics Concern  . Not on file   Social History Narrative   From [place], though family is [place], came to the [place] in 1999. Lives with her  brother, mother, and two children.   Patient is right-handed.   Patient not drinking any caffeine.      Review of Systems  Constitutional: Negative for fever and chills.  Gastrointestinal: Positive for abdominal pain.  Musculoskeletal: Positive for joint swelling.  Skin: Negative for color change.       Objective:   Physical Exam  Vitals reviewed.   CONSTITUTIONAL: Well developed/well nourished HEAD: Normocephalic/atraumatic EYES: EOMI/PERRL ENMT: Mucous membranes moist NECK: supple no meningeal signs SPINE/BACK:entire spine nontender CV: S1/S2 noted, no murmurs/rubs/gallops noted LUNGS: Lungs are clear to auscultation bilaterally, no apparent distress ABDOMEN: soft, nontender, no rebound or guarding, bowel sounds noted throughout abdomen; tenderness deep in right lower abdomen without rebound GU:no cva tenderness NEURO: Pt is awake/alert/appropriate, moves all extremitiesx4.  No facial droop.   EXTREMITIES: pulses normal/equal, full ROM SKIN: warm, color normal PSYCH: no abnormalities of mood noted, alert and oriented to situation  Filed Vitals:   06/14/14 1005  BP: 150/100  Pulse: 95  Temp: 97.8 F (36.6 C)  TempSrc: Oral  Resp: 16  Height: 5' (1.524 m)  Weight: 166 lb 6.4 oz (75.479 kg)  SpO2: 99%   Results for orders placed or performed in visit on 02/01/14  Comprehensive metabolic panel  Result Value Ref Range   Sodium 139 135 - 145 mEq/L   Potassium 3.9 3.5 - 5.3 mEq/L   Chloride 108 96 - 112 mEq/L   CO2 23 19 - 32 mEq/L   Glucose, Bld 121 (H) 70 - 99 mg/dL   BUN 9 6 - 23 mg/dL   Creat 0.67 0.50 - 1.10 mg/dL   Total Bilirubin 0.9 0.2 - 1.2 mg/dL   Alkaline Phosphatase 54 39 - 117 U/L   AST 16 0 - 37 U/L   ALT 20 0 - 35 U/L   Total Protein 7.7 6.0 - 8.3 g/dL   Albumin 4.4 3.5 - 5.2 g/dL   Calcium 9.2 8.4 - 10.5 mg/dL  Lipid panel  Result Value Ref Range   Cholesterol 125 0 - 200 mg/dL   Triglycerides 85 <150 mg/dL   HDL 61 >39 mg/dL   Total  CHOL/HDL Ratio 2.0 Ratio   VLDL 17 0 - 40 mg/dL   LDL Cholesterol 47 0 - 99 mg/dL  Microalbumin, urine  Result Value Ref Range   Microalb, Ur 0.7 <2.0 mg/dL  POCT CBC  Result Value Ref Range   WBC 6.9 4.6 - 10.2 K/uL   Lymph, poc 1.7 0.6 - 3.4   POC LYMPH PERCENT 24.7 10 - 50 %L   MID (cbc) 0.4 0 - 0.9   POC MID % 6.3 0 - 12 %M   POC Granulocyte 4.8 2 - 6.9   Granulocyte percent 69.0 37 - 80 %G   RBC 4.96 4.04 - 5.48 M/uL  Hemoglobin 13.0 12.2 - 16.2 g/dL   HCT, POC 41.0 37.7 - 47.9 %   MCV 82.6 80 - 97 fL   MCH, POC 26.3 (A) 27 - 31.2 pg   MCHC 31.8 31.8 - 35.4 g/dL   RDW, POC 14.2 %   Platelet Count, POC 344 142 - 424 K/uL   MPV 7.7 0 - 99.8 fL  POCT glucose (manual entry)  Result Value Ref Range   POC Glucose 127 (A) 70 - 99 mg/dl  POCT glycosylated hemoglobin (Hb A1C)  Result Value Ref Range   Hemoglobin A1C 5.9    Results for orders placed or performed in visit on 06/14/14  POCT glucose (manual entry)  Result Value Ref Range   POC Glucose 162 (A) 70 - 99 mg/dl  POCT glycosylated hemoglobin (Hb A1C)  Result Value Ref Range   Hemoglobin A1C 7.2        Assessment & Plan:  Her prescription for Lantus is 20 mg but she has only been taking 15. I told her to take the full dose. Her lisinopril was increased from 5 mg a day to 10 mg a day. She will recheck with [name] in 3 months I personally performed the services described in this documentation, which was scribed in my presence. The recorded information has been reviewed and is accurate.

## 2014-06-14 NOTE — Progress Notes (Signed)
Patient ID: [name], female   DOB: 1976-03-05, 39 y.o.   MRN: [id number] Physical Exam  Pulmonary/Chest: Right breast exhibits no inverted nipple, no mass, no nipple discharge, no skin change and no tenderness. Left breast exhibits no inverted nipple, no mass, no nipple discharge, no skin change and no tenderness. Breasts are symmetrical. There is no breast swelling.  Abdominal: Hernia confirmed negative in the right inguinal area and confirmed negative in the left inguinal area.  Genitourinary: Vagina normal. Rectal exam shows no external hemorrhoid, no internal hemorrhoid, no fissure, no mass, no tenderness and anal tone normal. No breast tenderness, discharge or bleeding. Pelvic exam was performed with patient supine. No labial fusion. There is no rash, tenderness, lesion or injury on the right labia. There is no rash, tenderness, lesion or injury on the left labia. Uterus is not deviated, not enlarged, not fixed and not tender. Cervix exhibits no motion tenderness, no discharge and no friability. Right adnexum displays no mass, no tenderness and no fullness. Left adnexum displays tenderness. Left adnexum displays no mass and no fullness.  Lymphadenopathy:       Right: No inguinal adenopathy present.       Left: No inguinal adenopathy present.

## 2014-06-14 NOTE — Progress Notes (Deleted)
   Subjective:    Patient ID: [name], female    DOB: [birth year], 39 y.o.   MRN: [id number]  HPI    Review of Systems     Objective:   Physical Exam        Assessment & Plan:

## 2014-06-15 LAB — PAP IG W/ RFLX HPV ASCU

## 2014-09-30 ENCOUNTER — Ambulatory Visit (INDEPENDENT_AMBULATORY_CARE_PROVIDER_SITE_OTHER): Payer: BLUE CROSS/BLUE SHIELD | Admitting: Physician Assistant

## 2014-09-30 ENCOUNTER — Encounter: Payer: Self-pay | Admitting: Physician Assistant

## 2014-09-30 ENCOUNTER — Ambulatory Visit (INDEPENDENT_AMBULATORY_CARE_PROVIDER_SITE_OTHER): Payer: BLUE CROSS/BLUE SHIELD

## 2014-09-30 VITALS — BP 124/79 | HR 105 | Temp 98.5°F | Resp 16 | Ht 60.0 in | Wt 168.4 lb

## 2014-09-30 DIAGNOSIS — Z114 Encounter for screening for human immunodeficiency virus [HIV]: Secondary | ICD-10-CM

## 2014-09-30 DIAGNOSIS — E785 Hyperlipidemia, unspecified: Secondary | ICD-10-CM | POA: Diagnosis not present

## 2014-09-30 DIAGNOSIS — E119 Type 2 diabetes mellitus without complications: Secondary | ICD-10-CM

## 2014-09-30 DIAGNOSIS — B079 Viral wart, unspecified: Secondary | ICD-10-CM | POA: Diagnosis not present

## 2014-09-30 DIAGNOSIS — B078 Other viral warts: Secondary | ICD-10-CM

## 2014-09-30 DIAGNOSIS — I10 Essential (primary) hypertension: Secondary | ICD-10-CM

## 2014-09-30 DIAGNOSIS — M25522 Pain in left elbow: Secondary | ICD-10-CM

## 2014-09-30 LAB — COMPREHENSIVE METABOLIC PANEL
ALT: 14 U/L (ref 0–35)
AST: 13 U/L (ref 0–37)
Albumin: 3.7 g/dL (ref 3.5–5.2)
Alkaline Phosphatase: 38 U/L — ABNORMAL LOW (ref 39–117)
BUN: 19 mg/dL (ref 6–23)
CO2: 28 mEq/L (ref 19–32)
Calcium: 9.2 mg/dL (ref 8.4–10.5)
Chloride: 103 mEq/L (ref 96–112)
Creat: 0.84 mg/dL (ref 0.50–1.10)
Glucose, Bld: 95 mg/dL (ref 70–99)
Potassium: 4.2 mEq/L (ref 3.5–5.3)
Sodium: 141 mEq/L (ref 135–145)
Total Bilirubin: 0.6 mg/dL (ref 0.2–1.2)
Total Protein: 6.8 g/dL (ref 6.0–8.3)

## 2014-09-30 LAB — LIPID PANEL
Cholesterol: 219 mg/dL — ABNORMAL HIGH (ref 0–200)
HDL: 57 mg/dL (ref >=46)
LDL Cholesterol: 125 mg/dL — ABNORMAL HIGH (ref 0–99)
Total CHOL/HDL Ratio: 3.8 Ratio
Triglycerides: 185 mg/dL — ABNORMAL HIGH (ref <150)
VLDL: 37 mg/dL (ref 0–40)

## 2014-09-30 LAB — GLUCOSE, POCT (MANUAL RESULT ENTRY): POC Glucose: 104 mg/dl — AB (ref 70–99)

## 2014-09-30 LAB — POCT GLYCOSYLATED HEMOGLOBIN (HGB A1C): Hemoglobin A1C: 6.8

## 2014-09-30 MED ORDER — MELOXICAM 15 MG PO TABS: 15.0000 mg | ORAL_TABLET | Freq: Every day | ORAL | Status: DC

## 2014-09-30 NOTE — Progress Notes (Signed)
Patient ID: [name], female    DOB: Nov 26, 1975, 39 y.o.   MRN: [id number]  PCP: Wynne Dust  Subjective:   Chief Complaint  Patient presents with  . Diabetes  . Hyperlipidemia  . Elbow Pain    left elbow    HPI Presents for evaluation of diabetes, HTN and elevated lipids. Usually her brother, [name], is here to translate, but he did not come as scheduled today.  Fortunately, the patient's English is adequate to do what we need today.  Overall she feels well. No problems with her medications, tolerating them without adverse effects. Lisinopril is not in her medication bacg today, and it appears that she is not taking it. At her visit 3 months ago, BP was above goal and the dose was increased from 5 mg to 10 mg.  Working and enjoying it, except that she has developed pain in the LEFT upper back/shoulder/neck area and the LEFT elbow. She denies pain in the neck itself, but movement of the neck can reproduce her pain in the LEFT upper back. Any movement of the elbow, especially twisting and flexion, and especially against resistance, causes pain in the lateral elbow. No weakness. Not dropping things.    Review of Systems  Constitutional: Negative for activity change, appetite change, fatigue and unexpected weight change.  HENT: Negative for congestion, dental problem, ear pain, hearing loss, mouth sores, postnasal drip, rhinorrhea, sneezing, sore throat, tinnitus and trouble swallowing.   Eyes: Negative for photophobia, pain, redness and visual disturbance.  Respiratory: Negative for cough, chest tightness and shortness of breath.   Cardiovascular: Negative for chest pain, palpitations and leg swelling.  Gastrointestinal: Negative for nausea, vomiting, abdominal pain, diarrhea, constipation and blood in stool.  Genitourinary: Negative for dysuria, urgency, frequency and hematuria.  Musculoskeletal: Positive for back pain (LEFT Upper) and arthralgias (LEFT elbow). Negative for  myalgias, gait problem and neck stiffness.  Skin: Negative for rash.       Warts persist, LEFT thumb and RIGHT index finger. S/p cryotherapy x 1.  Neurological: Negative for dizziness, speech difficulty, weakness, light-headedness, numbness and headaches.  Hematological: Negative for adenopathy.  Psychiatric/Behavioral: Negative for confusion and sleep disturbance. The patient is not nervous/anxious.        Patient Active Problem List   Diagnosis Date Noted  . Hyperlipidemia 03/03/2013  . HCV antibody positive 03/03/2013  . CVA (cerebral infarction) 02/04/2013  . Acute ischemic stroke 01/31/2013  . Hypokalemia 01/31/2013  . Right sided weakness 01/30/2013  . HTN (hypertension) 01/30/2013  . DM (diabetes mellitus) 01/30/2013     Prior to Admission medications   Medication Sig Start Date End Date Taking? Authorizing Provider  atorvastatin (LIPITOR) 40 MG tablet Take 1 tablet (40 mg total) by mouth daily at 6 PM. 02/01/14  Yes Artemus Romanoff, PA-C  clopidogrel (PLAVIX) 75 MG tablet Take 1 tablet (75 mg total) by mouth daily. 02/01/14  Yes Viktoriya Glaspy, PA-C  fexofenadine (ALLEGRA) 180 MG tablet Take 180 mg by mouth daily.   Yes Historical Provider, MD  gabapentin (NEURONTIN) 300 MG capsule Take 300 mg by mouth 3 (three) times daily.   Yes Historical Provider, MD  Insulin Glargine (LANTUS SOLOSTAR) 100 UNIT/ML Solostar Pen Inject 15 Units into the skin daily at 10 pm. 04/26/14  Yes Manville Rico, PA-C  Insulin Pen Needle (PEN NEEDLES) 31G X 6 MM MISC Use daily to inject insulin 04/26/14  Yes Destenee Guerry, PA-C  lisinopril (PRINIVIL,ZESTRIL) 10 MG tablet Take 1 tablet (10 mg total)  by mouth daily. Patient not taking: Reported on 09/30/2014 06/14/14   Darlyne Russian, MD     No Known Allergies     Objective:  Physical Exam  Constitutional: She is oriented to person, place, and time. She appears well-developed and well-nourished. No distress.  BP 124/79 mmHg  Pulse 105  Temp(Src)  98.5 F (36.9 C) (Oral)  Resp 16  Ht 5' (1.524 m)  Wt 168 lb 6.4 oz (76.386 kg)  BMI 32.89 kg/m2  SpO2 98%  LMP 09/16/2014   Eyes: Conjunctivae are normal. No scleral icterus.  Neck: No thyromegaly present.  Cardiovascular: Normal rate, regular rhythm, normal heart sounds and intact distal pulses.   Pulmonary/Chest: Effort normal and breath sounds normal.  Musculoskeletal:       Right shoulder: Normal.       Left shoulder: Normal.       Right elbow: Normal.      Left elbow: She exhibits normal range of motion, no swelling, no effusion, no deformity and no laceration. Tenderness found. Lateral epicondyle tenderness noted. No radial head, no medial epicondyle and no olecranon process tenderness noted.       Right wrist: Normal.       Left wrist: Normal.       Cervical back: Normal.       Back:       Right upper arm: Normal.       Left upper arm: Normal.       Right forearm: Normal.       Left forearm: Normal.       Right hand: Normal.       Left hand: Normal.  Lymphadenopathy:    She has no cervical adenopathy.  Neurological: She is alert and oriented to person, place, and time.  Skin: Skin is warm and dry. Lesion (tiny verrucal lesion on the pad of the RIGHT thumb, small verrucal lesion on the pad of the LEFT index finger) noted.  Psychiatric: She has a normal mood and affect. Her behavior is normal.   LEFT Elbow: [facility] reading (PRIMARY) by  Dr. Everlene Farrier. Normal elbow. No bony deformity.      Results for orders placed or performed in visit on 09/30/14  POCT glucose (manual entry)  Result Value Ref Range   POC Glucose 104 (A) 70 - 99 mg/dl  POCT glycosylated hemoglobin (Hb A1C)  Result Value Ref Range   Hemoglobin A1C 6.8     PROCEDURE: Verbal consent obtained.  Cleansed skin with alcohol prep pad.  Cryotherapy applied (freeze-thaw-freeze) to two verrucal lesions described above.  The patient tolerated the procedure well.     Assessment & Plan:   1. Type 2 diabetes  mellitus without complication Well controlled. Continue current regimen. - HM Diabetes Foot Exam - POCT glucose (manual entry) - POCT glycosylated hemoglobin (Hb A1C) - Comprehensive metabolic panel  2. Essential hypertension Controlled. Restart Lisinopril for renal protection. Written in Patient instructions so that her brother can help make sure she gets it.  3. Hyperlipidemia Await lab results. Continue Lipitor. - Lipid panel  4. Left elbow pain Lateral epicondylitis. Meloxicam. Elbow strap OTC. RTC if no better. - DG Elbow Complete Left; Future - meloxicam (MOBIC) 15 MG tablet; Take 1 tablet (15 mg total) by mouth daily.  Dispense: 30 tablet; Refill: 1  5. Screening for HIV (human immunodeficiency virus) - HIV antibody  6. Verruca vulgaris Cryotherapy. Anticipatory guidance. Re-treat if lesions persist in 2-4 weeks.   Fara Chute, PA-C Physician  Assistant-Certified [facility] .

## 2014-09-30 NOTE — Patient Instructions (Addendum)
I will contact you with your lab results as soon as they are available.   If you have not heard from me in 2 weeks, please contact me.  The fastest way to get your results is to register for My Chart (see the instructions on the last page of this printout).  Please make an appointment with an eye specialist for an eye exam.  Please take lisinopril 10 mg, one tablet each day. The prescription is at the pharmacy. Please make sure you get it and take it.  The sugar is GOOD! Keep up the great work and continue the medications as before.  Take the meloxicam for elbow and neck pain. It is for inflammation and pain. Also, purchase an ELBOW STRAP to help with the elbow pain.  Vim Khu?u Tay Do Ch?i Qu?n V?t (Tennis Elbow) Chuyn gia ch?m [place] s?c kh?e ch?n ?on b?n b? m?t ch?ng b?nh th??ng ???c g?i l "Vim Khu?u Tay Do Ch?i Qu?n V?t". Ch?ng b?nh ny do cc v?t rch nh? ho?c ?au (vim) t?i ??u bm (nguyn ?y) c?a cc b c? du?i c?a c?ng tay gy ra. M?c d ch?ng b?nh ny th??ng ???c g?i l vim khu?u tay c?a ng??i ch?i qu?n v?t ho?c ch?i gn, n cn do b?i b?t k? v?n ??ng l?p ?i l?p l?i no ???c th?c hi?n b?ng khu?u tay gy ra.  H??NG D?N CH?M [place] T?I NH  N?u ch?ng b?nh ny thong qua, ngh? ng?i c th? l cch ?i?u tr? duy nh?t ???c c?n ??n. Dng bn tay ho?c cnh tay cn l?i ?? th?c hi?n cng vi?c c th? gip ch. Th?m ch thay ??i cch c?m n?m c?ng c th? gip cho tay ???c ngh? ng?i. ?i?u ny c th? cn phng ng?a ch?ng b?nh ny ti pht.  Tuy nhin, n?u cc tri?u ch?ng t?n t?i lu h?n, th??ng s? ???c lm gi?m ?au nhanh h?n b?ng cch:  U?ng cc thu?c khng vim.  Ch??m ti ? l?nh trong 30 pht vo cu?i ngy lm vi?c, lc ?i ng?, ho?c sau khi v?n ??ng xong.  Chuyn gia ch?m  s?c kh?e c?ng c th? cho s? d?ng n?p hay b?ng ?eo. ?i?u ny s? gip cho gn c? b? vim lnh l?i. ?i khi, tim steroid km v?i gy t c?c b? s? ???c c?n ??n cng v?i n?p c? ??nh trong 1 ??n 2 tu?n. Hai ??n ba m?i tim  steroid th??ng s? gi?i quy?t ???c v?n ??Rowe Robert m?t s? tr??ng h?p dai d?ng, gn c? b? vim khng ?p ?ng v?i vi?c ?i?u tr? b?o t?n (khng ph?u thu?t). Khi ?y ph?u thu?t c th? ???c c?n ??n ?? ch?nh s?a gn c? ?. HY CH?C CH?N R?NG B?N:  Hi?u r nh?ng h??ng d?n ny.  S? theo di tnh tr?ng b?nh c?a b?n.  S? yu c?u tr? gip ngay l?p t?c n?u b?n khng ?? ho?c tnh tr?ng tr?m tr?ng h?n. Document Released: 03/26/2005 Document Revised: 11/26/2012 [facility] Patient Information 2015 [place]. This information is not intended to replace advice given to you by your health care provider. Make sure you discuss any questions you have with your health care provider.

## 2014-10-01 LAB — HIV ANTIBODY (ROUTINE TESTING W REFLEX): HIV 1&2 Ab, 4th Generation: NONREACTIVE

## 2014-10-03 ENCOUNTER — Encounter: Payer: Self-pay | Admitting: Physician Assistant

## 2014-10-03 MED ORDER — ATORVASTATIN CALCIUM 80 MG PO TABS: 80.0000 mg | ORAL_TABLET | Freq: Every day | ORAL | Status: DC

## 2014-10-03 NOTE — Addendum Note (Signed)
Addended by: Fara Chute on: 10/03/2014 01:54 PM   Modules accepted: Orders

## 2014-11-16 ENCOUNTER — Other Ambulatory Visit: Payer: Self-pay | Admitting: Physician Assistant

## 2014-12-30 ENCOUNTER — Encounter: Payer: Self-pay | Admitting: Physician Assistant

## 2014-12-30 ENCOUNTER — Ambulatory Visit (INDEPENDENT_AMBULATORY_CARE_PROVIDER_SITE_OTHER): Payer: BLUE CROSS/BLUE SHIELD | Admitting: Physician Assistant

## 2014-12-30 VITALS — BP 120/84 | HR 99 | Temp 98.4°F | Resp 16 | Ht 60.0 in | Wt 171.8 lb

## 2014-12-30 DIAGNOSIS — B079 Viral wart, unspecified: Secondary | ICD-10-CM

## 2014-12-30 DIAGNOSIS — E119 Type 2 diabetes mellitus without complications: Secondary | ICD-10-CM

## 2014-12-30 DIAGNOSIS — E785 Hyperlipidemia, unspecified: Secondary | ICD-10-CM | POA: Diagnosis not present

## 2014-12-30 DIAGNOSIS — B078 Other viral warts: Secondary | ICD-10-CM

## 2014-12-30 DIAGNOSIS — I10 Essential (primary) hypertension: Secondary | ICD-10-CM

## 2014-12-30 DIAGNOSIS — Z23 Encounter for immunization: Secondary | ICD-10-CM | POA: Diagnosis not present

## 2014-12-30 LAB — COMPREHENSIVE METABOLIC PANEL
ALT: 15 U/L (ref 6–29)
AST: 14 U/L (ref 10–30)
Albumin: 4 g/dL (ref 3.6–5.1)
Alkaline Phosphatase: 38 U/L (ref 33–115)
BUN: 14 mg/dL (ref 7–25)
CO2: 25 mmol/L (ref 20–31)
Calcium: 8.9 mg/dL (ref 8.6–10.2)
Chloride: 107 mmol/L (ref 98–110)
Creat: 0.7 mg/dL (ref 0.50–1.10)
Glucose, Bld: 161 mg/dL — ABNORMAL HIGH (ref 65–99)
Potassium: 4.7 mmol/L (ref 3.5–5.3)
Sodium: 142 mmol/L (ref 135–146)
Total Bilirubin: 0.4 mg/dL (ref 0.2–1.2)
Total Protein: 6.9 g/dL (ref 6.1–8.1)

## 2014-12-30 LAB — LIPID PANEL
Cholesterol: 237 mg/dL — ABNORMAL HIGH (ref 125–200)
HDL: 58 mg/dL (ref >=46)
LDL Cholesterol: 121 mg/dL (ref <130)
Total CHOL/HDL Ratio: 4.1 Ratio (ref <=5.0)
Triglycerides: 292 mg/dL — ABNORMAL HIGH (ref <150)
VLDL: 58 mg/dL — ABNORMAL HIGH (ref <30)

## 2014-12-30 LAB — GLUCOSE, POCT (MANUAL RESULT ENTRY): POC Glucose: 178 mg/dl — AB (ref 70–99)

## 2014-12-30 LAB — POCT GLYCOSYLATED HEMOGLOBIN (HGB A1C): Hemoglobin A1C: 6.7

## 2014-12-30 LAB — HEMOGLOBIN A1C: Hgb A1c MFr Bld: 6.7 % — AB (ref 4.0–6.0)

## 2014-12-30 NOTE — Progress Notes (Signed)
Subjective:     Patient ID: [name], female   DOB: [birth year], 39 y.o.   MRN: [id number] PCP: JEFFERY,[name], PA-C  Chief Complaint  Patient presents with  . Follow-up    3 mos  . Diabetes  . Medication Refill    HPI Patient presents today for evaluation of diabetes, hypertension, and hyperlipidemia.   An interpreter was made available, however her English is adequate and she is able to understand and respond to questions.   She is tolerating her medications well. She measures her BG at home and states that it averages around 120.   She does not monitor her blood pressure at home.   She has not seen an eye doctor recently for an ophthalmologic exam.    Review of Systems  Eyes: Negative for visual disturbance.  Respiratory: Negative for shortness of breath.   Cardiovascular: Negative for chest pain.  Gastrointestinal: Negative for nausea, vomiting, abdominal pain, diarrhea and constipation.  Genitourinary: Negative for dysuria, urgency and frequency.  Musculoskeletal: Positive for arthralgias (Right knee, left knee and left shin, "I walk a lot").  Skin: Positive for rash (Arms and backs are itchy, lotions helps).  Neurological: Positive for headaches ("Not everyday, sometimes").  See HPI   Patient Active Problem List   Diagnosis Date Noted  . Hyperlipidemia 03/03/2013  . HCV antibody positive 03/03/2013  . CVA (cerebral infarction) 02/04/2013  . Acute ischemic stroke 01/31/2013  . Hypokalemia 01/31/2013  . Right sided weakness 01/30/2013  . HTN (hypertension) 01/30/2013  . DM (diabetes mellitus) 01/30/2013    Current Outpatient Prescriptions on File Prior to Visit  Medication Sig Dispense Refill  . atorvastatin (LIPITOR) 80 MG tablet Take 1 tablet (80 mg total) by mouth daily at 6 PM. 90 tablet 3  . B-D UF III MINI PEN NEEDLES 31G X 5 MM MISC U D UTD TO INJECT INSULIN  3  . clopidogrel (PLAVIX) 75 MG tablet Take 1 tablet (75 mg total) by mouth daily. 90 tablet 3  .  gabapentin (NEURONTIN) 100 MG capsule TAKE 3 CAPSULES BY MOUTH THREE TIMES DAILY. 270 capsule 0  . Insulin Glargine (LANTUS SOLOSTAR) 100 UNIT/ML Solostar Pen Inject 15 Units into the skin daily at 10 pm. 15 mL 3  . Insulin Pen Needle (PEN NEEDLES) 31G X 6 MM MISC Use daily to inject insulin 100 each 3  . lisinopril (PRINIVIL,ZESTRIL) 10 MG tablet Take 1 tablet (10 mg total) by mouth daily. 90 tablet 3   No current facility-administered medications on file prior to visit.    No Known Allergies   Objective:  Physical Exam  Constitutional: She is oriented to person, place, and time. She appears well-developed and well-nourished.  HENT:  Head: Normocephalic and atraumatic.  Right Ear: External ear normal.  Left Ear: External ear normal.  Nose: Nose normal.  Mouth/Throat: Oropharynx is clear and moist.  Eyes: Conjunctivae are normal. Pupils are equal, round, and reactive to light.  Neck: Normal range of motion. Neck supple.  Cardiovascular: Normal rate and regular rhythm.   Pulmonary/Chest: Effort normal and breath sounds normal.  Neurological: She is alert and oriented to person, place, and time. She has normal reflexes.  Skin: Skin is warm and dry. Lesion (Small verrucal lesion on the pad of the right thumb. Small verrucal lesion on the pad of the left index finger. ) noted.  Psychiatric: She has a normal mood and affect. Her behavior is normal. Thought content normal.     BP 120/84 mmHg  Pulse 99  Temp(Src) 98.4 F (36.9 C) (Oral)  Resp 16  Ht 5' (1.524 m)  Wt 171 lb 12.8 oz (77.928 kg)  BMI 33.55 kg/m2  SpO2 98%  LMP 12/05/2014   Results for orders placed or performed in visit on 12/30/14  POCT glucose (manual entry)  Result Value Ref Range   POC Glucose 178 (A) 70 - 99 mg/dl  POCT glycosylated hemoglobin (Hb A1C)  Result Value Ref Range   Hemoglobin A1C 6.7     Verbal consent obtained. Cryotherapy applied by freeze-thaw-freeze method to two verrucal lesions  described above. Patient tolerated procedure well.    Assessment & Plan:  1. Type 2 diabetes mellitus without complication Well-controlled. Continue current treatment.  - POCT glucose (manual entry) - POCT glycosylated hemoglobin (Hb A1C) - Comprehensive metabolic panel - Microalbumin, urine  2. Essential hypertension Controlled. Continue current treatment.   3. Hyperlipidemia Await lab results.  - Lipid panel  4. Need for influenza vaccination - Flu Vaccine QUAD 36+ mos IM  5. Verruca vulgaris Cryotherapy performed in office. Follow-up in 2 weeks for re-assessment and cryotherapy if needed.    Elwood Bazinet D. Race, PA-S Physician Assistant Student [facility]

## 2014-12-30 NOTE — Patient Instructions (Signed)
I will contact you with your lab results as soon as they are available.   If you have not heard from me in 2 weeks, please contact me.  The fastest way to get your results is to register for My Chart (see the instructions on the last page of this printout).  Please schedule an appointment with an eye specialist.

## 2014-12-30 NOTE — Progress Notes (Signed)
Patient ID: [name], female    DOB: 1975-12-19, 39 y.o.   MRN: [id number]  PCP: [name]  Subjective:   Chief Complaint  Patient presents with  . Follow-up    3 mos  . Diabetes  . Medication Refill    HPI Presents for evaluation of diabetes, hypertension, and hyperlipidemia.   An interpreter was made available, however her English is adequate and she is able to understand and respond to questions.   She is tolerating her medications well. She measures her BG at home and states that it averages around 120.  She does not monitor her blood pressure at home.  She has not seen an eye doctor recently for an ophthalmologic exam.   Since her last visit, her mother died. She and her family are still grieving. She reports that she is sleeping well and has a good appetite.  The warts on her fingers did not completely resolve after cryotherapy at her last visit. 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   Review of Systems Eyes: Negative for visual disturbance.  Respiratory: Negative for shortness of breath.  Cardiovascular: Negative for chest pain.  Gastrointestinal: Negative for nausea, vomiting, abdominal pain, diarrhea and constipation.  Genitourinary: Negative for dysuria, urgency and frequency.  Musculoskeletal: Positive for arthralgias (Right knee, left knee and left shin, "I walk a lot").  Skin: Positive for rash (Arms and backs are itchy, lotions helps).  Neurological: Positive for headaches ("Not everyday, sometimes").  See HPI      Patient Active Problem List   Diagnosis Date Noted  . Hyperlipidemia 03/03/2013  . HCV antibody positive 03/03/2013  . CVA (cerebral infarction) 02/04/2013  . Acute ischemic stroke 01/31/2013  . Hypokalemia 01/31/2013  . Right sided weakness 01/30/2013  . HTN (hypertension) 01/30/2013  . DM (diabetes mellitus) 01/30/2013     Prior to Admission medications   Medication Sig Start Date End  Date Taking? Authorizing Provider  atorvastatin (LIPITOR) 80 MG tablet Take 1 tablet (80 mg total) by mouth daily at 6 PM. 10/03/14  Yes Lisa Amble, PA-C  B-D UF III MINI PEN NEEDLES 31G X 5 MM MISC U D UTD TO INJECT INSULIN 07/24/14  Yes Historical Provider, MD  clopidogrel (PLAVIX) 75 MG tablet Take 1 tablet (75 mg total) by mouth daily. 02/01/14  Yes Lisa Rowles, PA-C  gabapentin (NEURONTIN) 100 MG capsule TAKE 3 CAPSULES BY MOUTH THREE TIMES DAILY. 11/17/14  Yes Lisa Bale, PA-C  Insulin Glargine (LANTUS SOLOSTAR) 100 UNIT/ML Solostar Pen Inject 15 Units into the skin daily at 10 pm. 04/26/14  Yes Lisa Cacioppo, PA-C  Insulin Pen Needle (PEN NEEDLES) 31G X 6 MM MISC Use daily to inject insulin 04/26/14  Yes Lisa Wivell, PA-C  lisinopril (PRINIVIL,ZESTRIL) 10 MG tablet Take 1 tablet (10 mg total) by mouth daily. 06/14/14  Yes Lisa Russian, MD  meloxicam (MOBIC) 15 MG tablet  09/30/14   Historical Provider, MD     No Known Allergies     Objective:  Physical Exam  Constitutional: She is oriented to person, place, and time. She appears well-developed and well-nourished. No distress.  BP 120/84 mmHg  Pulse 99  Temp(Src) 98.4 F (36.9 C) (Oral)  Resp 16  Ht 5' (1.524 m)  Wt 171 lb 12.8 oz (77.928 kg)  BMI 33.55 kg/m2  SpO2 98%  LMP 12/05/2014   Eyes: Conjunctivae are normal. No scleral icterus.  Neck: No thyromegaly present.  Cardiovascular: Normal rate, regular rhythm, normal heart sounds and intact distal pulses.   Pulmonary/Chest: Effort normal and breath sounds normal.  Lymphadenopathy:    She has no cervical adenopathy.  Neurological: She is alert and oriented to person, place, and time.  Skin: Skin is warm and dry. Lesion (RIGHT thumb and LEFT index finger with verrucal lesions) noted. No rash noted.  Psychiatric: She has a normal mood and affect. Her behavior is normal.    CRYOTHERAPY applied (freeze-thaw-freeze) to the 2 verrucal lesions. She tolerated this  well.   Results for orders placed or performed in visit on 12/30/14  POCT glucose (manual entry)  Result Value Ref Range   POC Glucose 178 (A) 70 - 99 mg/dl  POCT glycosylated hemoglobin (Hb A1C)  Result Value Ref Range   Hemoglobin A1C 6.7        Assessment & Plan:   1. Type 2 diabetes mellitus without complication Controlled. Continue lifestyle changes and current treatment. - POCT glucose (manual entry) - POCT glycosylated hemoglobin (Hb A1C) - Comprehensive metabolic panel - Microalbumin, urine  2. Essential hypertension Controlled. Continue current treatment.  3. Hyperlipidemia Await labs. - Lipid panel  4. Need for influenza vaccination - Flu Vaccine QUAD 36+ mos IM  5. Verruca vulgaris Anticipatory guidance. Repeated cryotherapy today. Re-evaluate in 2 weeks and repeat treatment if any wart persists.   Lisa Chute, PA-C Physician Assistant-Certified [facility]

## 2014-12-31 LAB — MICROALBUMIN, URINE: Microalb, Ur: 0.6 mg/dL (ref <2.0)

## 2015-01-04 ENCOUNTER — Encounter: Payer: Self-pay | Admitting: Physician Assistant

## 2015-01-04 MED ORDER — EZETIMIBE 10 MG PO TABS: 10.0000 mg | ORAL_TABLET | Freq: Every day | ORAL | Status: DC

## 2015-01-04 NOTE — Addendum Note (Signed)
Addended by: Fara Chute on: 01/04/2015 08:30 AM   Modules accepted: Orders

## 2015-01-18 ENCOUNTER — Ambulatory Visit (INDEPENDENT_AMBULATORY_CARE_PROVIDER_SITE_OTHER): Payer: BLUE CROSS/BLUE SHIELD | Admitting: Physician Assistant

## 2015-01-18 ENCOUNTER — Encounter: Payer: Self-pay | Admitting: Physician Assistant

## 2015-01-18 VITALS — BP 133/86 | HR 91 | Temp 98.3°F | Resp 16 | Ht 60.5 in | Wt 174.6 lb

## 2015-01-18 DIAGNOSIS — Z1231 Encounter for screening mammogram for malignant neoplasm of breast: Secondary | ICD-10-CM | POA: Diagnosis not present

## 2015-01-18 DIAGNOSIS — B079 Viral wart, unspecified: Secondary | ICD-10-CM | POA: Diagnosis not present

## 2015-01-18 DIAGNOSIS — B078 Other viral warts: Secondary | ICD-10-CM

## 2015-01-18 NOTE — Patient Instructions (Addendum)
We recommend that you schedule a mammogram for breast cancer screening. Typically, you do not need a referral to do this. Please contact a local imaging center to schedule your mammogram.  [facility] - [phone]  *ask for the Radiology [place] [facility] - [phone] or [phone]  [facility] - [phone] [place] [phone] [facility] - [phone]  *ask for the [facility] - [phone]  *ask for the Radiology Department [facility] - [phone]  *ask for the Mammography Department [place] - [phone]   For eye care: [facility]

## 2015-01-18 NOTE — Progress Notes (Signed)
Subjective:     Patient ID: [name], female   DOB: [birth year], 39 y.o.   MRN: [id number]  PCP: JEFFERY,[name], PA-C  Chief Complaint  Patient presents with  . Follow-up    LEFT INDEX FINGER    HPI Patient presents today for re-evaluation of left index finger and right thumb. She is accompanied by an interpreter, [place] with [employer].  Cryotherapy was done on 09/22 to verruca in these areas. They have been healing well with no complications.   Review of Systems  See HPI  Patient Active Problem List   Diagnosis Date Noted  . Hyperlipidemia 03/03/2013  . HCV antibody positive 03/03/2013  . CVA (cerebral infarction) 02/04/2013  . Hypokalemia 01/31/2013  . Right sided weakness 01/30/2013  . HTN (hypertension) 01/30/2013  . DM (diabetes mellitus) ([place]) 01/30/2013    Prior to Admission medications   Medication Sig Start Date End Date Taking? Authorizing Provider  atorvastatin (LIPITOR) 80 MG tablet Take 1 tablet (80 mg total) by mouth daily at 6 PM. 10/03/14  Yes [name] Jeffery, PA-C  B-D UF III MINI PEN NEEDLES 31G X 5 MM MISC U D UTD TO INJECT INSULIN 07/24/14  Yes Historical Provider, MD  clopidogrel (PLAVIX) 75 MG tablet Take 1 tablet (75 mg total) by mouth daily. 02/01/14  Yes [name] Jeffery, PA-C  ezetimibe (ZETIA) 10 MG tablet Take 1 tablet (10 mg total) by mouth daily. 01/04/15  Yes [name] Jeffery, PA-C  gabapentin (NEURONTIN) 100 MG capsule TAKE 3 CAPSULES BY MOUTH THREE TIMES DAILY. 11/17/14  Yes Mancel Bale, PA-C  Insulin Glargine (LANTUS SOLOSTAR) 100 UNIT/ML Solostar Pen Inject 15 Units into the skin daily at 10 pm. 04/26/14  Yes [name] Jeffery, PA-C  lisinopril (PRINIVIL,ZESTRIL) 10 MG tablet Take 1 tablet (10 mg total) by mouth daily. 06/14/14  Yes Darlyne Russian, MD  Insulin Pen Needle (PEN NEEDLES) 31G X 6 MM MISC Use daily to inject insulin 04/26/14   [name] Jeffery, PA-C  meloxicam (MOBIC) 15 MG tablet  09/30/14   Historical Provider, MD    No Known  Allergies     Objective:  Physical Exam  Constitutional: She is oriented to person, place, and time. She appears well-developed and well-nourished.  HENT:  Head: Normocephalic and atraumatic.  Neurological: She is alert and oriented to person, place, and time.  Skin: Skin is warm and dry. Lesion (Small verrucal lesion on the pad of the right thumb. Small verrucal lesion on the pad of the left index finger. These are notably smaller than previous verrucal lesions at the last visit prior to cryotherapy.) noted.  Psychiatric: She has a normal mood and affect. Her behavior is normal. Thought content normal.    BP 133/86 mmHg  Pulse 91  Temp(Src) 98.3 F (36.8 C) (Oral)  Resp 16  Ht 5' 0.5" (1.537 m)  Wt 174 lb 9.6 oz (79.198 kg)  BMI 33.52 kg/m2  LMP 01/04/2015   Verbal consent obtained. Cryotherapy applied by freeze-thaw-freeze method to two verrucal lesions described above. Patient tolerated procedure well.   Assessment & Plan:  1. Visit for screening mammogram - MM Digital Diagnostic Bilat; Future  2. Verruca vulgaris Cryotherapy performed in office.    Follow-up in 2 weeks for re-assessment of lesions and further cryotherapy if needed.    Antwyne Pingree D. Race, PA-S Physician Assistant Student [facility]

## 2015-01-18 NOTE — Progress Notes (Addendum)
Chief Complaint  Patient presents with  . Follow-up    LEFT INDEX FINGER    History of Present Illness: Patient presents for evaluation of warts on the RIGHT thumb and LEFT index finger, s/p cryotherapy about 2 weeks ago.  She has done well, and has no concerns.  There is remaining wart on the Index finger, but the thumb lesion may be resolved.  Due to language barrier, an interpreter was present during the history-taking and subsequent discussion (and for part of the physical exam) with this patient.   No Known Allergies  Prior to Admission medications   Medication Sig Start Date End Date Taking? Authorizing Provider  atorvastatin (LIPITOR) 80 MG tablet Take 1 tablet (80 mg total) by mouth daily at 6 PM. 10/03/14  Yes Liv Rallis, PA-C  B-D UF III MINI PEN NEEDLES 31G X 5 MM MISC U D UTD TO INJECT INSULIN 07/24/14  Yes Historical Provider, MD  clopidogrel (PLAVIX) 75 MG tablet Take 1 tablet (75 mg total) by mouth daily. 02/01/14  Yes Lillie Bollig, PA-C  ezetimibe (ZETIA) 10 MG tablet Take 1 tablet (10 mg total) by mouth daily. 01/04/15  Yes Tiena Manansala, PA-C  gabapentin (NEURONTIN) 100 MG capsule TAKE 3 CAPSULES BY MOUTH THREE TIMES DAILY. 11/17/14  Yes Mancel Bale, PA-C  Insulin Glargine (LANTUS SOLOSTAR) 100 UNIT/ML Solostar Pen Inject 15 Units into the skin daily at 10 pm. 04/26/14  Yes Reuben Knoblock, PA-C  lisinopril (PRINIVIL,ZESTRIL) 10 MG tablet Take 1 tablet (10 mg total) by mouth daily. 06/14/14  Yes Darlyne Russian, MD  Insulin Pen Needle (PEN NEEDLES) 31G X 6 MM MISC Use daily to inject insulin 04/26/14   Karinne Schmader, PA-C  meloxicam (MOBIC) 15 MG tablet  09/30/14   Historical Provider, MD    Patient Active Problem List   Diagnosis Date Noted  . Hyperlipidemia 03/03/2013  . HCV antibody positive 03/03/2013  . CVA (cerebral infarction) 02/04/2013  . Hypokalemia 01/31/2013  . Right sided weakness 01/30/2013  . HTN (hypertension) 01/30/2013  . DM (diabetes mellitus)  ([place]) 01/30/2013     Physical Exam  Constitutional: She is oriented to person, place, and time. She appears well-developed and well-nourished. She is active and cooperative. No distress.  BP 133/86 mmHg  Pulse 91  Temp(Src) 98.3 F (36.8 C) (Oral)  Resp 16  Ht 5' 0.5" (1.537 m)  Wt 174 lb 9.6 oz (79.198 kg)  BMI 33.52 kg/m2  LMP 01/04/2015   Eyes: Conjunctivae are normal.  Pulmonary/Chest: Effort normal.  Neurological: She is alert and oriented to person, place, and time.  Skin: Skin is warm and dry.  1. RIGHT thumb lesion is nearly resolved, with only a small amount of hyperkeratosis remaining. 2. LEFT index finger lesion persists, but is smaller.  Both areas re-treated with cryotherapy, freeze-that-freeze, which she tolerated well.  Psychiatric: She has a normal mood and affect. Her speech is normal and behavior is normal.      ASSESSMENT & PLAN:  1. Visit for screening mammogram - MM Digital Diagnostic Bilat; Future  2. Verruca vulgaris Local wound care as before. Re-evaluate in 2 weeks. Anticipate need for repeat treatment tot he index finger lesion.   Fara Chute, PA-C Physician Assistant-Certified [facility] screening examination/treatment/procedure(s) were performed by non-physician practitioner and as supervising physician I was immediately available for consultation/collaboration.

## 2015-02-01 ENCOUNTER — Encounter: Payer: Self-pay | Admitting: Physician Assistant

## 2015-02-01 ENCOUNTER — Ambulatory Visit (INDEPENDENT_AMBULATORY_CARE_PROVIDER_SITE_OTHER): Payer: BLUE CROSS/BLUE SHIELD | Admitting: Physician Assistant

## 2015-02-01 VITALS — BP 110/79 | HR 89 | Temp 98.3°F | Resp 16 | Ht 60.0 in | Wt 173.2 lb

## 2015-02-01 DIAGNOSIS — B079 Viral wart, unspecified: Secondary | ICD-10-CM

## 2015-02-01 DIAGNOSIS — B078 Other viral warts: Secondary | ICD-10-CM

## 2015-02-01 NOTE — Progress Notes (Signed)
Subjective:    Patient ID: [name], female    DOB: 06-24-1975, 39 y.o.   MRN: [id number]  Chief Complaint  Patient presents with  . Follow-up    both hands   HPI Patient presents today for re-evaluation of left index finger and right thumb verruca vulgaris. Last cryotherapy was on 01/18/15, has completed 3 prior treatments, all tolerated well by the patient. Right thumb healing very well. Left index finger has some hyperkeratosis.   Patient is doing well otherwise. Patient is accompanied by interpreter. No other concerns on today's visit.   Review of Systems  Constitutional: Negative for fever and chills.  Musculoskeletal: Negative for joint swelling and arthralgias.  Skin:       Warts healing well; no new lesions   Patient Active Problem List   Diagnosis Date Noted  . Hyperlipidemia 03/03/2013  . HCV antibody positive 03/03/2013  . CVA (cerebral infarction) 02/04/2013  . Hypokalemia 01/31/2013  . Right sided weakness 01/30/2013  . HTN (hypertension) 01/30/2013  . DM (diabetes mellitus) ([place]) 01/30/2013   Family History  Problem Relation Age of Onset  . Stroke Mother   . Hypertension Mother   . Hyperlipidemia Mother   . Stroke Father   . Hypertension Father   . Hyperlipidemia Father   . Heart disease Father   . Hyperlipidemia Brother    Social History   Social History  . Marital Status: Single    Spouse Name: n/a  . Number of Children: 2  . Years of Education: 6th grade   Occupational History  . nail salon    Social History Main Topics  . Smoking status: Never Smoker   . Smokeless tobacco: Never Used  . Alcohol Use: No  . Drug Use: No  . Sexual Activity: Not on file   Other Topics Concern  . Not on file   Social History Narrative   From [place], though family is [place], came to the [place] in 1999. Lives with her brother, mother, and two children.   Patient is right-handed.   Patient not drinking any caffeine.   Prior to Admission medications     Medication Sig Start Date End Date Taking? Authorizing Provider  atorvastatin (LIPITOR) 80 MG tablet Take 1 tablet (80 mg total) by mouth daily at 6 PM. 10/03/14  Yes [name] Jeffery, PA-C  clopidogrel (PLAVIX) 75 MG tablet Take 1 tablet (75 mg total) by mouth daily. 02/01/14  Yes [name] Jeffery, PA-C  ezetimibe (ZETIA) 10 MG tablet Take 1 tablet (10 mg total) by mouth daily. 01/04/15  Yes [name] Jeffery, PA-C  gabapentin (NEURONTIN) 100 MG capsule TAKE 3 CAPSULES BY MOUTH THREE TIMES DAILY. 11/17/14  Yes Mancel Bale, PA-C  Insulin Glargine (LANTUS SOLOSTAR) 100 UNIT/ML Solostar Pen Inject 15 Units into the skin daily at 10 pm. 04/26/14  Yes [name] Jeffery, PA-C  lisinopril (PRINIVIL,ZESTRIL) 10 MG tablet Take 1 tablet (10 mg total) by mouth daily. 06/14/14  Yes Darlyne Russian, MD  B-D UF III MINI PEN NEEDLES 31G X 5 MM MISC U D UTD TO INJECT INSULIN 07/24/14   Historical Provider, MD  Insulin Pen Needle (PEN NEEDLES) 31G X 6 MM MISC Use daily to inject insulin 04/26/14   [name] Jeffery, PA-C  meloxicam (MOBIC) 15 MG tablet  09/30/14   Historical Provider, MD   No Known Allergies     Objective:   Physical Exam  Constitutional: She is oriented to person, place, and time. She appears well-developed and well-nourished. No  distress.  BP 110/79 mmHg  Pulse 89  Temp(Src) 98.3 F (36.8 C) (Oral)  Resp 16  Ht 5' (1.524 m)  Wt 173 lb 3.2 oz (78.563 kg)  BMI 33.83 kg/m2  LMP 01/31/2015  HENT:  Head: Normocephalic and atraumatic.  Eyes: EOM are normal. No scleral icterus.  Neurological: She is alert and oriented to person, place, and time.  Skin: Skin is warm and dry. No rash noted. She is not diaphoretic. No erythema.  Right thumb pad verrucal lesion well-healed, small blister. No erythema or edema. Left index finger pad also well-healed with mild hyperkeratosis, no erythema or edema.   Psychiatric: She has a normal mood and affect. Her behavior is normal. Judgment and thought content normal.  A  little nervous for procedure today d/t anticipated discomfort   Procedure: Verbal consent obtained. Cryotherapy applied vai freeze-thaw-freeze method to both verrucal lesions. Patient tolerated procedure well.     Assessment & Plan:  1. Verruca vulgaris - Return to clinic in 2-4 weeks for re-evaluation and possible repeat cryotherapy.

## 2015-02-01 NOTE — Progress Notes (Signed)
Chief Complaint  Patient presents with  . Follow-up    both hands    History of Present Illness: Patient presents for repeat freezing of verruca lesions of the LEFT index finger and RIGHT thumb.  This is the fourth visit for treatment. She has tolerated the treatments well, though she notes that the most recent visit was more painful than the first.   No Known Allergies  Prior to Admission medications   Medication Sig Start Date End Date Taking? Authorizing Provider  atorvastatin (LIPITOR) 80 MG tablet Take 1 tablet (80 mg total) by mouth daily at 6 PM. 10/03/14  Yes Jasun Gasparini, PA-C  clopidogrel (PLAVIX) 75 MG tablet Take 1 tablet (75 mg total) by mouth daily. 02/01/14  Yes Anil Havard, PA-C  ezetimibe (ZETIA) 10 MG tablet Take 1 tablet (10 mg total) by mouth daily. 01/04/15  Yes Thoams Siefert, PA-C  gabapentin (NEURONTIN) 100 MG capsule TAKE 3 CAPSULES BY MOUTH THREE TIMES DAILY. 11/17/14  Yes Mancel Bale, PA-C  Insulin Glargine (LANTUS SOLOSTAR) 100 UNIT/ML Solostar Pen Inject 15 Units into the skin daily at 10 pm. 04/26/14  Yes Shyasia Funches, PA-C  lisinopril (PRINIVIL,ZESTRIL) 10 MG tablet Take 1 tablet (10 mg total) by mouth daily. 06/14/14  Yes Darlyne Russian, MD  B-D UF III MINI PEN NEEDLES 31G X 5 MM MISC U D UTD TO INJECT INSULIN 07/24/14   Historical Provider, MD  Insulin Pen Needle (PEN NEEDLES) 31G X 6 MM MISC Use daily to inject insulin 04/26/14   Anzley Dibbern, PA-C  meloxicam (MOBIC) 15 MG tablet  09/30/14   Historical Provider, MD    Patient Active Problem List   Diagnosis Date Noted  . Hyperlipidemia 03/03/2013  . HCV antibody positive 03/03/2013  . CVA (cerebral infarction) 02/04/2013  . Hypokalemia 01/31/2013  . Right sided weakness 01/30/2013  . HTN (hypertension) 01/30/2013  . DM (diabetes mellitus) ([place]) 01/30/2013     Physical Exam  Constitutional: She is oriented to person, place, and time. She appears well-developed and well-nourished. She is active  and cooperative. No distress.  BP 110/79 mmHg  Pulse 89  Temp(Src) 98.3 F (36.8 C) (Oral)  Resp 16  Ht 5' (1.524 m)  Wt 173 lb 3.2 oz (78.563 kg)  BMI 33.83 kg/m2  LMP 01/31/2015   Eyes: Conjunctivae are normal.  Pulmonary/Chest: Effort normal.  Neurological: She is alert and oriented to person, place, and time.  Skin: Skin is warm, dry and intact. Lesion noted.  Hyperkeratosis exists at both sites. The thumb lesion may be otherwise completely resolved. It was NOT frozen today, and will be reassessed at the next visit. The Index finger was treated, freeze-thaw-freeze.  Psychiatric: She has a normal mood and affect. Her speech is normal and behavior is normal.      ASSESSMENT & PLAN:  1. Verruca vulgaris Local wound care.   Return for re-peat freezing to any remaining warts in 2-4 weeks.    Fara Chute, PA-C Physician Assistant-Certified [facility]

## 2015-02-14 LAB — HM DIABETES EYE EXAM

## 2015-02-21 ENCOUNTER — Other Ambulatory Visit: Payer: Self-pay

## 2015-02-21 ENCOUNTER — Ambulatory Visit
Admission: RE | Admit: 2015-02-21 | Discharge: 2015-02-21 | Disposition: A | Discharge status: Home / Self Care | Payer: BLUE CROSS/BLUE SHIELD | Source: Ambulatory Visit

## 2015-02-21 DIAGNOSIS — Z1231 Encounter for screening mammogram for malignant neoplasm of breast: Secondary | ICD-10-CM

## 2015-02-22 ENCOUNTER — Encounter: Payer: Self-pay | Admitting: Physician Assistant

## 2015-02-22 ENCOUNTER — Ambulatory Visit (INDEPENDENT_AMBULATORY_CARE_PROVIDER_SITE_OTHER): Payer: BLUE CROSS/BLUE SHIELD | Admitting: Physician Assistant

## 2015-02-22 VITALS — BP 126/86 | HR 117 | Temp 98.7°F | Resp 16 | Ht 60.5 in | Wt 169.6 lb

## 2015-02-22 DIAGNOSIS — B079 Viral wart, unspecified: Secondary | ICD-10-CM | POA: Diagnosis not present

## 2015-02-22 DIAGNOSIS — B078 Other viral warts: Secondary | ICD-10-CM

## 2015-02-22 NOTE — Progress Notes (Signed)
Patient ID: [name], female    DOB: Nov 01, 1975, 39 y.o.   MRN: [id number]  PCP: Wynne Dust  Subjective:   Chief Complaint  Patient presents with  . Follow-up  . Wart    left index finger    HPI Patient presents today for follow-up evaluation of verruca lesions of the left index finger and right thumb.   Last seen on 02/01/2015 for her fourth cryotherapy treatment.  Patient is accompanied today by [place] interpretor.    Review of Systems  Constitutional: Negative.   Cardiovascular: Negative.   Endocrine: Negative.   Skin:       Thinks that the thumb lesions is gone, but may still have some lesion on the index finger       Patient Active Problem List   Diagnosis Date Noted  . Hyperlipidemia 03/03/2013  . HCV antibody positive 03/03/2013  . CVA (cerebral infarction) 02/04/2013  . Hypokalemia 01/31/2013  . Right sided weakness 01/30/2013  . HTN (hypertension) 01/30/2013  . DM (diabetes mellitus) ([place]) 01/30/2013     Prior to Admission medications   Medication Sig Start Date End Date Taking? Authorizing Provider  atorvastatin (LIPITOR) 80 MG tablet Take 1 tablet (80 mg total) by mouth daily at 6 PM. 10/03/14  Yes Ocean Kearley, PA-C  B-D UF III MINI PEN NEEDLES 31G X 5 MM MISC U D UTD TO INJECT INSULIN 07/24/14  Yes Historical Provider, MD  clopidogrel (PLAVIX) 75 MG tablet Take 1 tablet (75 mg total) by mouth daily. 02/01/14  Yes Atwood Adcock, PA-C  ezetimibe (ZETIA) 10 MG tablet Take 1 tablet (10 mg total) by mouth daily. 01/04/15  Yes Vasiliki Smaldone, PA-C  gabapentin (NEURONTIN) 100 MG capsule TAKE 3 CAPSULES BY MOUTH THREE TIMES DAILY. 11/17/14  Yes Mancel Bale, PA-C  Insulin Glargine (LANTUS SOLOSTAR) 100 UNIT/ML Solostar Pen Inject 15 Units into the skin daily at 10 pm. 04/26/14  Yes Jayson Waterhouse, PA-C  Insulin Pen Needle (PEN NEEDLES) 31G X 6 MM MISC Use daily to inject insulin 04/26/14  Yes Wayburn Shaler, PA-C  lisinopril (PRINIVIL,ZESTRIL)  10 MG tablet Take 1 tablet (10 mg total) by mouth daily. 06/14/14  Yes Darlyne Russian, MD  meloxicam (MOBIC) 15 MG tablet  09/30/14   Historical Provider, MD     No Known Allergies     Objective:  Physical Exam  Constitutional: She is oriented to person, place, and time. She appears well-developed and well-nourished. She is active and cooperative. No distress.  BP 126/86 mmHg  Pulse 117  Temp(Src) 98.7 F (37.1 C) (Oral)  Resp 16  Ht 5' 0.5" (1.537 m)  Wt 169 lb 9.6 oz (76.93 kg)  BMI 32.56 kg/m2  SpO2 98%  LMP 01/31/2015   Eyes: Conjunctivae are normal.  Cardiovascular: Tachycardia present.   Pulmonary/Chest: Effort normal.  Neurological: She is alert and oriented to person, place, and time.  Skin: Skin is warm and dry.  Hyperkeratotic lesion persists on the LEFT index finger. Cryotherapy performed, which patient tolerated well.  Psychiatric: She has a normal mood and affect. Her speech is normal and behavior is normal.           Assessment & Plan:   1. Verruca vulgaris Local wound care, as before. RTC for routine care in 6 weeks. Do not anticipate need for additional cryotherapy, but will perform at next visit if any lesion persists.   Fara Chute, PA-C Physician Assistant-Certified [facility]

## 2015-02-22 NOTE — Progress Notes (Signed)
Subjective:    Patient ID: [name], female    DOB: 1975/04/23, 39 y.o.   MRN: [id number]  Chief Complaint  Patient presents with  . Follow-up  . Wart    left index finger   HPI Patient presents today for follow-up evaluation of verruca lesions of the left index finger and right thumb.  Last seen on 02/01/2015 for her fourth cryotherapy treatment. Right thumb well-healed, left index finger still has some mild hyperkeratosis. No new lesions.   Patient is accompanied today by [place] interpretor.  No other concerns on today's visit.   Review of Systems  Constitutional: Negative for fever, chills and fatigue.  HENT: Negative for sore throat.   Respiratory: Negative for cough and shortness of breath.   Cardiovascular: Negative for chest pain and palpitations.  Skin: Positive for wound (2 verruca lesions; left index finger and right thumb).  Neurological: Negative for light-headedness, numbness and headaches.   Patient Active Problem List   Diagnosis Date Noted  . Hyperlipidemia 03/03/2013  . HCV antibody positive 03/03/2013  . CVA (cerebral infarction) 02/04/2013  . Hypokalemia 01/31/2013  . Right sided weakness 01/30/2013  . HTN (hypertension) 01/30/2013  . DM (diabetes mellitus) ([place]) 01/30/2013   Family History  Problem Relation Age of Onset  . Stroke Mother   . Hypertension Mother   . Hyperlipidemia Mother   . Stroke Father   . Hypertension Father   . Hyperlipidemia Father   . Heart disease Father   . Hyperlipidemia Brother    Social History   Social History  . Marital Status: Single    Spouse Name: n/a  . Number of Children: 2  . Years of Education: 6th grade   Occupational History  . nail salon    Social History Main Topics  . Smoking status: Never Smoker   . Smokeless tobacco: Never Used  . Alcohol Use: No  . Drug Use: No  . Sexual Activity: Not on file   Other Topics Concern  . Not on file   Social History Narrative   From [place],  though family is [place], came to the [place] in 1999. Lives with her brother, mother, and two children.   Patient is right-handed.   Patient not drinking any caffeine.   Prior to Admission medications   Medication Sig Start Date End Date Taking? Authorizing Provider  atorvastatin (LIPITOR) 80 MG tablet Take 1 tablet (80 mg total) by mouth daily at 6 PM. 10/03/14  Yes [name] Jeffery, PA-C  B-D UF III MINI PEN NEEDLES 31G X 5 MM MISC U D UTD TO INJECT INSULIN 07/24/14  Yes Historical Provider, MD  clopidogrel (PLAVIX) 75 MG tablet Take 1 tablet (75 mg total) by mouth daily. 02/01/14  Yes [name] Jeffery, PA-C  ezetimibe (ZETIA) 10 MG tablet Take 1 tablet (10 mg total) by mouth daily. 01/04/15  Yes [name] Jeffery, PA-C  gabapentin (NEURONTIN) 100 MG capsule TAKE 3 CAPSULES BY MOUTH THREE TIMES DAILY. 11/17/14  Yes Mancel Bale, PA-C  Insulin Glargine (LANTUS SOLOSTAR) 100 UNIT/ML Solostar Pen Inject 15 Units into the skin daily at 10 pm. 04/26/14  Yes [name] Jeffery, PA-C  Insulin Pen Needle (PEN NEEDLES) 31G X 6 MM MISC Use daily to inject insulin 04/26/14  Yes [name] Jeffery, PA-C  lisinopril (PRINIVIL,ZESTRIL) 10 MG tablet Take 1 tablet (10 mg total) by mouth daily. 06/14/14  Yes Darlyne Russian, MD  meloxicam (MOBIC) 15 MG tablet  09/30/14   Historical Provider, MD   No  Known Allergies    Objective:   Physical Exam  Constitutional: She is oriented to person, place, and time. She appears well-developed and well-nourished. No distress.  HENT:  Head: Normocephalic and atraumatic.  Eyes: EOM are normal. No scleral icterus.  Cardiovascular: Normal rate, regular rhythm and normal heart sounds.  Exam reveals no gallop and no friction rub.   No murmur heard. Tachycardic.  Pulmonary/Chest: Effort normal and breath sounds normal. No respiratory distress. She has no wheezes. She has no rales.  Neurological: She is alert and oriented to person, place, and time.  Skin: Skin is warm and dry. She is not diaphoretic.   Left index finger verruca lesion has mild hyperkeratosis centrally. Right thumb well-healed, no treatment needed. Left index finger treated, freeze-thaw-freeze. Patient tolerated well.   Psychiatric: She has a normal mood and affect. Her behavior is normal. Judgment and thought content normal.   BP 126/86 mmHg  Pulse 117  Temp(Src) 98.7 F (37.1 C) (Oral)  Resp 16  Ht 5' 0.5" (1.537 m)  Wt 169 lb 9.6 oz (76.93 kg)  BMI 32.56 kg/m2  SpO2 98%  LMP 01/31/2015     Assessment & Plan:  1. Verruca vulgaris - Local wound care given. Do not anticipate need for further cryotherapy treatment. Follow-up in 6 weeks for DM, HTN, and HLD.

## 2015-03-27 ENCOUNTER — Other Ambulatory Visit: Payer: Self-pay | Admitting: Physician Assistant

## 2015-04-05 ENCOUNTER — Ambulatory Visit (INDEPENDENT_AMBULATORY_CARE_PROVIDER_SITE_OTHER): Payer: BLUE CROSS/BLUE SHIELD | Admitting: Physician Assistant

## 2015-04-05 ENCOUNTER — Encounter: Payer: Self-pay | Admitting: Physician Assistant

## 2015-04-05 VITALS — BP 126/80 | HR 98 | Temp 98.0°F | Resp 16 | Ht 61.0 in | Wt 172.0 lb

## 2015-04-05 DIAGNOSIS — E785 Hyperlipidemia, unspecified: Secondary | ICD-10-CM | POA: Diagnosis not present

## 2015-04-05 DIAGNOSIS — A630 Anogenital (venereal) warts: Secondary | ICD-10-CM | POA: Diagnosis not present

## 2015-04-05 DIAGNOSIS — I10 Essential (primary) hypertension: Secondary | ICD-10-CM | POA: Diagnosis not present

## 2015-04-05 DIAGNOSIS — E119 Type 2 diabetes mellitus without complications: Secondary | ICD-10-CM | POA: Diagnosis not present

## 2015-04-05 LAB — LIPID PANEL
Cholesterol: 179 mg/dL (ref 125–200)
HDL: 54 mg/dL (ref >=46)
LDL Cholesterol: 97 mg/dL (ref <130)
Total CHOL/HDL Ratio: 3.3 Ratio (ref <=5.0)
Triglycerides: 141 mg/dL (ref <150)
VLDL: 28 mg/dL (ref <30)

## 2015-04-05 LAB — COMPREHENSIVE METABOLIC PANEL
ALT: 18 U/L (ref 6–29)
AST: 12 U/L (ref 10–30)
Albumin: 3.5 g/dL — ABNORMAL LOW (ref 3.6–5.1)
Alkaline Phosphatase: 38 U/L (ref 33–115)
BUN: 17 mg/dL (ref 7–25)
CO2: 25 mmol/L (ref 20–31)
Calcium: 9.2 mg/dL (ref 8.6–10.2)
Chloride: 104 mmol/L (ref 98–110)
Creat: 0.91 mg/dL (ref 0.50–1.10)
Glucose, Bld: 170 mg/dL — ABNORMAL HIGH (ref 65–99)
Potassium: 3.9 mmol/L (ref 3.5–5.3)
Sodium: 138 mmol/L (ref 135–146)
Total Bilirubin: 0.4 mg/dL (ref 0.2–1.2)
Total Protein: 6.5 g/dL (ref 6.1–8.1)

## 2015-04-05 LAB — HEMOGLOBIN A1C
Hgb A1c MFr Bld: 6.7 % — ABNORMAL HIGH (ref <5.7)
Mean Plasma Glucose: 146 mg/dL — ABNORMAL HIGH (ref <117)

## 2015-04-05 MED ORDER — IMIQUIMOD 5 % EX CREA: TOPICAL_CREAM | CUTANEOUS | Status: DC

## 2015-04-05 NOTE — Progress Notes (Signed)
Patient ID: [name], female    DOB: 26-Feb-1976, 39 y.o.   MRN: [id number]  PCP: Wynne Dust  Subjective:   Chief Complaint  Patient presents with  . Diabetes    HPI Presents for evaluation of diabetes.  Diabetes and HTN have been controlled for some time with prescription medication and lifestyle changes.  Last A1C 6.7% 12/30/2014. Lipids had increased at last 2 visits.  Reports doing well, without CP, SOB, HA, dizziness, N/V/diarrhea. She does report a 2 month history of a bump between the vagina and the rectum. Sometimes painful when she wipes. No bleeding.     Review of Systems As above.    Patient Active Problem List   Diagnosis Date Noted  . Hyperlipidemia 03/03/2013  . HCV antibody positive 03/03/2013  . CVA (cerebral infarction) 02/04/2013  . Hypokalemia 01/31/2013  . Right sided weakness 01/30/2013  . HTN (hypertension) 01/30/2013  . DM (diabetes mellitus) ([place]) 01/30/2013     Prior to Admission medications   Medication Sig Start Date End Date Taking? Authorizing Provider  atorvastatin (LIPITOR) 80 MG tablet Take 1 tablet (80 mg total) by mouth daily at 6 PM. 10/03/14  Yes Raseel Jans, PA-C  B-D UF III MINI PEN NEEDLES 31G X 5 MM MISC U D UTD TO INJECT INSULIN 07/24/14  Yes Historical Provider, MD  clopidogrel (PLAVIX) 75 MG tablet TAKE 1 TABLET BY MOUTH DAILY 03/28/15  Yes Bailie Christenbury, PA-C  ezetimibe (ZETIA) 10 MG tablet Take 1 tablet (10 mg total) by mouth daily. 01/04/15  Yes Jeanise Durfey, PA-C  gabapentin (NEURONTIN) 100 MG capsule TAKE 3 CAPSULES BY MOUTH THREE TIMES DAILY 03/28/15  Yes Mancel Bale, PA-C  Insulin Glargine (LANTUS SOLOSTAR) 100 UNIT/ML Solostar Pen Inject 15 Units into the skin daily at 10 pm. Patient taking differently: Inject 20 Units into the skin daily at 10 pm.  04/26/14  Yes Kasen Sako, PA-C  Insulin Pen Needle (PEN NEEDLES) 31G X 6 MM MISC Use daily to inject insulin 04/26/14  Yes Jakorian Marengo, PA-C    lisinopril (PRINIVIL,ZESTRIL) 10 MG tablet Take 1 tablet (10 mg total) by mouth daily. 06/14/14  Yes Darlyne Russian, MD     No Known Allergies     Objective:  Physical Exam  Constitutional: She is oriented to person, place, and time. She appears well-developed and well-nourished. No distress.  BP 126/80 mmHg  Pulse 98  Temp(Src) 98 F (36.7 C)  Resp 16  Ht 5' 1"  (1.549 m)  Wt 172 lb (78.019 kg)  BMI 32.52 kg/m2   Eyes: Conjunctivae are normal. No scleral icterus.  Neck: No thyromegaly present.  Cardiovascular: Normal rate, regular rhythm, normal heart sounds and intact distal pulses.   Pulmonary/Chest: Effort normal and breath sounds normal.  Genitourinary:    Pelvic exam was performed with patient supine. No labial fusion. There is no rash, tenderness or injury on the right labia. There is no rash, tenderness or injury on the left labia.  Lymphadenopathy:    She has no cervical adenopathy.  Neurological: She is alert and oriented to person, place, and time.  Skin: Skin is warm and dry.  Psychiatric: She has a normal mood and affect. Her behavior is normal.           Assessment & Plan:   1. Type 2 diabetes mellitus without complication, without long-term current use of insulin ([place]) Await lab results. - Comprehensive metabolic panel - Hemoglobin A1c  2. Essential hypertension Controlled. - Comprehensive  metabolic panel - Lipid panel  3. Hyperlipidemia Await lab results. - Lipid panel  4. Condyloma acuminata Initiate treatment with imiquimod. Apply at HS, wash off in the AM 3x weekly.  - imiquimod (ALDARA) 5 % cream; Apply topically 3 (three) times a week. Apply at HS. Wash off in the morning.  Dispense: 12 each; Refill: 2  Return in about 3 months (around 07/04/2015) for re-evaluation of warts and diabetes.   Fara Chute, PA-C Physician Assistant-Certified [facility]

## 2015-04-05 NOTE — Patient Instructions (Signed)
I will contact you with your lab results as soon as they are available.   If you have not heard from me in 2 weeks, please contact me.  The fastest way to get your results is to register for My Chart (see the instructions on the last page of this printout).  Si mo g (Genital Warts) Si mo g l m?t STD (b?nh ly truy?n qua ???ng tnh d?c) ph? bi?n. Si mo g c th? xu?t hi?n d??i d?ng nh?ng m?n nh? trn cc m c?a vng sinh d?c ho?c vng h?u mn. ?i khi chng c th? b? kch ?ng v gy ?au. Si mo g th??ng d? ly sang ng??i khc thng qua quan h? tnh d?c. ?i?u tr? c vai tr quan tr?ng v si mo g c th? d?n ??n cc v?n ?? khc. ? ph? n?, vi rt gy si mo g c th? lm t?ng nguy c? ung th? c? t? cung. NGUYN NHN Si mo g do m?t vi rt c tn l vi rt papilloma ? ng??i (HPV) gy ra. HPV ly lan khi quan h? tnh d?c khng c bi?n php b?o v? v?i ng??i b? nhi?m b?nh. B?nh c th? ly lan thng qua quan h? tnh d?c b?ng ???ng m ??o, h?u mn v mi?ng. Nhi?u ng??i khng bi?t r?ng h? ? b? nhi?m b?nh. H? c th? b? nhi?m trong nhi?u n?m m khng c v?n ?? g. Tuy nhin, ngay c? khi h? khng c v?n ?? g, h? v?n c th? truy?n b?nh cho cc b?n tnh. CC Y?U T? NGUY C? Si mo g d? x?y ra h?n ?:  Nh?ng ng??i quan h? tnh d?c khng c bi?n php b?o v?.  Nh?ng ng??i c nhi?u b?n tnh.  Nh?ng ng??i c quan h? tnh d?c tr??c 16 tu?i.  Nam gi?i khng c?t bao quy ??u.  Ph? n? c m?t b?n tnh nam gi?i ch?a c?t bao quy ??u.  Nh?ng ng??i c h? th?ng b?o v? c? th? (h? mi?n d?ch) y?u do b?nh t?t ho?c thu?c.  Nh?ng ng??i ht thu?c. TRI?U CH?NG Tri?u ch?ng c?a si mo g bao g?m:  Nh?ng c?c nh? m?c ? vng sinh d?c ho?c vng h?u mn. Nh?ng c?c si ny th??ng m?c thnh t?ng ?m.  Ng?a v kch ?ng ? vng sinh d?c ho?c vng tr?c trng.  Ch?y mu ? cc c?c si.  ?au khi giao h?p. CH?N ?ON Si mo g th??ng c th? ???c ch?n ?on qua b? ngoi c?a c?c si ? m ??o, m h?, d??ng v?t, vng ?y  ch?u, h?u mn, ho?c tr?c trng. Cc ki?m tra c?ng c th? ???c th?c hi?n, ch?ng h?n nh?:  Sinh thi?t. M?t m?u m ???c l?y ?? c th? soi d??i knh hi?n vi.  Soi m ??o. ? ph? n?, m?t knh phng ??i ???c dng ?? khm m ??o v c? t? cung. M?t s? dung d?ch nh?t ??nh c th? ???c s? d?ng ?? lm thay ??i mu s?c c?a cc t? bo HPV ?? c th? d? dng nhn th?y cc t? bo ny h?n.  Xt nghi?m Pap ? n? gi?i.  Xt nghi?m ?? tm cc STD khc. ?I?U TR? ?i?u tr? si mo g c th? bao g?m:  Bi thu?c ???c k ??n vo c?c si. Nh?ng thu?c ny c th? d??i d?ng dung d?ch ho?c kem bi.  Lm l?nh c?c si b?ng nitrogen l?ng (li?u php lm l?nh).  ??t c?c si b?ng:  ?i?u tr? b?ng tia laser.  ??u  d ?i?n (??t ?i?n).  Tim m?t ch?t (Khng nguyn [place] ho?c Khng nguyn [place]) vo c?c si ?? gip h? mi?n d?ch c?a c? th? ch?ng l?i c?c si.  Tim Interferon.  Ph?u thu?t ?? c?t b? c?c si. H??NG D?N CH?M [place] T?I NH Thu?c  Ch? bi thu?c khng c?n k ??n v thu?c c?n k ??n theo ch? d?n c?a chuyn gia ch?m [place] s?c kh?e.  Khng ?i?u tr? si mo g b?ng cc lo?i thu?c dng ?? ?i?u tr? m?n cc ? tay.  Ni chuy?n v?i chuyn gia ch?m [place] s?c kh?e v? vi?c s? d?ng cc lo?i kem ch?ng ng?a khng c?n k ??n. H??ng d?n chung  Khng s? ho?c gi vo c?c si.  Khng quan h? tnh d?c cho ??n khi vi?c ?i?u tr? ? hon t?t.  Ni cho cc b?n tnh hi?n t?i v b?n tnh tr??c ?y v? tnh tr?ng c?a qu v? v h? c?ng c th? c?n ???c ?i?u tr?Athena Masse th? t?t c? cc cu?c h?n khm l?i theo ch? d?n c?a chuyn gia ch?m [place] s?c kh?e. ?i?u ny c vai tr quan tr?ng.  Sau khi ?i?u tr?, s? d?ng bao cao su khi quan h? tnh d?c ?? trnh ly nhi?m trong t??ng lai. Nh?ng h??ng d?n khc dnh cho n? gi?i  N? gi?i b? si mo g c th? c?n t?ng c??ng sng l?c v? ung th? c? t? cung. Lo?i ung th? ny pht tri?n ch?m v c th? ch?a ???c n?u pht hi?n s?m. Nguy c? b? ung th? c? t? cung t?ng ln khi c HPV.  N?u qu v? mang thai, hy  ni cho chuyn gia ch?m [place] s?c kh?e l qu v? ? b? nhi?m HPV. Chuyn gia ch?m [place] s?c kh?e s? theo di ch?t ch? trong th?i k? mang thai c?a qu v? ?? b?o ??m con qu v? an ton. PHNG NG?A Ni v?i chuyn gia ch?m [place] s?c kh?e v? vi?c tim v?c xin HPV. Nh?ng v?c xin ny trnh m?t s? b?nh nhi?m trng HPV v ung th?Marland Kitchen V?c xin ???c khuy?n ngh? tim cho nam gi?i v n? gi?i t? 9-26 tu?i. V?c xin s? khng c tc d?ng n?u qu v? ? c HPV v khng khuy?n ngh? tim cho ph? n? mang [place]. ?I KHM N?U:  Qu v? b? ??, s?ng ho?c ?au ? vng da ???c ?i?u tr?Sander Nephew v? b? s?t.  Qu v? c?m th?y m?t m?i ton thn.  Qu v? c?m th?y u c?c ? t?i ho?c xung quanh vng sinh d?c ho?c vng h?u mn.  Qu v? b? ch?y mu ? vng sinh d?c ho?c vng h?u mn.  Qu v? b? ?au khi giao h?p.   Thng tin ny khng nh?m m?c ?ch thay th? cho l?i khuyn m chuyn gia ch?m [place] s?c kh?e ni v?i qu v?. Hy b?o ??m qu v? ph?i th?o lu?n b?t k? v?n ?? g m qu v? c v?i chuyn gia ch?m [place] s?c kh?e c?a qu v?.   Document Released: 03/26/2005 Document Revised: 12/15/2014 Elsevier Interactive Patient Education 2016 Elsevier Inc.  Genital Warts Genital warts are a common STD (sexually transmitted disease). They may appear as small bumps on the tissues of the genital area or anal area. Sometimes, they can become irritated and cause pain. Genital warts are easily passed to other people through sexual contact. Getting treatment is important because genital warts can lead to other problems. In females, the virus that causes genital warts may increase the  risk of cervical cancer. CAUSES Genital warts are caused by a virus that is called human papillomavirus (HPV). HPV is spread by having unprotected sex with an infected person. It can be spread through vaginal, anal, and oral sex. Many people do not know that they are infected. They may be infected for years without problems. However, even if they do not have problems, they can pass the  infection to their sexual partners. RISK FACTORS Genital warts are more likely to develop in:  People who have unprotected sex.  People who have multiple sexual partners.  People who become sexually active before they are 41 years of age.  Men who are not circumcised.  Women who have a female sexual partner who is not circumcised.  People who have a weakened body defense system (immune system) due to disease or medicine.  People who smoke. SYMPTOMS Symptoms of genital warts include:  Small growths in the genital area or anal area. These warts often grow in clusters.  Itching and irritation in the genital area or anal area.  Bleeding from the warts.  Painful sexual intercourse. DIAGNOSIS Genital warts can usually be diagnosed from their appearance on the vagina, vulva, penis, perineum, anus, or rectum. Tests may also be done, such as:  Biopsy. A tissue sample is removed so it can be looked at under a microscope.  Colposcopy. In females, a magnifying tool is used to examine the vagina and cervix. Certain solutions may be used to make the HPV cells change color so they can be seen more easily.  A Pap test in females.  Tests for other STDs. TREATMENT Treatment for genital warts may include:  Applying prescription medicines to the warts. These may be solutions or creams.  Freezing the warts with liquid nitrogen (cryotherapy).  Burning the warts with:  Laser treatment.  An electrified probe (electrocautery).  Injecting a substance (Candida antigen or Trichophyton antigen) into the warts to help the body's immune system to fight off the warts.  Interferon injections.  Surgery to remove the warts. HOME CARE INSTRUCTIONS Medicines  Apply over-the-counter and prescription medicines only as told by your health care provider.  Do not treat genital warts with medicines that are used for treating hand warts.  Talk with your health care provider about using  over-the-counter anti-itch creams. General Instructions  Do not touch or scratch the warts.  Do not have sex until your treatment has been completed.  Tell your current and past sexual partners about your condition because they may also need treatment.  Keep all follow-up visits as told by your health care provider. This is important.  After treatment, use condoms during sex to prevent future infections. Other Instructions for Women  Women who have genital warts might need increased screening for cervical cancer. This type of cancer is slow growing and can be cured if it is found early. Chances of developing cervical cancer are increased with HPV.  If you become pregnant, tell your health care provider that you have had HPV. Your health care provider will monitor you closely during pregnancy to be sure that your baby is safe. PREVENTION Talk with your health care provider about getting the HPV vaccines. These vaccines prevent some HPV infections and cancers. It is recommended that the vaccine be given to males and females who are 18-32 years of age. It will not work if you already have HPV, and it is not recommended for pregnant women. SEEK MEDICAL CARE IF:  You have redness, swelling, or  pain in the area of the treated skin.  You have a fever.  You feel generally ill.  You feel lumps in and around your genital area or anal area.  You have bleeding in your genital area or anal area.  You have pain during sexual intercourse.   This information is not intended to replace advice given to you by your health care provider. Make sure you discuss any questions you have with your health care provider.   Document Released: 03/23/2000 Document Revised: 12/15/2014 Document Reviewed: 06/21/2014 Elsevier Interactive Patient Education [employer].

## 2015-04-08 ENCOUNTER — Telehealth: Payer: Self-pay

## 2015-04-08 NOTE — Telephone Encounter (Signed)
PA completed on covermymeds for imiquimod cream (Aldara). Pending.

## 2015-04-11 ENCOUNTER — Telehealth: Payer: Self-pay

## 2015-04-11 NOTE — Telephone Encounter (Signed)
imiquimod (ALDARA) 5 % cream [[id number]

## 2015-04-11 NOTE — Telephone Encounter (Signed)
PA approved through 08/05/15. Notified pharm.

## 2015-04-11 NOTE — Telephone Encounter (Signed)
Please contact her pharmacy or the prescription benefit manager and inquire what alternative for condyloma they DO cover.

## 2015-04-11 NOTE — Telephone Encounter (Signed)
Pt states the cream she was prescribed by [name] isn't covered on her insurance. Need to have something else called in that will cover. Please call [place]

## 2015-04-15 NOTE — Telephone Encounter (Signed)
See notes under 12/30 message. I had completed a PA on Aldara cream and it was approved, and pharm notified.

## 2015-04-19 ENCOUNTER — Encounter: Payer: Self-pay | Admitting: Physician Assistant

## 2015-05-13 ENCOUNTER — Other Ambulatory Visit: Payer: Self-pay | Admitting: Physician Assistant

## 2015-06-21 ENCOUNTER — Other Ambulatory Visit: Payer: Self-pay | Admitting: Physician Assistant

## 2015-07-12 ENCOUNTER — Ambulatory Visit (INDEPENDENT_AMBULATORY_CARE_PROVIDER_SITE_OTHER): Payer: BLUE CROSS/BLUE SHIELD | Admitting: Physician Assistant

## 2015-07-12 ENCOUNTER — Encounter: Payer: Self-pay | Admitting: Physician Assistant

## 2015-07-12 VITALS — BP 132/90 | HR 82 | Temp 98.8°F | Resp 16 | Ht 60.5 in | Wt 176.8 lb

## 2015-07-12 DIAGNOSIS — E785 Hyperlipidemia, unspecified: Secondary | ICD-10-CM | POA: Diagnosis not present

## 2015-07-12 DIAGNOSIS — E119 Type 2 diabetes mellitus without complications: Secondary | ICD-10-CM

## 2015-07-12 DIAGNOSIS — I10 Essential (primary) hypertension: Secondary | ICD-10-CM | POA: Diagnosis not present

## 2015-07-12 DIAGNOSIS — Z794 Long term (current) use of insulin: Secondary | ICD-10-CM

## 2015-07-12 LAB — COMPREHENSIVE METABOLIC PANEL
ALT: 17 U/L (ref 6–29)
AST: 13 U/L (ref 10–30)
Albumin: 3.7 g/dL (ref 3.6–5.1)
Alkaline Phosphatase: 37 U/L (ref 33–115)
BUN: 10 mg/dL (ref 7–25)
CO2: 25 mmol/L (ref 20–31)
Calcium: 9.4 mg/dL (ref 8.6–10.2)
Chloride: 106 mmol/L (ref 98–110)
Creat: 0.78 mg/dL (ref 0.50–1.10)
Glucose, Bld: 101 mg/dL — ABNORMAL HIGH (ref 65–99)
Potassium: 4.3 mmol/L (ref 3.5–5.3)
Sodium: 141 mmol/L (ref 135–146)
Total Bilirubin: 0.4 mg/dL (ref 0.2–1.2)
Total Protein: 6.7 g/dL (ref 6.1–8.1)

## 2015-07-12 LAB — LIPID PANEL
Cholesterol: 181 mg/dL (ref 125–200)
HDL: 50 mg/dL (ref >=46)
LDL Cholesterol: 87 mg/dL (ref <130)
Total CHOL/HDL Ratio: 3.6 Ratio (ref <=5.0)
Triglycerides: 220 mg/dL — ABNORMAL HIGH (ref <150)
VLDL: 44 mg/dL — ABNORMAL HIGH (ref <30)

## 2015-07-12 LAB — HEMOGLOBIN A1C
Hgb A1c MFr Bld: 7.1 % — ABNORMAL HIGH (ref <5.7)
Mean Plasma Glucose: 157 mg/dL

## 2015-07-12 LAB — MICROALBUMIN, URINE: Microalb, Ur: 5.4 mg/dL

## 2015-07-12 MED ORDER — CLOPIDOGREL BISULFATE 75 MG PO TABS: 75.0000 mg | ORAL_TABLET | Freq: Every day | ORAL | Status: DC

## 2015-07-12 MED ORDER — GABAPENTIN 100 MG PO CAPS: ORAL_CAPSULE | ORAL | Status: DC

## 2015-07-12 MED ORDER — LISINOPRIL 10 MG PO TABS: 10.0000 mg | ORAL_TABLET | Freq: Every day | ORAL | Status: DC

## 2015-07-12 NOTE — Patient Instructions (Signed)
     IF you received an x-ray today, you will receive an invoice from [facility]. Please contact  Radiology at [phone] with questions or concerns regarding your invoice.   IF you received labwork today, you will receive an invoice from [facility]. Please contact [facility] at [phone] with questions or concerns regarding your invoice.   Our billing staff will not be able to assist you with questions regarding bills from these companies.  You will be contacted with the lab results as soon as they are available. The fastest way to get your results is to activate your My Chart account. Instructions are located on the last page of this paperwork. If you have not heard from us regarding the results in 2 weeks, please contact this office.      

## 2015-07-12 NOTE — Progress Notes (Signed)
Patient ID: [name], female    DOB: 07/30/1975, 40 y.o.   MRN: [id number]  PCP: Wynne Dust  Subjective:   Chief Complaint  Patient presents with  . Follow-up  . Diabetes  . Medication Refill    HPI Presents for evaluation of DM type 2, HTN, and verrucal lesions on the fingers s/p repeated cryotherapy.  She reports that she is doing well overall. The warts have resolved completely. No CP, SOB, HA, dizziness, nausea, vomiting, urinary symptoms or rash.  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 The pain sometimes extends up the back of the ehad and down into the upper back. When that occurs, she takes Advil with relief.    Review of Systems As above.    Patient Active Problem List   Diagnosis Date Noted  . Hyperlipidemia 03/03/2013  . HCV antibody positive 03/03/2013  . CVA (cerebral infarction) 02/04/2013  . Hypokalemia 01/31/2013  . Right sided weakness 01/30/2013  . HTN (hypertension) 01/30/2013  . DM (diabetes mellitus) ([place]) 01/30/2013     Prior to Admission medications   Medication Sig Start Date End Date Taking? Authorizing Provider  clopidogrel (PLAVIX) 75 MG tablet TAKE 1 TABLET BY MOUTH DAILY 06/21/15  Yes Bobby Ragan, PA-C  ezetimibe (ZETIA) 10 MG tablet Take 1 tablet (10 mg total) by mouth daily. 01/04/15  Yes Chalon Zobrist, PA-C  gabapentin (NEURONTIN) 100 MG capsule TAKE 3 CAPSULES BY MOUTH THREE TIMES DAILY 03/28/15  Yes Mancel Bale, PA-C  LANTUS SOLOSTAR 100 UNIT/ML Solostar Pen INJECT 15 UNITS INTO THE SKIN DAILY AT 10:00 PM 05/17/15  Yes Heith Haigler, PA-C  lisinopril (PRINIVIL,ZESTRIL) 10 MG tablet Take 1 tablet (10 mg total) by mouth daily. 06/14/14  Yes Darlyne Russian, MD  atorvastatin (LIPITOR) 80 MG tablet Take 1 tablet (80 mg total) by mouth daily at 6 PM. Patient not taking: Reported on 07/12/2015 10/03/14   Harrison Mons, PA-C  B-D  UF III MINI PEN NEEDLES 31G X 5 MM MISC Reported on 07/12/2015 07/24/14   Historical Provider, MD  B-D UF III MINI PEN NEEDLES 31G X 5 MM MISC USE DAILY AS DIRECTED TO INJECT INSULIN Patient not taking: Reported on 07/12/2015 05/17/15   Theadora Noyes, PA-C  imiquimod (ALDARA) 5 % cream Apply topically 3 (three) times a week. Apply at HS. Wash off in the morning. Patient not taking: Reported on 07/12/2015 04/05/15   Harrison Mons, PA-C     No Known Allergies     Objective:  Physical Exam  Constitutional: She is oriented to person, place, and time. She appears well-developed and well-nourished. No distress.  BP 132/90 mmHg  Pulse 82  Temp(Src) 98.8 F (37.1 C) (Oral)  Resp 16  Ht 5' 0.5" (1.537 m)  Wt 176 lb 12.8 oz (80.196 kg)  BMI 33.95 kg/m2  SpO2 99%  LMP 07/12/2015   Eyes: Conjunctivae are normal. No scleral icterus.  Neck: Normal range of motion. Neck supple. No thyromegaly present.  Cardiovascular: Normal rate, regular rhythm, normal heart sounds and intact distal pulses.   Pulmonary/Chest: Effort normal and breath sounds normal.  Musculoskeletal:       Cervical back: She exhibits tenderness and pain. She exhibits normal range of motion, no bony tenderness, no swelling, no edema, no deformity, no laceration, no spasm and normal pulse.  Back:  Lymphadenopathy:    She has no cervical adenopathy.  Neurological: She is alert and oriented to person, place, and time.  Skin: Skin is warm and dry. No lesion (no verrucal lesion recurrence) and no rash noted. No cyanosis. Nails show no clubbing.  Psychiatric: She has a normal mood and affect. Her speech is normal and behavior is normal.           Assessment & Plan:   1. Essential hypertension Controlled. Continue current regimen. - Comprehensive metabolic panel - lisinopril (PRINIVIL,ZESTRIL) 10 MG tablet; Take 1 tablet (10 mg total) by mouth daily.  Dispense: 90 tablet; Refill: 3  2. Type 2 diabetes mellitus without  complication, with long-term current use of insulin ([place]) Await labs. Has been well controlled. Continue current regimen. - HM Diabetes Foot Exam - Comprehensive metabolic panel - Hemoglobin A1c - Microalbumin, urine  3. Hyperlipidemia Await lab results. Anticipate restart statin therapy to reduce cardiovascular and recurrent stroke risk. - Comprehensive metabolic panel - Lipid panel    Return in about 3 months (around 10/11/2015) for diabetes follow-up.    Fara Chute, PA-C Physician Assistant-Certified [facility]

## 2015-07-16 ENCOUNTER — Encounter: Payer: Self-pay | Admitting: Physician Assistant

## 2015-07-25 ENCOUNTER — Other Ambulatory Visit: Payer: Self-pay | Admitting: Physician Assistant

## 2015-08-29 ENCOUNTER — Other Ambulatory Visit: Payer: Self-pay | Admitting: Physician Assistant

## 2015-10-05 ENCOUNTER — Other Ambulatory Visit: Payer: Self-pay | Admitting: Physician Assistant

## 2015-10-18 ENCOUNTER — Encounter: Payer: Self-pay | Admitting: Physician Assistant

## 2015-10-18 ENCOUNTER — Ambulatory Visit (INDEPENDENT_AMBULATORY_CARE_PROVIDER_SITE_OTHER): Payer: BLUE CROSS/BLUE SHIELD | Admitting: Physician Assistant

## 2015-10-18 VITALS — BP 116/82 | HR 106 | Temp 97.7°F | Resp 16 | Ht 60.25 in | Wt 173.0 lb

## 2015-10-18 DIAGNOSIS — G8110 Spastic hemiplegia affecting unspecified side: Secondary | ICD-10-CM | POA: Diagnosis not present

## 2015-10-18 DIAGNOSIS — E785 Hyperlipidemia, unspecified: Secondary | ICD-10-CM

## 2015-10-18 DIAGNOSIS — E049 Nontoxic goiter, unspecified: Secondary | ICD-10-CM

## 2015-10-18 DIAGNOSIS — I10 Essential (primary) hypertension: Secondary | ICD-10-CM | POA: Diagnosis not present

## 2015-10-18 DIAGNOSIS — R208 Other disturbances of skin sensation: Secondary | ICD-10-CM

## 2015-10-18 DIAGNOSIS — R200 Anesthesia of skin: Secondary | ICD-10-CM

## 2015-10-18 DIAGNOSIS — E119 Type 2 diabetes mellitus without complications: Secondary | ICD-10-CM | POA: Diagnosis not present

## 2015-10-18 DIAGNOSIS — Z6833 Body mass index (BMI) 33.0-33.9, adult: Secondary | ICD-10-CM | POA: Insufficient documentation

## 2015-10-18 DIAGNOSIS — R0789 Other chest pain: Secondary | ICD-10-CM | POA: Diagnosis not present

## 2015-10-18 DIAGNOSIS — Z794 Long term (current) use of insulin: Secondary | ICD-10-CM

## 2015-10-18 DIAGNOSIS — E010 Iodine-deficiency related diffuse (endemic) goiter: Secondary | ICD-10-CM

## 2015-10-18 LAB — CBC WITH DIFFERENTIAL/PLATELET
Basophils Absolute: 0 cells/uL (ref 0–200)
Basophils Relative: 0 %
Eosinophils Absolute: 0 cells/uL — ABNORMAL LOW (ref 15–500)
Eosinophils Relative: 0 %
HCT: 40 % (ref 35.0–45.0)
Hemoglobin: 12.5 g/dL (ref 11.7–15.5)
Lymphocytes Relative: 9 %
Lymphs Abs: 1017 cells/uL (ref 850–3900)
MCH: 22.9 pg — ABNORMAL LOW (ref 27.0–33.0)
MCHC: 31.3 g/dL — ABNORMAL LOW (ref 32.0–36.0)
MCV: 73.4 fL — ABNORMAL LOW (ref 80.0–100.0)
MPV: 9.7 fL (ref 7.5–12.5)
Monocytes Absolute: 339 cells/uL (ref 200–950)
Monocytes Relative: 3 %
Neutro Abs: 9944 cells/uL — ABNORMAL HIGH (ref 1500–7800)
Neutrophils Relative %: 88 %
Platelets: 465 10*3/uL — ABNORMAL HIGH (ref 140–400)
RBC: 5.45 MIL/uL — ABNORMAL HIGH (ref 3.80–5.10)
RDW: 16.2 % — ABNORMAL HIGH (ref 11.0–15.0)
WBC: 11.3 10*3/uL — ABNORMAL HIGH (ref 3.8–10.8)

## 2015-10-18 LAB — LIPID PANEL
Cholesterol: 232 mg/dL — ABNORMAL HIGH (ref 125–200)
HDL: 77 mg/dL (ref >=46)
LDL Cholesterol: 139 mg/dL — ABNORMAL HIGH (ref <130)
Total CHOL/HDL Ratio: 3 Ratio (ref <=5.0)
Triglycerides: 80 mg/dL (ref <150)
VLDL: 16 mg/dL (ref <30)

## 2015-10-18 LAB — COMPREHENSIVE METABOLIC PANEL
ALT: 24 U/L (ref 6–29)
AST: 14 U/L (ref 10–30)
Albumin: 4.4 g/dL (ref 3.6–5.1)
Alkaline Phosphatase: 46 U/L (ref 33–115)
BUN: 17 mg/dL (ref 7–25)
CO2: 21 mmol/L (ref 20–31)
Calcium: 10 mg/dL (ref 8.6–10.2)
Chloride: 102 mmol/L (ref 98–110)
Creat: 0.68 mg/dL (ref 0.50–1.10)
Glucose, Bld: 200 mg/dL — ABNORMAL HIGH (ref 65–99)
Potassium: 4.2 mmol/L (ref 3.5–5.3)
Sodium: 136 mmol/L (ref 135–146)
Total Bilirubin: 0.9 mg/dL (ref 0.2–1.2)
Total Protein: 7.6 g/dL (ref 6.1–8.1)

## 2015-10-18 LAB — TSH: TSH: 1.09 mIU/L

## 2015-10-18 MED ORDER — ATORVASTATIN CALCIUM 80 MG PO TABS: 80.0000 mg | ORAL_TABLET | Freq: Every day | ORAL | Status: DC

## 2015-10-18 MED ORDER — MELOXICAM 15 MG PO TABS: 15.0000 mg | ORAL_TABLET | Freq: Every day | ORAL | Status: DC

## 2015-10-18 MED ORDER — GABAPENTIN 100 MG PO CAPS: ORAL_CAPSULE | ORAL | Status: DC

## 2015-10-18 MED ORDER — INSULIN GLARGINE 100 UNIT/ML SOLOSTAR PEN: PEN_INJECTOR | SUBCUTANEOUS | Status: DC

## 2015-10-18 MED ORDER — OMEGA-3-ACID ETHYL ESTERS 1 G PO CAPS: 1.0000 g | ORAL_CAPSULE | Freq: Two times a day (BID) | ORAL | Status: DC

## 2015-10-18 NOTE — Patient Instructions (Addendum)
STOP the Ibuprofen (Advil). START the meloxicam. You CAN use acetaminophen (Tylenol).  Wear the wrist splint when you are not at work. If it helps, we can get you one for the LEFT wrist as well.  Keep up the great work with healthier eating!       IF you received an x-ray today, you will receive an invoice from [facility]. Please contact [facility] at [phone] with questions or concerns regarding your invoice.   IF you received labwork today, you will receive an invoice from [employer]. Please contact [facility] at [phone] with questions or concerns regarding your invoice.   Our billing staff will not be able to assist you with questions regarding bills from these companies.  You will be contacted with the lab results as soon as they are available. The fastest way to get your results is to activate your My Chart account. Instructions are located on the last page of this paperwork. If you have not heard from [place] regarding the results in 2 weeks, please contact this office.

## 2015-10-18 NOTE — Progress Notes (Signed)
Patient ID: [name], female    DOB: March 13, 1976, 40 y.o.   MRN: [id number]  PCP: Harrison Mons, PA-C  Subjective:   Chief Complaint  Patient presents with  . Follow-up  . Diabetes  . pt would like to discuss med change for Zetia    Zetia per [employer]  . Hypertension    HPI Presents for evaluation of diabetes and HTN. There is a Therapist, occupational present.  She reports that she feels well in general. She is making healthier eating choices, like switching from white to brown rice.  She notes that there is a pain in the RIGHT side of her chest. It is not worse with movement, eating. Increases with palpation. Improves with belching and Ibuprofen. Comes and goes.   Numbness and tingling in both hands, worse on the RIGHT.  She received a letter from her insurance plan that the Zetia is moving to a more expensive tier, and she asks for an alternative.  When reviewing her medications, she does not have atorvastatin in her bag, and doesn't recognize the name as something she's taking. It's unclear when she stopped it.    Review of Systems As above. No other CP, no SOB, HA, dizziness, nausea, vomiting, diarrhea. No vision changes. No urinary symptoms.    Patient Active Problem List   Diagnosis Date Noted  . BMI 33.0-33.9,adult 10/18/2015  . Hyperlipidemia 03/03/2013  . HCV antibody positive 03/03/2013  . CVA (cerebral infarction) 02/04/2013  . Hypokalemia 01/31/2013  . Right sided weakness 01/30/2013  . HTN (hypertension) 01/30/2013  . DM (diabetes mellitus) ([place]) 01/30/2013     Prior to Admission medications   Medication Sig Start Date End Date Taking? Authorizing Provider  B-D UF III MINI PEN NEEDLES 31G X 5 MM MISC USE DAILY AS DIRECTED TO INJECT INSULIN 08/29/15  Yes Kham Zuckerman, PA-C  clopidogrel (PLAVIX) 75 MG tablet Take 1 tablet (75 mg total) by mouth daily. 07/12/15  Yes Freedom Peddy, PA-C  gabapentin (NEURONTIN) 100 MG capsule TAKE 3 CAPSULES  BY MOUTH THREE TIMES DAILY 07/12/15  Yes Ether Goebel, PA-C  LANTUS SOLOSTAR 100 UNIT/ML Solostar Pen ADMINISTER 15 UNITS UNDER THE SKIN DAILY AT 10:00 PM 10/06/15  Yes Mancel Bale, PA-C  lisinopril (PRINIVIL,ZESTRIL) 10 MG tablet Take 1 tablet (10 mg total) by mouth daily. 07/12/15  Yes Logann Whitebread, PA-C  atorvastatin (LIPITOR) 80 MG tablet Take 1 tablet (80 mg total) by mouth daily at 6 PM. Patient not taking: Reported on 07/12/2015 10/03/14   Harrison Mons, PA-C     No Known Allergies     Objective:  Physical Exam  Constitutional: She is oriented to person, place, and time. She appears well-developed and well-nourished. She is active and cooperative. No distress.  BP 116/82 mmHg  Pulse 106  Temp(Src) 97.7 F (36.5 C) (Oral)  Resp 16  Ht 5' 0.25" (1.53 m)  Wt 173 lb (78.472 kg)  BMI 33.52 kg/m2  SpO2 98%  LMP 10/06/2015  HENT:  Head: Normocephalic and atraumatic.  Right Ear: Hearing normal.  Left Ear: Hearing normal.  Eyes: Conjunctivae are normal. No scleral icterus.  Neck: Normal range of motion. Neck supple. No thyromegaly present.  Cardiovascular: Normal rate, regular rhythm and normal heart sounds.   Pulses:      Radial pulses are 2+ on the right side, and 2+ on the left side.  Pulmonary/Chest: Effort normal and breath sounds normal. She exhibits tenderness.    Lymphadenopathy:  Head (right side): No tonsillar, no preauricular, no posterior auricular and no occipital adenopathy present.       Head (left side): No tonsillar, no preauricular, no posterior auricular and no occipital adenopathy present.    She has no cervical adenopathy.       Right: No supraclavicular adenopathy present.       Left: No supraclavicular adenopathy present.  Neurological: She is alert and oriented to person, place, and time. She has normal strength. No sensory deficit.  Phalen's and Tinel's performed, but did not reproduce symptoms.  Skin: Skin is warm, dry and intact. No rash noted.  No cyanosis or erythema. Nails show no clubbing.  Psychiatric: She has a normal mood and affect. Her speech is normal and behavior is normal.           Assessment & Plan:   1. Type 2 diabetes mellitus without complication, with long-term current use of insulin ([place]) Await lab results. Adjust regimen as indicated. - Hemoglobin A1c - Comprehensive metabolic panel - Insulin Glargine (LANTUS SOLOSTAR) 100 UNIT/ML Solostar Pen; ADMINISTER 15 UNITS UNDER THE SKIN DAILY AT 10:00 PM  Dispense: 15 mL; Refill: 3 - atorvastatin (LIPITOR) 80 MG tablet; Take 1 tablet (80 mg total) by mouth daily at 6 PM.  Dispense: 90 tablet; Refill: 3  2. Essential hypertension Controlled. - CBC with Differential/Platelet - Comprehensive metabolic panel  3. BMI 33.0-33.9,adult Encouraged continued efforts for healthier eating and increased exercise.  4. Hyperlipidemia Await lab results. - Lipid panel - Comprehensive metabolic panel - omega-3 acid ethyl esters (LOVAZA) 1 g capsule; Take 1 capsule (1 g total) by mouth 2 (two) times daily.  Dispense: 180 capsule; Refill: 3 - atorvastatin (LIPITOR) 80 MG tablet; Take 1 tablet (80 mg total) by mouth daily at 6 PM.  Dispense: 90 tablet; Refill: 3  5. Bilateral hand numbness Trial of meloxicam and RIGHT wrist splint. If beneficial, would provide a splint for the LEFT. 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 - meloxicam (MOBIC) 15 MG tablet; Take 1 tablet (15 mg total) by mouth daily.  Dispense: 30 tablet; Refill: 1 - Care order/instruction  6. Chest wall pain Musculoskeletal. Trial of meloxicam.  - meloxicam (MOBIC) 15 MG tablet; Take 1 tablet (15 mg total) by mouth daily.  Dispense: 30 tablet; Refill: 1  7. Thyromegaly Mild. TSH. Consider thyroid US. - TSH  8. Spastic hemiplegia affecting dominant side ([place]) Not clear that this continues to be an issue. May be able to reduce dose and monitor. Continue for now. -  gabapentin (NEURONTIN) 100 MG capsule; TAKE 3 CAPSULES BY MOUTH THREE TIMES DAILY  Dispense: 270 capsule; Refill: 3   Fara Chute, PA-C Physician Assistant-Certified [facility]

## 2015-10-18 NOTE — Progress Notes (Signed)
Subjective:    Patient ID: [name], female    DOB: 1975-08-11, 40 y.o.   MRN: [id number]  HPI  [name] is here for diabetes f/u appointment. She is taking her metformin as prescribed. Checks her blood glucose daily, range is usually 90s-115. She has switched to brown rice from white rice. She has not noticed any changes in her skin, unhealing wounds, increased thirst, polyuria. She occasionally has mild abdominal pain, but denies any nausea, vomiting, or diarrhea. She does not have any numbness in her feet, does not check them daily. She does experience some numbness in her hands that is worse in the right hand.   She has been having some chest pain that she describes as stabbing, intermittent, does not radiate. It is relieved with advil, putting pressure on the area, and belching. She does not notice anything that brings the pain about. She experiences no associated nausea, diaphoresis, or SOB with the pain. She denies any leg swelling or pain. She does bruise easily and her bruises remain for an extended period of time. Patient is taking plavix.    Review of Systems  Constitutional: Negative for fatigue and unexpected weight change.  Respiratory: Negative for shortness of breath.   Cardiovascular: Positive for chest pain. Negative for palpitations and leg swelling.       Right chest wall tenderness  Gastrointestinal: Negative for nausea, vomiting, abdominal pain, diarrhea and constipation.  Endocrine: Negative for polydipsia and polyuria.  Genitourinary: Negative for difficulty urinating.  Skin: Negative for color change and wound.  Neurological: Positive for numbness. Negative for dizziness and headaches.       BL hands, worse in right hand       Objective:   Physical Exam  Constitutional: She is oriented to person, place, and time. She appears well-developed and well-nourished. She is cooperative. No distress.  HENT:  Head: Normocephalic and atraumatic.  Eyes:  Fundoscopic  exam:      The right eye shows no AV nicking, no exudate and no papilledema.       The left eye shows no AV nicking, no exudate and no papilledema.  Neck: Trachea normal. Neck supple. Thyromegaly present.  Cardiovascular: Normal rate, regular rhythm, normal heart sounds and intact distal pulses.   Pulmonary/Chest: Effort normal and breath sounds normal. She exhibits tenderness. She exhibits no mass.    Abdominal: Soft. Bowel sounds are normal. There is no tenderness.  Musculoskeletal: Normal range of motion.  Lymphadenopathy:    She has no cervical adenopathy.  Neurological: She is alert and oriented to person, place, and time. She has normal strength. No sensory deficit.  Skin: Skin is warm, dry and intact.  Psychiatric: She has a normal mood and affect. Her speech is normal and behavior is normal. Judgment and thought content normal. Cognition and memory are normal.          Assessment & Plan:  1. Type 2 diabetes - continue with insulin glargine 100 unit/mL solostar pen - Hgb A1c, CBC w/ diff, CMET, lipid panel ordered. Will follow up with patient on these results. - encouraged continued healthy eating  2. Essential HTN - continue with lisinopril 10 mg PO daily  3. Hyperlipidemia - lipid panel ordered - patient was not taking atorvastatin 80 mg PO daily. Discussed with patient need for this medication and reordered for patient.  - changed zetia 10 mg PO daily to Lovaza 1g PO BID, because of increased cost of Zetia.  4. BL hand numbness  and spastic hemiplegia affecting dominant side - started on gabapentin s/p CVA. Continue with gabapentin 100 mg PO TID.  5. Chest wall pain - reproducible pain on palpation, likely musculoskeletal not cardiac - started on meloxicam 15 mg PO daily  6. Thyromegaly - TSH ordered will f/u with patient on results.  7. BMI 33.0-33.9 - encouraged continued healthy eating and exercise  Kelly Rayburn PA-S 10/18/2015

## 2015-10-19 LAB — HEMOGLOBIN A1C
Hgb A1c MFr Bld: 8.3 % — ABNORMAL HIGH (ref <5.7)
Mean Plasma Glucose: 192 mg/dL

## 2015-10-22 ENCOUNTER — Encounter: Payer: Self-pay | Admitting: Physician Assistant

## 2015-12-08 ENCOUNTER — Other Ambulatory Visit: Payer: Self-pay | Admitting: Physician Assistant

## 2016-01-24 ENCOUNTER — Encounter: Payer: Self-pay | Admitting: Physician Assistant

## 2016-01-24 ENCOUNTER — Ambulatory Visit (INDEPENDENT_AMBULATORY_CARE_PROVIDER_SITE_OTHER): Payer: BLUE CROSS/BLUE SHIELD | Admitting: Physician Assistant

## 2016-01-24 VITALS — BP 120/80 | HR 103 | Temp 98.3°F | Resp 16 | Ht 60.25 in | Wt 171.8 lb

## 2016-01-24 DIAGNOSIS — Z23 Encounter for immunization: Secondary | ICD-10-CM

## 2016-01-24 DIAGNOSIS — E785 Hyperlipidemia, unspecified: Secondary | ICD-10-CM

## 2016-01-24 DIAGNOSIS — Z794 Long term (current) use of insulin: Secondary | ICD-10-CM

## 2016-01-24 DIAGNOSIS — E119 Type 2 diabetes mellitus without complications: Secondary | ICD-10-CM

## 2016-01-24 DIAGNOSIS — I10 Essential (primary) hypertension: Secondary | ICD-10-CM | POA: Diagnosis not present

## 2016-01-24 LAB — LIPID PANEL
Cholesterol: 168 mg/dL (ref 125–200)
HDL: 54 mg/dL (ref >=46)
LDL Cholesterol: 72 mg/dL (ref <130)
Total CHOL/HDL Ratio: 3.1 Ratio (ref <=5.0)
Triglycerides: 212 mg/dL — ABNORMAL HIGH (ref <150)
VLDL: 42 mg/dL — ABNORMAL HIGH (ref <30)

## 2016-01-24 LAB — CBC WITH DIFFERENTIAL/PLATELET
Basophils Absolute: 0 cells/uL (ref 0–200)
Basophils Relative: 0 %
Eosinophils Absolute: 136 cells/uL (ref 15–500)
Eosinophils Relative: 2 %
HCT: 40.7 % (ref 35.0–45.0)
Hemoglobin: 12.3 g/dL (ref 11.7–15.5)
Lymphocytes Relative: 19 %
Lymphs Abs: 1292 cells/uL (ref 850–3900)
MCH: 22.7 pg — ABNORMAL LOW (ref 27.0–33.0)
MCHC: 30.2 g/dL — ABNORMAL LOW (ref 32.0–36.0)
MCV: 75.1 fL — ABNORMAL LOW (ref 80.0–100.0)
MPV: 10.2 fL (ref 7.5–12.5)
Monocytes Absolute: 952 cells/uL — ABNORMAL HIGH (ref 200–950)
Monocytes Relative: 14 %
Neutro Abs: 4420 cells/uL (ref 1500–7800)
Neutrophils Relative %: 65 %
Platelets: 395 10*3/uL (ref 140–400)
RBC: 5.42 MIL/uL — ABNORMAL HIGH (ref 3.80–5.10)
RDW: 16.1 % — ABNORMAL HIGH (ref 11.0–15.0)
WBC: 6.8 10*3/uL (ref 3.8–10.8)

## 2016-01-24 LAB — COMPREHENSIVE METABOLIC PANEL
ALT: 17 U/L (ref 6–29)
AST: 12 U/L (ref 10–30)
Albumin: 3.8 g/dL (ref 3.6–5.1)
Alkaline Phosphatase: 52 U/L (ref 33–115)
BUN: 14 mg/dL (ref 7–25)
CO2: 26 mmol/L (ref 20–31)
Calcium: 9.3 mg/dL (ref 8.6–10.2)
Chloride: 102 mmol/L (ref 98–110)
Creat: 0.89 mg/dL (ref 0.50–1.10)
Glucose, Bld: 227 mg/dL — ABNORMAL HIGH (ref 65–99)
Potassium: 4.6 mmol/L (ref 3.5–5.3)
Sodium: 138 mmol/L (ref 135–146)
Total Bilirubin: 0.6 mg/dL (ref 0.2–1.2)
Total Protein: 6.8 g/dL (ref 6.1–8.1)

## 2016-01-24 LAB — HEMOGLOBIN A1C
Hgb A1c MFr Bld: 9.4 % — ABNORMAL HIGH (ref <5.7)
Mean Plasma Glucose: 223 mg/dL

## 2016-01-24 LAB — TSH: TSH: 4 mIU/L

## 2016-01-24 NOTE — Patient Instructions (Addendum)
You are due for an eye exam in November. Please schedule an appointment with your eye specialist.  Try taking the meloxicam (the pill I gave you last time for the chest pain and wrist pain) for the elbow pain and swelling. If that doesn't help, you can use an elbow strap.  Please continue exercising, and try to increase it to 12 minutes each day.    IF you received an x-ray today, you will receive an invoice from [facility]. Please contact [facility] at [phone] with questions or concerns regarding your invoice.   IF you received labwork today, you will receive an invoice from [employer]. Please contact [facility] at [phone] with questions or concerns regarding your invoice.   Our billing staff will not be able to assist you with questions regarding bills from these companies.  You will be contacted with the lab results as soon as they are available. The fastest way to get your results is to activate your My Chart account. Instructions are located on the last page of this paperwork. If you have not heard from [place] regarding the results in 2 weeks, please contact this office.    Influenza (Flu) Vaccine (Inactivated or Recombinant):  1. Why get vaccinated? Influenza ("flu") is a contagious disease that spreads around the [place] every year, usually between October and May. Flu is caused by influenza viruses, and is spread mainly by coughing, sneezing, and close contact. Anyone can get flu. Flu strikes suddenly and can last several days. Symptoms vary by age, but can include:  fever/chills  sore throat  muscle aches  fatigue  cough  headache  runny or stuffy nose Flu can also lead to pneumonia and blood infections, and cause diarrhea and seizures in children. If you have a medical condition, such as heart or lung disease, flu can make it worse. Flu is more dangerous for some people. Infants and young children, people 59 years  of age and older, pregnant women, and people with certain health conditions or a weakened immune system are at greatest risk. Each year thousands of people in the [place] die from flu, and many more are hospitalized. Flu vaccine can:  keep you from getting flu,  make flu less severe if you do get it, and  keep you from spreading flu to your family and other people. 2. Inactivated and recombinant flu vaccines A dose of flu vaccine is recommended every flu season. Children 6 months through 76 years of age may need two doses during the same flu season. Everyone else needs only one dose each flu season. Some inactivated flu vaccines contain a very small amount of a mercury-based preservative called thimerosal. Studies have not shown thimerosal in vaccines to be harmful, but flu vaccines that do not contain thimerosal are available. There is no live flu virus in flu shots. They cannot cause the flu. There are many flu viruses, and they are always changing. Each year a new flu vaccine is made to protect against three or four viruses that are likely to cause disease in the upcoming flu season. But even when the vaccine doesn't exactly match these viruses, it may still provide some protection. Flu vaccine cannot prevent:  flu that is caused by a virus not covered by the vaccine, or  illnesses that look like flu but are not. It takes about 2 weeks for protection to develop after vaccination, and protection lasts through the flu season. 3. Some people should not get this  vaccine Tell the person who is giving you the vaccine:  If you have any severe, life-threatening allergies. If you ever had a life-threatening allergic reaction after a dose of flu vaccine, or have a severe allergy to any part of this vaccine, you may be advised not to get vaccinated. Most, but not all, types of flu vaccine contain a small amount of egg protein.  If you ever had Guillain-Barre Syndrome (also called GBS). Some  people with a history of GBS should not get this vaccine. This should be discussed with your doctor.  If you are not feeling well. It is usually okay to get flu vaccine when you have a mild illness, but you might be asked to come back when you feel better. 4. Risks of a vaccine reaction With any medicine, including vaccines, there is a chance of reactions. These are usually mild and go away on their own, but serious reactions are also possible. Most people who get a flu shot do not have any problems with it. Minor problems following a flu shot include:  soreness, redness, or swelling where the shot was given  hoarseness  sore, red or itchy eyes  cough  fever  aches  headache  itching  fatigue If these problems occur, they usually begin soon after the shot and last 1 or 2 days. More serious problems following a flu shot can include the following:  There may be a small increased risk of Guillain-Barre Syndrome (GBS) after inactivated flu vaccine. This risk has been estimated at 1 or 2 additional cases per million people vaccinated. This is much lower than the risk of severe complications from flu, which can be prevented by flu vaccine.  Young children who get the flu shot along with pneumococcal vaccine (PCV13) and/or DTaP vaccine at the same time might be slightly more likely to have a seizure caused by fever. Ask your doctor for more information. Tell your doctor if a child who is getting flu vaccine has ever had a seizure. Problems that could happen after any injected vaccine:  People sometimes faint after a medical procedure, including vaccination. Sitting or lying down for about 15 minutes can help prevent fainting, and injuries caused by a fall. Tell your doctor if you feel dizzy, or have vision changes or ringing in the ears.  Some people get severe pain in the shoulder and have difficulty moving the arm where a shot was given. This happens very rarely.  Any medication can  cause a severe allergic reaction. Such reactions from a vaccine are very rare, estimated at about 1 in a million doses, and would happen within a few minutes to a few hours after the vaccination. As with any medicine, there is a very remote chance of a vaccine causing a serious injury or death. The safety of vaccines is always being monitored. For more information, visit: http://www.aguilar.org/ 5. What if there is a serious reaction? What should I look for?  Look for anything that concerns you, such as signs of a severe allergic reaction, very high fever, or unusual behavior. Signs of a severe allergic reaction can include hives, swelling of the face and throat, difficulty breathing, a fast heartbeat, dizziness, and weakness. These would start a few minutes to a few hours after the vaccination. What should I do?  If you think it is a severe allergic reaction or other emergency that can't wait, call 9-1-1 and get the person to the nearest hospital. Otherwise, call your doctor.  Reactions should  be reported to the Vaccine Adverse Event Reporting System (VAERS). Your doctor should file this report, or you can do it yourself through the VAERS web site at www.vaers.SamedayNews.es, or by calling 530-291-2897. VAERS does not give medical advice. 6. The National Vaccine Injury Compensation Program The [employer] Vaccine Injury Compensation Program (VICP) is a federal program that was created to compensate people who may have been injured by certain vaccines. Persons who believe they may have been injured by a vaccine can learn about the program and about filing a claim by calling (906)838-6706 or visiting the [employer] website at GoldCloset.com.ee. There is a time limit to file a claim for compensation. 7. How can I learn more?  Ask your healthcare provider. He or she can give you the vaccine package insert or suggest other sources of information.  Call your local or state health  department.  Contact the Centers for Disease Control and Prevention (CDC):  Call 8190704675 (1-800-CDC-INFO) or  Visit CDC's website at https://gibson.com/ Vaccine Information Statement Inactivated Influenza Vaccine (11/13/2013)   This information is not intended to replace advice given to you by your health care provider. Make sure you discuss any questions you have with your health care provider.   Document Released: 01/18/2006 Document Revised: 04/16/2014 Document Reviewed: 11/16/2013 Elsevier Interactive Patient Education [employer].

## 2016-01-24 NOTE — Progress Notes (Signed)
Patient ID: [name], female    DOB: 06-Oct-1975, 40 y.o.   MRN: [id number]  PCP: Harrison Mons, PA-C  Subjective:   Chief Complaint  Patient presents with  . Diabetes    HPI Presents for evaluation of diabetes.  She has been working on [employer] and exercise. She's walking about 10 minutes each morning, limited by weakness in the RIGHT leg, residual from CVA.  At her last visit, she was experiencing LEFT wrist pain and chest wall pain, both of which have resolved.  She now reports several days of pain of the RIGHT elbow. No trauma. She works as a Engineer, civil (consulting). She has not tried the meloxicam prescribed last visit for this problem.   Continues regular medications, tolerating them without difficulty.  Review of Systems  Constitutional: Negative for activity change, appetite change, fatigue and unexpected weight change.  HENT: Negative for congestion, dental problem, ear pain, hearing loss, mouth sores, postnasal drip, rhinorrhea, sneezing, sore throat, tinnitus and trouble swallowing.   Eyes: Negative for photophobia, pain, redness and visual disturbance.  Respiratory: Negative for cough, chest tightness and shortness of breath.   Cardiovascular: Negative for chest pain, palpitations and leg swelling.  Gastrointestinal: Negative for abdominal pain, blood in stool, constipation, diarrhea, nausea and vomiting.  Genitourinary: Negative for dysuria, frequency, hematuria and urgency.  Musculoskeletal: Positive for arthralgias (RIGHT elbow). Negative for gait problem, myalgias and neck stiffness.       RIGHT hand dominant  Skin: Negative for rash.  Neurological: Negative for dizziness, speech difficulty, weakness, light-headedness, numbness and headaches.  Hematological: Negative for adenopathy.  Psychiatric/Behavioral: Negative for confusion and sleep disturbance. The patient is not nervous/anxious.        Patient Active Problem List   Diagnosis Date Noted  . BMI  33.0-33.9,adult 10/18/2015  . Hyperlipidemia 03/03/2013  . HCV antibody positive 03/03/2013  . CVA (cerebral infarction) 02/04/2013  . Hypokalemia 01/31/2013  . Right sided weakness 01/30/2013  . HTN (hypertension) 01/30/2013  . DM (diabetes mellitus) ([place]) 01/30/2013     Prior to Admission medications   Medication Sig Start Date End Date Taking? Authorizing Provider  atorvastatin (LIPITOR) 80 MG tablet Take 1 tablet (80 mg total) by mouth daily at 6 PM. 10/18/15  Yes Kristen Fromm, PA-C  clopidogrel (PLAVIX) 75 MG tablet Take 1 tablet (75 mg total) by mouth daily. 07/12/15  Yes Renelle Stegenga, PA-C  gabapentin (NEURONTIN) 100 MG capsule TAKE 3 CAPSULES BY MOUTH THREE TIMES DAILY 10/18/15  Yes Regan Mcbryar, PA-C  Insulin Glargine (LANTUS SOLOSTAR) 100 UNIT/ML Solostar Pen ADMINISTER 15 UNITS UNDER THE SKIN DAILY AT 10:00 PM 10/18/15  Yes Taelyn Broecker, PA-C  lisinopril (PRINIVIL,ZESTRIL) 10 MG tablet Take 1 tablet (10 mg total) by mouth daily. 07/12/15  Yes Sefora Tietje, PA-C  meloxicam (MOBIC) 15 MG tablet Take 1 tablet (15 mg total) by mouth daily. 10/18/15  Yes Lynden Flemmer, PA-C  omega-3 acid ethyl esters (LOVAZA) 1 g capsule Take 1 capsule (1 g total) by mouth 2 (two) times daily. 10/18/15  Yes Raelle Chambers, PA-C  B-D UF III MINI PEN NEEDLES 31G X 5 MM MISC USE DAILY AS DIRECTED TO INJECT INSULIN 12/09/15   Jashley Yellin, PA-C     No Known Allergies     Objective:  Physical Exam  Constitutional: She is oriented to person, place, and time. She appears well-developed and well-nourished. She is active and cooperative. No distress.  BP 120/80 (BP Location: Left Arm, Cuff Size: Normal)   Pulse Marland Kitchen)  103   Temp 98.3 F (36.8 C) (Oral)   Resp 16   Ht 5' 0.25" (1.53 m)   Wt 171 lb 12.8 oz (77.9 kg)   LMP 01/05/2016   SpO2 98%   BMI 33.27 kg/m   HENT:  Head: Normocephalic and atraumatic.  Right Ear: Hearing normal.  Left Ear: Hearing normal.  Eyes: Conjunctivae are normal. No  scleral icterus.  Neck: Normal range of motion. Neck supple. No thyromegaly present.  Cardiovascular: Normal rate, regular rhythm and normal heart sounds.   Pulses:      Radial pulses are 2+ on the right side, and 2+ on the left side.  Pulmonary/Chest: Effort normal and breath sounds normal.  Musculoskeletal:       Right elbow: She exhibits swelling (lateral epicondyle). She exhibits normal range of motion, no effusion, no deformity and no laceration. Tenderness found. Lateral epicondyle tenderness noted. No radial head, no medial epicondyle and no olecranon process tenderness noted.       Right wrist: Normal.       Right upper arm: Normal.       Right forearm: Normal.       Arms:      Right hand: Normal.  Lymphadenopathy:       Head (right side): No tonsillar, no preauricular, no posterior auricular and no occipital adenopathy present.       Head (left side): No tonsillar, no preauricular, no posterior auricular and no occipital adenopathy present.    She has no cervical adenopathy.       Right: No supraclavicular adenopathy present.       Left: No supraclavicular adenopathy present.  Neurological: She is alert and oriented to person, place, and time. No sensory deficit.  Skin: Skin is warm, dry and intact. No rash noted. No cyanosis or erythema. Nails show no clubbing.  Psychiatric: She has a normal mood and affect. Her speech is normal and behavior is normal.           Assessment & Plan:   1. Type 2 diabetes mellitus without complication, with long-term current use of insulin ([place]) Await labs. Adjust regimen as indicated by results. May need increased insulin regimen. - Comprehensive metabolic panel - Hemoglobin A1c - TSH  2. Essential hypertension Controlled. Continue current treatment, - CBC with Differential/Platelet - Comprehensive metabolic panel  3. Hyperlipidemia, unspecified hyperlipidemia type Await labs. Adjust regimen as indicated by results. - Comprehensive  metabolic panel - Lipid panel  4. Needs flu shot - Flu Vaccine QUAD 36+ mos IM - Care order/instruction:   Return in about 3 months (around 04/25/2016) for re-evaluation.   Fara Chute, PA-C Physician Assistant-Certified [facility]

## 2016-01-25 ENCOUNTER — Encounter: Payer: Self-pay | Admitting: Physician Assistant

## 2016-01-25 DIAGNOSIS — R718 Other abnormality of red blood cells: Secondary | ICD-10-CM | POA: Insufficient documentation

## 2016-01-31 ENCOUNTER — Other Ambulatory Visit: Payer: Self-pay | Admitting: Physician Assistant

## 2016-01-31 DIAGNOSIS — Z1231 Encounter for screening mammogram for malignant neoplasm of breast: Secondary | ICD-10-CM

## 2016-02-03 ENCOUNTER — Encounter: Payer: Self-pay | Admitting: *Deleted

## 2016-02-10 ENCOUNTER — Other Ambulatory Visit: Payer: Self-pay

## 2016-02-10 MED ORDER — METFORMIN HCL 500 MG PO TABS: 500.0000 mg | ORAL_TABLET | Freq: Every day | ORAL | 3 refills | Status: DC

## 2016-02-10 NOTE — Telephone Encounter (Signed)
Pt's brother left message on VM about getting lab results for pt. I advised him of [name]'s note and advised him that I was going to send in the medication she recommended. Pt's brother understood and will advise pt. I also advised to increase lantus to 25 units daily.

## 2016-02-22 ENCOUNTER — Ambulatory Visit
Admission: RE | Admit: 2016-02-22 | Discharge: 2016-02-22 | Disposition: A | Discharge status: Home / Self Care | Payer: BLUE CROSS/BLUE SHIELD | Source: Ambulatory Visit | Attending: Physician Assistant | Admitting: Physician Assistant

## 2016-02-22 DIAGNOSIS — Z1231 Encounter for screening mammogram for malignant neoplasm of breast: Secondary | ICD-10-CM

## 2016-02-27 ENCOUNTER — Other Ambulatory Visit: Payer: Self-pay | Admitting: Physician Assistant

## 2016-02-27 DIAGNOSIS — R928 Other abnormal and inconclusive findings on diagnostic imaging of breast: Secondary | ICD-10-CM

## 2016-02-28 ENCOUNTER — Other Ambulatory Visit: Payer: Self-pay | Admitting: Physician Assistant

## 2016-03-05 ENCOUNTER — Ambulatory Visit
Admission: RE | Admit: 2016-03-05 | Discharge: 2016-03-05 | Disposition: A | Discharge status: Home / Self Care | Payer: BLUE CROSS/BLUE SHIELD | Source: Ambulatory Visit | Attending: Physician Assistant | Admitting: Physician Assistant

## 2016-03-05 DIAGNOSIS — R928 Other abnormal and inconclusive findings on diagnostic imaging of breast: Secondary | ICD-10-CM

## 2016-03-05 DIAGNOSIS — N6001 Solitary cyst of right breast: Secondary | ICD-10-CM | POA: Diagnosis not present

## 2016-03-06 ENCOUNTER — Other Ambulatory Visit: Payer: Self-pay | Admitting: Physician Assistant

## 2016-03-06 DIAGNOSIS — R0789 Other chest pain: Secondary | ICD-10-CM

## 2016-03-06 DIAGNOSIS — R200 Anesthesia of skin: Secondary | ICD-10-CM

## 2016-03-07 NOTE — Telephone Encounter (Signed)
01/2016 last ov 10/2015 last refill with 1 refill

## 2016-03-08 NOTE — Telephone Encounter (Signed)
Meds ordered this encounter  Medications  . meloxicam (MOBIC) 15 MG tablet    Sig: TAKE 1 TABLET(15 MG) BY MOUTH DAILY    Dispense:  30 tablet    Refill:  0

## 2016-04-01 ENCOUNTER — Other Ambulatory Visit: Payer: Self-pay | Admitting: Physician Assistant

## 2016-04-01 DIAGNOSIS — R0789 Other chest pain: Secondary | ICD-10-CM

## 2016-04-01 DIAGNOSIS — R200 Anesthesia of skin: Secondary | ICD-10-CM

## 2016-04-15 ENCOUNTER — Other Ambulatory Visit: Payer: Self-pay | Admitting: Physician Assistant

## 2016-04-15 DIAGNOSIS — E119 Type 2 diabetes mellitus without complications: Secondary | ICD-10-CM

## 2016-04-15 DIAGNOSIS — Z794 Long term (current) use of insulin: Principal | ICD-10-CM

## 2016-05-01 ENCOUNTER — Ambulatory Visit (INDEPENDENT_AMBULATORY_CARE_PROVIDER_SITE_OTHER): Payer: BLUE CROSS/BLUE SHIELD | Admitting: Physician Assistant

## 2016-05-01 ENCOUNTER — Encounter: Payer: Self-pay | Admitting: Physician Assistant

## 2016-05-01 VITALS — BP 140/94 | HR 96 | Temp 98.1°F | Resp 16 | Ht 60.25 in | Wt 168.0 lb

## 2016-05-01 DIAGNOSIS — I10 Essential (primary) hypertension: Secondary | ICD-10-CM | POA: Diagnosis not present

## 2016-05-01 DIAGNOSIS — R718 Other abnormality of red blood cells: Secondary | ICD-10-CM

## 2016-05-01 DIAGNOSIS — Z794 Long term (current) use of insulin: Secondary | ICD-10-CM

## 2016-05-01 DIAGNOSIS — E119 Type 2 diabetes mellitus without complications: Secondary | ICD-10-CM

## 2016-05-01 DIAGNOSIS — E785 Hyperlipidemia, unspecified: Secondary | ICD-10-CM

## 2016-05-01 LAB — POCT GLYCOSYLATED HEMOGLOBIN (HGB A1C): Hemoglobin A1C: 7.8

## 2016-05-01 MED ORDER — INSULIN GLARGINE 100 UNIT/ML SOLOSTAR PEN: PEN_INJECTOR | SUBCUTANEOUS | 3 refills | Status: DC

## 2016-05-01 NOTE — Progress Notes (Signed)
Patient ID: [name], female    DOB: 1976/03/01, 41 y.o.   MRN: [id number]  PCP: Harrison Mons, PA-C  Chief Complaint  Patient presents with  . Follow-up    TIIDM    Subjective:   Presents for regular follow up of type 2 diabetes mellitus.  Pt is a 41yo Asian female who presents for regular follow up of her type 2 diabetes. Pt states that she is taking medications as directed, but is unsure if she is taking metformin. She does not measure her BG or BP at home. She denies changes in vision, cough, SOB, chest pain, palpitations, hypoglycemic episodes, peripheral neuropathy, polyuria, or polydipsia.  Pt complains of right calf pain x several years. She states that she experiences a cramping pain that occurs only at night. She denies alleviating or aggravating factors. She states that when she was pregnant, her doctor gave her a pill that helped with the cramping, but she can't remember the name or anything else about it. Denies pain when walking. She denies erythema, swelling, edema, warmth, tingling, or numbness.   Pt also complains of increased bruising over the last year. She states that she has many bruises on her legs and arms without known trauma. She denies bleeding gums or heavy periods.   Review of Systems In addition to that mentioned in HPI above: Const: Denies fever, chills, fatigue, or weight changes. Abd: Denies abd pain, nausea, vomiting, diarrhea, or constipation.  Neuro: Denies headache.  Patient Active Problem List   Diagnosis Date Noted  . RBC microcytosis 01/25/2016  . BMI 33.0-33.9,adult 10/18/2015  . Hyperlipidemia 03/03/2013  . HCV antibody positive 03/03/2013  . CVA (cerebral infarction) 02/04/2013  . Hypokalemia 01/31/2013  . Right sided weakness 01/30/2013  . HTN (hypertension) 01/30/2013  . DM (diabetes mellitus) ([place]) 01/30/2013     Prior to Admission medications   Medication Sig Start Date End Date Taking? Authorizing Provider  atorvastatin  (LIPITOR) 80 MG tablet Take 1 tablet (80 mg total) by mouth daily at 6 PM. 10/18/15  Yes [name] Jeffery, PA-C  B-D UF III MINI PEN NEEDLES 31G X 5 MM MISC USE DAILY AS DIRECTED TO INJECT INSULIN 12/09/15  Yes [name] Jeffery, PA-C  clopidogrel (PLAVIX) 75 MG tablet Take 1 tablet (75 mg total) by mouth daily. 07/12/15  Yes [name] Jeffery, PA-C  gabapentin (NEURONTIN) 100 MG capsule TAKE 3 CAPSULES BY MOUTH THREE TIMES DAILY 10/18/15  Yes [name] Jeffery, PA-C  Insulin Glargine (LANTUS SOLOSTAR) 100 UNIT/ML Solostar Pen ADMINISTER 15 UNITS UNDER THE SKIN DAILY AT 10:00 PM 10/18/15  Yes [name] Jeffery, PA-C  lisinopril (PRINIVIL,ZESTRIL) 10 MG tablet Take 1 tablet (10 mg total) by mouth daily. 07/12/15  Yes [name] Jeffery, PA-C  meloxicam (MOBIC) 15 MG tablet TAKE 1 TABLET(15 MG) BY MOUTH DAILY 04/04/16  Yes [name] Jeffery, PA-C  metFORMIN (GLUCOPHAGE) 500 MG tablet Take 1 tablet (500 mg total) by mouth daily with breakfast. 02/10/16  Yes [name] Jeffery, PA-C  omega-3 acid ethyl esters (LOVAZA) 1 g capsule Take 1 capsule (1 g total) by mouth 2 (two) times daily. 10/18/15  Yes [name] Jeffery, PA-C     No Known Allergies     Objective:  Physical Exam HEENT: PERRLA. Pulm: Good respiratory effort. CTAB. No wheezes, rales, or rhonchi. CV: RRR. No M/R/G Abd: Soft, Nontender, nondistended. Ext: Bruises at various stages of healing noted at lateral and medial aspects of lower legs bilaterally and on right forearm. No limit or pain in ROM  at ankles or knees bilaterally. Negative Homan's sign. 2+ pulses at posterior tibialis bilaterally. No pallor or poikilothermia noted.     Assessment & Plan:   1. Type 2 diabetes mellitus without complication, with long-term current use of insulin ([facility]) Pt advised to verify, once home, that she picked up and started the metformin from her last visit. If not, pt advised to go pick up prescription from pharmacy and begin today. Pt advised to take metformin with food to avoid GI  upset. Follow up in 3 months. - Comprehensive metabolic panel - POCT glycosylated hemoglobin (Hb A1C) - Insulin Glargine (LANTUS SOLOSTAR) 100 UNIT/ML Solostar Pen; ADMINISTER 25 UNITS UNDER THE SKIN DAILY AT 10:00 PM  Dispense: 15 mL; Refill: 3  2. Essential hypertension Pt advised to continue medications as directed and follow up in 3 months. - CBC with Differential/Platelet - Comprehensive metabolic panel  3. Hyperlipidemia, unspecified hyperlipidemia type - Lipid panel  4. RBC microcytosis Will assess for cause of easy and increased bruising.  - CBC with Differential/Platelet  5. Right calf pain Pt advised to drink plenty of water, taking a daily adult multivitamin, and try a heating pad on the area.  Lorella Nimrod, PA-S

## 2016-05-01 NOTE — Progress Notes (Signed)
Patient ID: [name], female    DOB: [birth year], 41 y.o.   MRN: [id number]  PCP: Harrison Mons, PA-C  Chief Complaint  Patient presents with  . Follow-up    TIIDM    Subjective:   Presents for evaluation of diabetes type 2. Today her translator is her ex-husband's uncle.  Generally, she feels well. No CP, SOB, HA, dizziness. No nausea/vomiting or diarrhea. No urinary urgency, frequency or burning. Cramping in the RIGHT calf x years. Occurs at night and wakes her from sleep. No pain during the day or with walking. No LE swelling. Recalls using a medication during pregnancy that helped, but doesn't recall the name.  Also notes increased bruising.  Last visit she was noted to have RBC microcytosis, but platelets were normal.  Unclear if she is taking the metformin. Did not bring her medications with her for review.  Review of Systems As above.    Patient Active Problem List   Diagnosis Date Noted  . RBC microcytosis 01/25/2016  . BMI 33.0-33.9,adult 10/18/2015  . Hyperlipidemia 03/03/2013  . HCV antibody positive 03/03/2013  . CVA (cerebral infarction) 02/04/2013  . Hypokalemia 01/31/2013  . Right sided weakness 01/30/2013  . HTN (hypertension) 01/30/2013  . DM (diabetes mellitus) ([place]) 01/30/2013     Prior to Admission medications   Medication Sig Start Date End Date Taking? Authorizing Provider  atorvastatin (LIPITOR) 80 MG tablet Take 1 tablet (80 mg total) by mouth daily at 6 PM. 10/18/15  Yes Adrien Shankar, PA-C  B-D UF III MINI PEN NEEDLES 31G X 5 MM MISC USE DAILY AS DIRECTED TO INJECT INSULIN 12/09/15  Yes Sujata Maines, PA-C  clopidogrel (PLAVIX) 75 MG tablet Take 1 tablet (75 mg total) by mouth daily. 07/12/15  Yes Aryannah Mohon, PA-C  gabapentin (NEURONTIN) 100 MG capsule TAKE 3 CAPSULES BY MOUTH THREE TIMES DAILY 10/18/15  Yes Aune Adami, PA-C  Insulin Glargine (LANTUS SOLOSTAR) 100 UNIT/ML Solostar Pen ADMINISTER 15 UNITS UNDER THE SKIN DAILY AT  10:00 PM 10/18/15  Yes Navie Lamoreaux, PA-C  lisinopril (PRINIVIL,ZESTRIL) 10 MG tablet Take 1 tablet (10 mg total) by mouth daily. 07/12/15  Yes Sagal Gayton, PA-C  meloxicam (MOBIC) 15 MG tablet TAKE 1 TABLET(15 MG) BY MOUTH DAILY 04/04/16  Yes Nixon Sparr, PA-C  metFORMIN (GLUCOPHAGE) 500 MG tablet Take 1 tablet (500 mg total) by mouth daily with breakfast. 02/10/16  Yes Keishana Klinger, PA-C  omega-3 acid ethyl esters (LOVAZA) 1 g capsule Take 1 capsule (1 g total) by mouth 2 (two) times daily. 10/18/15  Yes Kalene Cutler, PA-C     No Known Allergies     Objective:  Physical Exam  Constitutional: She is oriented to person, place, and time. She appears well-developed and well-nourished. She is active and cooperative. No distress.  BP (!) 140/94 (BP Location: Right Arm, Patient Position: Sitting, Cuff Size: Small)   Pulse 96   Temp 98.1 F (36.7 C) (Oral)   Resp 16   Ht 5' 0.25" (1.53 m)   Wt 168 lb (76.2 kg)   LMP 04/24/2016   SpO2 97%   BMI 32.54 kg/m   HENT:  Head: Normocephalic and atraumatic.  Right Ear: Hearing normal.  Left Ear: Hearing normal.  Eyes: Conjunctivae are normal. No scleral icterus.  Neck: Normal range of motion. Neck supple. No thyromegaly present.  Cardiovascular: Normal rate, regular rhythm and normal heart sounds.   Pulses:      Radial pulses are 2+ on the right  side, and 2+ on the left side.  Pulmonary/Chest: Effort normal and breath sounds normal.  Musculoskeletal:       Right lower leg: Normal.       Left lower leg: Normal.  Lymphadenopathy:       Head (right side): No tonsillar, no preauricular, no posterior auricular and no occipital adenopathy present.       Head (left side): No tonsillar, no preauricular, no posterior auricular and no occipital adenopathy present.    She has no cervical adenopathy.       Right: No supraclavicular adenopathy present.       Left: No supraclavicular adenopathy present.  Neurological: She is alert and oriented  to person, place, and time. No sensory deficit.  Skin: Skin is warm, dry and intact. No rash noted. No cyanosis or erythema. Nails show no clubbing.  Psychiatric: She has a normal mood and affect. Her speech is normal and behavior is normal.       Wt Readings from Last 3 Encounters:  05/01/16 168 lb (76.2 kg)  01/24/16 171 lb 12.8 oz (77.9 kg)  10/18/15 173 lb (78.5 kg)    Results for orders placed or performed in visit on 05/01/16  POCT glycosylated hemoglobin (Hb A1C)  Result Value Ref Range   Hemoglobin A1C 7.8   down from 9.4% 3 months ago.     Assessment & Plan:   1. Type 2 diabetes mellitus without complication, with long-term current use of insulin ([place]) Once home, check to make sure that she is taking the metformin. If not, pick it up from the pharmacy. If so, she's to let me know so that I can increase the dose.    - Comprehensive metabolic panel - POCT glycosylated hemoglobin (Hb A1C) - Insulin Glargine (LANTUS SOLOSTAR) 100 UNIT/ML Solostar Pen; ADMINISTER 25 UNITS UNDER THE SKIN DAILY AT 10:00 PM  Dispense: 15 mL; Refill: 3  2. Essential hypertension Not well controlled today. Usually controlled. Elect against increasing regimen today, but if remains elevated at next visit, will increase dose. - CBC with Differential/Platelet - Comprehensive metabolic panel  3. Hyperlipidemia, unspecified hyperlipidemia type Await labs. Adjust regimen as indicated by results. - Lipid panel  4. RBC microcytosis Await labs. Adjust regimen as indicated by results. - CBC with Differential/Platelet  5. Leg cramping Trial of MVI each day. Increase oral hydration and heating pad. Consider diphenhydramine at HS if ineffective. Also consider verapamil or diltiazem, if additional blood pressure lowering would be appropriate at that point.   Fara Chute, PA-C Physician Assistant-Certified [facility]

## 2016-05-01 NOTE — Patient Instructions (Addendum)
Please schedule an eye exam. Specialists I recommend: [facility]  [address], [place], [place] [address]  Phone: [phone]  [facility] [place], [place], [place] [address]  Phone: [phone]  [facility]. [address] [place], [place] [address]  Phone: [phone]  For the leg cramps: 1. Increase your water to 64 ounces each day. 2. Apply a heating pad to the area for 15-20 minutes as needed for cramps. 3. Start a daily multivitamin. If these measures do not help, let me know.  Take the metformin with food to reduce the GI upset. This is the NEW one I sent in in 02/2016. Make sure that you have it. If not, it's at the pharmacy, and you just need to ask for it.  Continue the other medications as before.    IF you received an x-ray today, you will receive an invoice from [facility]. Please contact [facility] at [phone] with questions or concerns regarding your invoice.   IF you received labwork today, you will receive an invoice from [facility]. Please contact [facility] at [phone] with questions or concerns regarding your invoice.   Our billing staff will not be able to assist you with questions regarding bills from these companies.  You will be contacted with the lab results as soon as they are available. The fastest way to get your results is to activate your My Chart account. Instructions are located on the last page of this paperwork. If you have not heard from [place] regarding the results in 2 weeks, please contact this office.

## 2016-05-02 LAB — CBC WITH DIFFERENTIAL/PLATELET
Basophils Absolute: 0 10*3/uL (ref 0.0–0.2)
Basos: 1 %
EOS (ABSOLUTE): 0.1 10*3/uL (ref 0.0–0.4)
Eos: 2 %
Hematocrit: 39.5 % (ref 34.0–46.6)
Hemoglobin: 12.4 g/dL (ref 11.1–15.9)
Immature Grans (Abs): 0 10*3/uL (ref 0.0–0.1)
Immature Granulocytes: 1 %
Lymphocytes Absolute: 1.5 10*3/uL (ref 0.7–3.1)
Lymphs: 26 %
MCH: 25.5 pg — ABNORMAL LOW (ref 26.6–33.0)
MCHC: 31.4 g/dL — ABNORMAL LOW (ref 31.5–35.7)
MCV: 81 fL (ref 79–97)
Monocytes Absolute: 0.7 10*3/uL (ref 0.1–0.9)
Monocytes: 13 %
Neutrophils Absolute: 3.3 10*3/uL (ref 1.4–7.0)
Neutrophils: 57 %
Platelets: 395 10*3/uL — ABNORMAL HIGH (ref 150–379)
RBC: 4.87 x10E6/uL (ref 3.77–5.28)
RDW: 15.7 % — ABNORMAL HIGH (ref 12.3–15.4)
WBC: 5.7 10*3/uL (ref 3.4–10.8)

## 2016-05-02 LAB — COMPREHENSIVE METABOLIC PANEL
ALT: 16 IU/L (ref 0–32)
AST: 13 IU/L (ref 0–40)
Albumin/Globulin Ratio: 1.3 (ref 1.2–2.2)
Albumin: 4.1 g/dL (ref 3.5–5.5)
Alkaline Phosphatase: 57 IU/L (ref 39–117)
BUN/Creatinine Ratio: 16 (ref 9–23)
BUN: 12 mg/dL (ref 6–24)
Bilirubin Total: 0.6 mg/dL (ref 0.0–1.2)
CO2: 22 mmol/L (ref 18–29)
Calcium: 9.5 mg/dL (ref 8.7–10.2)
Chloride: 103 mmol/L (ref 96–106)
Creatinine, Ser: 0.73 mg/dL (ref 0.57–1.00)
GFR calc Af Amer: 117 mL/min/{1.73_m2} (ref >59)
GFR calc non Af Amer: 102 mL/min/{1.73_m2} (ref >59)
Globulin, Total: 3.1 g/dL (ref 1.5–4.5)
Glucose: 138 mg/dL — ABNORMAL HIGH (ref 65–99)
Potassium: 5.1 mmol/L (ref 3.5–5.2)
Sodium: 142 mmol/L (ref 134–144)
Total Protein: 7.2 g/dL (ref 6.0–8.5)

## 2016-05-02 LAB — LIPID PANEL
Chol/HDL Ratio: 3.6 ratio units (ref 0.0–4.4)
Cholesterol, Total: 157 mg/dL (ref 100–199)
HDL: 44 mg/dL (ref >39)
LDL Calculated: 94 mg/dL (ref 0–99)
Triglycerides: 97 mg/dL (ref 0–149)
VLDL Cholesterol Cal: 19 mg/dL (ref 5–40)

## 2016-05-04 ENCOUNTER — Encounter: Payer: Self-pay | Admitting: Physician Assistant

## 2016-06-25 ENCOUNTER — Other Ambulatory Visit: Payer: Self-pay | Admitting: Physician Assistant

## 2016-07-01 ENCOUNTER — Other Ambulatory Visit: Payer: Self-pay | Admitting: Physician Assistant

## 2016-07-03 ENCOUNTER — Other Ambulatory Visit: Payer: Self-pay | Admitting: Physician Assistant

## 2016-07-03 DIAGNOSIS — E785 Hyperlipidemia, unspecified: Secondary | ICD-10-CM

## 2016-07-04 NOTE — Telephone Encounter (Signed)
Pt calling for a 90 day refill on Plavix

## 2016-07-05 MED ORDER — CLOPIDOGREL BISULFATE 75 MG PO TABS: 75.0000 mg | ORAL_TABLET | Freq: Every day | ORAL | 0 refills | Status: DC

## 2016-07-06 NOTE — Telephone Encounter (Signed)
Pt calling for Plavix and omega 3 refill

## 2016-07-09 ENCOUNTER — Other Ambulatory Visit: Payer: Self-pay | Admitting: Emergency Medicine

## 2016-07-09 ENCOUNTER — Other Ambulatory Visit: Payer: Self-pay | Admitting: Physician Assistant

## 2016-07-09 DIAGNOSIS — I10 Essential (primary) hypertension: Secondary | ICD-10-CM

## 2016-07-30 ENCOUNTER — Other Ambulatory Visit: Payer: Self-pay | Admitting: Physician Assistant

## 2016-07-30 DIAGNOSIS — R0789 Other chest pain: Secondary | ICD-10-CM

## 2016-07-30 DIAGNOSIS — R200 Anesthesia of skin: Secondary | ICD-10-CM

## 2016-08-02 ENCOUNTER — Other Ambulatory Visit: Payer: Self-pay | Admitting: Physician Assistant

## 2016-08-02 DIAGNOSIS — E785 Hyperlipidemia, unspecified: Secondary | ICD-10-CM

## 2016-08-05 ENCOUNTER — Other Ambulatory Visit: Payer: Self-pay | Admitting: Physician Assistant

## 2016-08-05 DIAGNOSIS — R0789 Other chest pain: Secondary | ICD-10-CM

## 2016-08-05 DIAGNOSIS — R200 Anesthesia of skin: Secondary | ICD-10-CM

## 2016-08-07 ENCOUNTER — Encounter: Payer: Self-pay | Admitting: Physician Assistant

## 2016-08-07 ENCOUNTER — Ambulatory Visit (INDEPENDENT_AMBULATORY_CARE_PROVIDER_SITE_OTHER): Payer: BLUE CROSS/BLUE SHIELD | Admitting: Physician Assistant

## 2016-08-07 VITALS — BP 140/80 | HR 86 | Temp 98.1°F | Resp 16 | Ht 60.0 in | Wt 168.0 lb

## 2016-08-07 DIAGNOSIS — E119 Type 2 diabetes mellitus without complications: Secondary | ICD-10-CM

## 2016-08-07 DIAGNOSIS — Z6833 Body mass index (BMI) 33.0-33.9, adult: Secondary | ICD-10-CM | POA: Diagnosis not present

## 2016-08-07 DIAGNOSIS — I10 Essential (primary) hypertension: Secondary | ICD-10-CM

## 2016-08-07 DIAGNOSIS — E785 Hyperlipidemia, unspecified: Secondary | ICD-10-CM | POA: Diagnosis not present

## 2016-08-07 DIAGNOSIS — R718 Other abnormality of red blood cells: Secondary | ICD-10-CM | POA: Diagnosis not present

## 2016-08-07 DIAGNOSIS — Z794 Long term (current) use of insulin: Secondary | ICD-10-CM

## 2016-08-07 LAB — CBC WITH DIFFERENTIAL/PLATELET
Basophils Absolute: 0 10*3/uL (ref 0.0–0.2)
Basos: 0 %
EOS (ABSOLUTE): 0.1 10*3/uL (ref 0.0–0.4)
Eos: 1 %
Hematocrit: 39.4 % (ref 34.0–46.6)
Hemoglobin: 12 g/dL (ref 11.1–15.9)
Immature Grans (Abs): 0 10*3/uL (ref 0.0–0.1)
Immature Granulocytes: 0 %
Lymphocytes Absolute: 1.1 10*3/uL (ref 0.7–3.1)
Lymphs: 14 %
MCH: 23.6 pg — ABNORMAL LOW (ref 26.6–33.0)
MCHC: 30.5 g/dL — ABNORMAL LOW (ref 31.5–35.7)
MCV: 77 fL — ABNORMAL LOW (ref 79–97)
Monocytes Absolute: 0.7 10*3/uL (ref 0.1–0.9)
Monocytes: 9 %
Neutrophils Absolute: 5.7 10*3/uL (ref 1.4–7.0)
Neutrophils: 76 %
Platelets: 377 10*3/uL (ref 150–379)
RBC: 5.09 x10E6/uL (ref 3.77–5.28)
RDW: 15.9 % — ABNORMAL HIGH (ref 12.3–15.4)
WBC: 7.5 10*3/uL (ref 3.4–10.8)

## 2016-08-07 LAB — COMPREHENSIVE METABOLIC PANEL
ALT: 13 IU/L (ref 0–32)
AST: 13 IU/L (ref 0–40)
Albumin/Globulin Ratio: 1.4 (ref 1.2–2.2)
Albumin: 4.3 g/dL (ref 3.5–5.5)
Alkaline Phosphatase: 55 IU/L (ref 39–117)
BUN/Creatinine Ratio: 18 (ref 9–23)
BUN: 13 mg/dL (ref 6–24)
Bilirubin Total: 0.5 mg/dL (ref 0.0–1.2)
CO2: 23 mmol/L (ref 18–29)
Calcium: 9.5 mg/dL (ref 8.7–10.2)
Chloride: 102 mmol/L (ref 96–106)
Creatinine, Ser: 0.74 mg/dL (ref 0.57–1.00)
GFR calc Af Amer: 116 mL/min/{1.73_m2} (ref >59)
GFR calc non Af Amer: 100 mL/min/{1.73_m2} (ref >59)
Globulin, Total: 3 g/dL (ref 1.5–4.5)
Glucose: 152 mg/dL — ABNORMAL HIGH (ref 65–99)
Potassium: 4.5 mmol/L (ref 3.5–5.2)
Sodium: 140 mmol/L (ref 134–144)
Total Protein: 7.3 g/dL (ref 6.0–8.5)

## 2016-08-07 LAB — HEMOGLOBIN A1C
Est. average glucose Bld gHb Est-mCnc: 154 mg/dL
Hgb A1c MFr Bld: 7 % — ABNORMAL HIGH (ref 4.8–5.6)

## 2016-08-07 LAB — LIPID PANEL
Chol/HDL Ratio: 2.8 ratio (ref 0.0–4.4)
Cholesterol, Total: 152 mg/dL (ref 100–199)
HDL: 55 mg/dL (ref >39)
LDL Calculated: 58 mg/dL (ref 0–99)
Triglycerides: 193 mg/dL — ABNORMAL HIGH (ref 0–149)
VLDL Cholesterol Cal: 39 mg/dL (ref 5–40)

## 2016-08-07 NOTE — Assessment & Plan Note (Signed)
Continue current treatment. If LDL >70, plan to add Zetia to atorvastatin and Lovaza.

## 2016-08-07 NOTE — Assessment & Plan Note (Signed)
Update CBC.

## 2016-08-07 NOTE — Patient Instructions (Signed)
     IF you received an x-ray today, you will receive an invoice from [facility]. Please contact [facility] at [phone] with questions or concerns regarding your invoice.   IF you received labwork today, you will receive an invoice from [facility]. Please contact [facility] at [phone] with questions or concerns regarding your invoice.   Our billing staff will not be able to assist you with questions regarding bills from these companies.  You will be contacted with the lab results as soon as they are available. The fastest way to get your results is to activate your My Chart account. Instructions are located on the last page of this paperwork. If you have not heard from us regarding the results in 2 weeks, please contact this office.     

## 2016-08-07 NOTE — Assessment & Plan Note (Signed)
Await A1C. Consider INCREASING metformin dose if >7%. Encouraged "grazing" rather than just 2 small meals/day.

## 2016-08-07 NOTE — Assessment & Plan Note (Signed)
Controlled, barely. Continue current treatment, exercise. May need increased lisinopril dose at next visit.

## 2016-08-07 NOTE — Assessment & Plan Note (Signed)
Counseled on "grazing," rather than 2 small meals daily. Encouraged continued exercise.

## 2016-08-07 NOTE — Progress Notes (Signed)
Patient ID: [name], female    DOB: 03-31-1976, 41 y.o.   MRN: [id number]  PCP: Harrison Mons, PA-C  Chief Complaint  Patient presents with  . Follow-up    3 month/ DM    Subjective:   Presents for evaluation of Diabetes.  She feels well, and denies any problems. No CP, HA, SOB, dizziness, nausea/vomiting, diarrhea, abdominal pain, urinary symptoms.  Not checking glucose at home. Tolerating her medications without adverse effects. Is walking with friends for 60 minutes every night with friends x 6 weeks. Frustrated that the regular exercise has not resulted in weight loss. She is eating 2 small meals/day.   Review of Systems  Constitutional: Negative for activity change, appetite change, fatigue and unexpected weight change.  HENT: Negative for congestion, dental problem, ear pain, hearing loss, mouth sores, postnasal drip, rhinorrhea, sneezing, sore throat, tinnitus and trouble swallowing.   Eyes: Negative for photophobia, pain, redness and visual disturbance.  Respiratory: Negative for cough, chest tightness and shortness of breath.   Cardiovascular: Negative for chest pain, palpitations and leg swelling.  Gastrointestinal: Negative for abdominal pain, blood in stool, constipation, diarrhea, nausea and vomiting.  Endocrine: Negative for cold intolerance, heat intolerance, polydipsia, polyphagia and polyuria.  Genitourinary: Negative for dysuria, frequency, hematuria and urgency.  Musculoskeletal: Negative for arthralgias, gait problem, myalgias and neck stiffness.  Skin: Negative for rash.  Neurological: Negative for dizziness, speech difficulty, weakness, light-headedness, numbness and headaches.  Hematological: Negative for adenopathy.  Psychiatric/Behavioral: Negative for confusion and sleep disturbance. The patient is not nervous/anxious.        Patient Active Problem List   Diagnosis Date Noted  . RBC microcytosis 01/25/2016  . BMI 33.0-33.9,adult  10/18/2015  . Hyperlipidemia 03/03/2013  . HCV antibody positive 03/03/2013  . CVA (cerebral infarction) 02/04/2013  . Hypokalemia 01/31/2013  . Right sided weakness 01/30/2013  . HTN (hypertension) 01/30/2013  . DM (diabetes mellitus) ([place]) 01/30/2013     Prior to Admission medications   Medication Sig Start Date End Date Taking? Authorizing Provider  atorvastatin (LIPITOR) 80 MG tablet Take 1 tablet (80 mg total) by mouth daily at 6 PM. 10/18/15  Yes Lynard Postlewait, PA-C  B-D UF III MINI PEN NEEDLES 31G X 5 MM MISC USE DAILY AS DIRECTED TO INJECT INSULIN 06/27/16  Yes Zikeria Keough, PA-C  clopidogrel (PLAVIX) 75 MG tablet Take 1 tablet (75 mg total) by mouth daily. 07/05/16  Yes Sarah Alleen Borne, PA-C  gabapentin (NEURONTIN) 100 MG capsule TAKE 3 CAPSULES BY MOUTH THREE TIMES DAILY 10/18/15  Yes Leilanee Righetti, PA-C  Insulin Glargine (LANTUS SOLOSTAR) 100 UNIT/ML Solostar Pen ADMINISTER 25 UNITS UNDER THE SKIN DAILY AT 10:00 PM 05/01/16  Yes Sadiyah Kangas, PA-C  lisinopril (PRINIVIL,ZESTRIL) 10 MG tablet TAKE 1 TABLET(10 MG) BY MOUTH DAILY 07/10/16  Yes Chelsy Parrales, PA-C  meloxicam (MOBIC) 15 MG tablet TAKE 1 TABLET(15 MG) BY MOUTH DAILY 08/06/16  Yes Kobie Matkins, PA-C  metFORMIN (GLUCOPHAGE) 500 MG tablet Take 1 tablet (500 mg total) by mouth daily with breakfast. 02/10/16  Yes Andros Channing, PA-C  omega-3 acid ethyl esters (LOVAZA) 1 g capsule TAKE 2 CAPSULES BY MOUTH TWICE DAILY 08/06/16  Yes Kiandria Clum, PA-C     No Known Allergies     Objective:  Physical Exam  Constitutional: She is oriented to person, place, and time. She appears well-developed and well-nourished. She is active and cooperative. No distress.  BP 140/80   Pulse 86   Temp 98.1 F (  36.7 C) (Oral)   Resp 16   Ht 5' (1.524 m)   Wt 168 lb (76.2 kg)   LMP 08/01/2016   SpO2 99%   BMI 32.81 kg/m   HENT:  Head: Normocephalic and atraumatic.  Right Ear: Hearing normal.  Left Ear: Hearing normal.  Eyes:  Conjunctivae are normal. No scleral icterus.  Neck: Normal range of motion. Neck supple. No thyromegaly present.  Cardiovascular: Normal rate, regular rhythm and normal heart sounds.   Pulses:      Radial pulses are 2+ on the right side, and 2+ on the left side.  Pulmonary/Chest: Effort normal and breath sounds normal.  Lymphadenopathy:       Head (right side): No tonsillar, no preauricular, no posterior auricular and no occipital adenopathy present.       Head (left side): No tonsillar, no preauricular, no posterior auricular and no occipital adenopathy present.    She has no cervical adenopathy.       Right: No supraclavicular adenopathy present.       Left: No supraclavicular adenopathy present.  Neurological: She is alert and oriented to person, place, and time. No sensory deficit.  Skin: Skin is warm, dry and intact. No rash noted. No cyanosis or erythema. Nails show no clubbing.  Psychiatric: She has a normal mood and affect. Her speech is normal and behavior is normal.    Wt Readings from Last 3 Encounters:  08/07/16 168 lb (76.2 kg)  05/01/16 168 lb (76.2 kg)  01/24/16 171 lb 12.8 oz (77.9 kg)          Assessment & Plan:   Problem List Items Addressed This Visit    HTN (hypertension) - Primary    Controlled, barely. Continue current treatment, exercise. May need increased lisinopril dose at next visit.      Relevant Orders   CBC with Differential/Platelet   Comprehensive metabolic panel   DM (diabetes mellitus) ([place])    Await A1C. Consider INCREASING metformin dose if >7%. Encouraged "grazing" rather than just 2 small meals/day.      Relevant Orders   HM Diabetes Foot Exam (Completed)   Comprehensive metabolic panel   Hemoglobin A1c   Microalbumin, urine   Hyperlipidemia    Continue current treatment. If LDL >70, plan to add Zetia to atorvastatin and Lovaza.      Relevant Orders   Comprehensive metabolic panel   Lipid panel   BMI 33.0-33.9,adult     Counseled on "grazing," rather than 2 small meals daily. Encouraged continued exercise.      RBC microcytosis    Update CBC.      Relevant Orders   CBC with Differential/Platelet       Return in about 3 months (around 11/07/2016) for re-evaluation of diabetes, blood pressure and cholesterol.   Fara Chute, PA-C [facility]

## 2016-08-08 ENCOUNTER — Encounter: Payer: Self-pay | Admitting: Physician Assistant

## 2016-08-08 LAB — MICROALBUMIN, URINE: Microalbumin, Urine: <3 ug/mL

## 2016-09-04 ENCOUNTER — Other Ambulatory Visit: Payer: Self-pay | Admitting: Physician Assistant

## 2016-09-04 DIAGNOSIS — R200 Anesthesia of skin: Secondary | ICD-10-CM

## 2016-09-04 DIAGNOSIS — R0789 Other chest pain: Secondary | ICD-10-CM

## 2016-09-04 DIAGNOSIS — E785 Hyperlipidemia, unspecified: Secondary | ICD-10-CM

## 2016-09-04 DIAGNOSIS — G8110 Spastic hemiplegia affecting unspecified side: Secondary | ICD-10-CM

## 2016-09-05 ENCOUNTER — Other Ambulatory Visit: Payer: Self-pay | Admitting: Physician Assistant

## 2016-09-05 DIAGNOSIS — E785 Hyperlipidemia, unspecified: Secondary | ICD-10-CM

## 2016-09-05 NOTE — Telephone Encounter (Signed)
08/07/16 last ov

## 2016-09-05 NOTE — Telephone Encounter (Signed)
Meds ordered this encounter  Medications  . gabapentin (NEURONTIN) 100 MG capsule    Sig: TAKE 3 CAPSULES BY MOUTH THREE TIMES DAILY    Dispense:  270 capsule    Refill:  0  . meloxicam (MOBIC) 15 MG tablet    Sig: TAKE 1 TABLET(15 MG) BY MOUTH DAILY    Dispense:  30 tablet    Refill:  0

## 2016-09-20 ENCOUNTER — Other Ambulatory Visit: Payer: Self-pay | Admitting: Physician Assistant

## 2016-09-22 ENCOUNTER — Other Ambulatory Visit: Payer: Self-pay | Admitting: Physician Assistant

## 2016-09-22 DIAGNOSIS — Z794 Long term (current) use of insulin: Principal | ICD-10-CM

## 2016-09-22 DIAGNOSIS — E119 Type 2 diabetes mellitus without complications: Secondary | ICD-10-CM

## 2016-10-02 ENCOUNTER — Other Ambulatory Visit: Payer: Self-pay | Admitting: Physician Assistant

## 2016-10-02 DIAGNOSIS — R0789 Other chest pain: Secondary | ICD-10-CM

## 2016-10-02 DIAGNOSIS — R200 Anesthesia of skin: Secondary | ICD-10-CM

## 2016-10-04 ENCOUNTER — Other Ambulatory Visit: Payer: Self-pay | Admitting: Physician Assistant

## 2016-10-04 DIAGNOSIS — R200 Anesthesia of skin: Secondary | ICD-10-CM

## 2016-10-04 DIAGNOSIS — R0789 Other chest pain: Secondary | ICD-10-CM

## 2016-10-07 ENCOUNTER — Other Ambulatory Visit: Payer: Self-pay | Admitting: Physician Assistant

## 2016-10-07 DIAGNOSIS — I10 Essential (primary) hypertension: Secondary | ICD-10-CM

## 2016-10-08 ENCOUNTER — Other Ambulatory Visit: Payer: Self-pay | Admitting: Physician Assistant

## 2016-10-08 DIAGNOSIS — G8110 Spastic hemiplegia affecting unspecified side: Secondary | ICD-10-CM

## 2016-10-16 ENCOUNTER — Other Ambulatory Visit: Payer: Self-pay | Admitting: Physician Assistant

## 2016-10-16 DIAGNOSIS — E119 Type 2 diabetes mellitus without complications: Secondary | ICD-10-CM

## 2016-10-16 DIAGNOSIS — E785 Hyperlipidemia, unspecified: Secondary | ICD-10-CM

## 2016-10-16 DIAGNOSIS — Z794 Long term (current) use of insulin: Principal | ICD-10-CM

## 2016-11-06 ENCOUNTER — Other Ambulatory Visit: Payer: Self-pay | Admitting: Physician Assistant

## 2016-11-06 DIAGNOSIS — R0789 Other chest pain: Secondary | ICD-10-CM

## 2016-11-06 DIAGNOSIS — G8110 Spastic hemiplegia affecting unspecified side: Secondary | ICD-10-CM

## 2016-11-06 DIAGNOSIS — R200 Anesthesia of skin: Secondary | ICD-10-CM

## 2016-11-13 ENCOUNTER — Ambulatory Visit: Payer: BLUE CROSS/BLUE SHIELD | Admitting: Physician Assistant

## 2016-11-27 ENCOUNTER — Encounter: Payer: Self-pay | Admitting: Physician Assistant

## 2016-11-27 ENCOUNTER — Ambulatory Visit (INDEPENDENT_AMBULATORY_CARE_PROVIDER_SITE_OTHER): Payer: BLUE CROSS/BLUE SHIELD | Admitting: Physician Assistant

## 2016-11-27 VITALS — BP 112/76 | HR 89 | Temp 98.1°F | Resp 18 | Ht 60.0 in | Wt 159.2 lb

## 2016-11-27 DIAGNOSIS — I10 Essential (primary) hypertension: Secondary | ICD-10-CM

## 2016-11-27 DIAGNOSIS — B182 Chronic viral hepatitis C: Secondary | ICD-10-CM | POA: Diagnosis not present

## 2016-11-27 DIAGNOSIS — R200 Anesthesia of skin: Secondary | ICD-10-CM

## 2016-11-27 DIAGNOSIS — E785 Hyperlipidemia, unspecified: Secondary | ICD-10-CM

## 2016-11-27 DIAGNOSIS — G8110 Spastic hemiplegia affecting unspecified side: Secondary | ICD-10-CM

## 2016-11-27 DIAGNOSIS — E119 Type 2 diabetes mellitus without complications: Secondary | ICD-10-CM | POA: Diagnosis not present

## 2016-11-27 DIAGNOSIS — R0789 Other chest pain: Secondary | ICD-10-CM

## 2016-11-27 DIAGNOSIS — R718 Other abnormality of red blood cells: Secondary | ICD-10-CM

## 2016-11-27 DIAGNOSIS — Z794 Long term (current) use of insulin: Secondary | ICD-10-CM

## 2016-11-27 MED ORDER — INSULIN GLARGINE 100 UNIT/ML SOLOSTAR PEN: PEN_INJECTOR | SUBCUTANEOUS | 99 refills | Status: AC

## 2016-11-27 MED ORDER — EZETIMIBE 10 MG PO TABS: 10.0000 mg | ORAL_TABLET | Freq: Every day | ORAL | 3 refills | Status: DC

## 2016-11-27 MED ORDER — CLOPIDOGREL BISULFATE 75 MG PO TABS
75.0000 mg | ORAL_TABLET | Freq: Every day | ORAL | 3 refills | Status: DC
Start: 2016-11-27 — End: 2019-02-16

## 2016-11-27 MED ORDER — MELOXICAM 15 MG PO TABS: ORAL_TABLET | ORAL | 3 refills | Status: DC

## 2016-11-27 MED ORDER — ATORVASTATIN CALCIUM 80 MG PO TABS: ORAL_TABLET | ORAL | 3 refills | Status: DC

## 2016-11-27 MED ORDER — OMEGA-3-ACID ETHYL ESTERS 1 G PO CAPS: 2.0000 | ORAL_CAPSULE | Freq: Two times a day (BID) | ORAL | 3 refills | Status: DC

## 2016-11-27 MED ORDER — GABAPENTIN 100 MG PO CAPS: 300.0000 mg | ORAL_CAPSULE | Freq: Three times a day (TID) | ORAL | 3 refills | Status: DC

## 2016-11-27 NOTE — Progress Notes (Signed)
Patient ID: [name], female    DOB: 21-Jul-1975, 41 y.o.   MRN: [id number]  PCP: Harrison Mons, PA-C  Chief Complaint  Patient presents with  . Diabetes  . Follow-up    Subjective:   Presents for evaluation of diabetes.  In general, she is doing well. She denies CP, SOB, HA, dizziness, nausea, vomiting, diarrhea, new muscle/joint pain.  Continues to have episodic abdominal and chest wall pain. Meloxicam daily generally addresses both of these.  Tolerating her medications without adverse effects.  Diabetes control have been improving again after loss of control last fall. She starting walking for exercise, and A1C was back down to 7% in May. LDL cholesterol <70 last check. 2 days of itchy rash on the anterior and lateral neck. No new products.  Review of Systems As above.    Patient Active Problem List   Diagnosis Date Noted  . Chronic hepatitis C without hepatic coma ([place]) 11/27/2016  . RBC microcytosis 01/25/2016  . BMI 33.0-33.9,adult 10/18/2015  . Hyperlipidemia 03/03/2013  . HCV antibody positive 03/03/2013  . CVA (cerebral infarction) 02/04/2013  . Hypokalemia 01/31/2013  . Right sided weakness 01/30/2013  . HTN (hypertension) 01/30/2013  . DM (diabetes mellitus) ([place]) 01/30/2013     Prior to Admission medications   Medication Sig Start Date End Date Taking? Authorizing Provider  atorvastatin (LIPITOR) 80 MG tablet TAKE 1 TABLET(80 MG) BY MOUTH DAILY AT 6 PM 10/16/16  Yes Mariesha Venturella, PA-C  B-D UF III MINI PEN NEEDLES 31G X 5 MM MISC USE DAILY AS DIRECTED TO INJECT INSULIN 09/21/16  Yes Emmersyn Kratzke, PA-C  clopidogrel (PLAVIX) 75 MG tablet Take 1 tablet (75 mg total) by mouth daily. 07/05/16  Yes Weber, Sarah L, PA-C  ezetimibe (ZETIA) 10 MG tablet TAKE 1 TABLET BY MOUTH DAILY 09/05/16  Yes Chrystian Cupples, PA-C  gabapentin (NEURONTIN) 100 MG capsule TAKE 3 CAPSULES BY MOUTH THREE TIMES DAILY 11/07/16  Yes Harrison Mons, PA-C  LANTUS SOLOSTAR 100  UNIT/ML Solostar Pen ADMINISTER 25 UNITS UNDER THE SKIN DAILY AT 10:00PM 09/24/16  Yes Neeko Pharo, PA-C  lisinopril (PRINIVIL,ZESTRIL) 10 MG tablet TAKE 1 TABLET(10 MG) BY MOUTH DAILY 10/08/16  Yes Greysin Medlen, PA-C  meloxicam (MOBIC) 15 MG tablet TAKE 1 TABLET(15 MG) BY MOUTH DAILY 11/07/16  Yes Annaleah Arata, PA-C  metFORMIN (GLUCOPHAGE) 500 MG tablet Take 1 tablet (500 mg total) by mouth daily with breakfast. 02/10/16  Yes Talyn Dessert, PA-C  omega-3 acid ethyl esters (LOVAZA) 1 g capsule TAKE 2 CAPSULES BY MOUTH TWICE DAILY 09/06/16  Yes Jamarious Febo, PA-C     No Known Allergies     Objective:  Physical Exam  Constitutional: She is oriented to person, place, and time. She appears well-developed and well-nourished. She is active and cooperative. No distress.  BP 112/76 (BP Location: Right Arm, Patient Position: Sitting, Cuff Size: Large)   Pulse 89   Temp 98.1 F (36.7 C) (Oral)   Resp 18   Ht 5' (1.524 m)   Wt 159 lb 3.2 oz (72.2 kg)   LMP  (LMP Unknown)   SpO2 98%   BMI 31.09 kg/m   HENT:  Head: Normocephalic and atraumatic.  Right Ear: Hearing normal.  Left Ear: Hearing normal.  Eyes: Conjunctivae are normal. No scleral icterus.  Neck: Normal range of motion. Neck supple. No thyromegaly present.  Cardiovascular: Normal rate, regular rhythm and normal heart sounds.   Pulses:      Radial pulses are 2+  on the right side, and 2+ on the left side.  Pulmonary/Chest: Effort normal and breath sounds normal.  Lymphadenopathy:       Head (right side): No tonsillar, no preauricular, no posterior auricular and no occipital adenopathy present.       Head (left side): No tonsillar, no preauricular, no posterior auricular and no occipital adenopathy present.    She has no cervical adenopathy.       Right: No supraclavicular adenopathy present.       Left: No supraclavicular adenopathy present.  Neurological: She is alert and oriented to person, place, and time. No sensory  deficit.  Skin: Skin is warm, dry and intact. No rash noted. No cyanosis or erythema. Nails show no clubbing.  Psychiatric: She has a normal mood and affect. Her speech is normal and behavior is normal.       Assessment & Plan:   Problem List Items Addressed This Visit    HTN (hypertension)    Controlled. Continue current treatment.      Relevant Medications   atorvastatin (LIPITOR) 80 MG tablet   ezetimibe (ZETIA) 10 MG tablet   omega-3 acid ethyl esters (LOVAZA) 1 g capsule   Other Relevant Orders   CBC with Differential/Platelet   Comprehensive metabolic panel   DM (diabetes mellitus) ([place]) - Primary    Await labs. Adjust regimen as indicated by results.       Relevant Medications   Insulin Glargine (LANTUS SOLOSTAR) 100 UNIT/ML Solostar Pen   atorvastatin (LIPITOR) 80 MG tablet   Other Relevant Orders   Comprehensive metabolic panel   Hemoglobin A1c   Hyperlipidemia    Has been well controlled. Await labs. Adjust regimen as indicated by results.       Relevant Medications   atorvastatin (LIPITOR) 80 MG tablet   ezetimibe (ZETIA) 10 MG tablet   omega-3 acid ethyl esters (LOVAZA) 1 g capsule   Other Relevant Orders   Comprehensive metabolic panel   Lipid panel   RBC microcytosis    Await lab results.      Relevant Orders   CBC with Differential/Platelet   Chronic hepatitis C without hepatic coma ([place])    Reviewed outside records to update problem list. Patient was treated with 12 weeks of Harvoni and viral load remained undetectable at follow-up. Hepatitis C considered CURED. Will resolve this problem.       Other Visit Diagnoses    Spastic hemiplegia affecting dominant side (HCC)       Relevant Medications   gabapentin (NEURONTIN) 100 MG capsule   Bilateral hand numbness       Relevant Medications   meloxicam (MOBIC) 15 MG tablet   Chest wall pain       Relevant Medications   meloxicam (MOBIC) 15 MG tablet       Return in about 3 months (around  02/27/2017) for re-evaluation of diabetes, blood pressure.   Fara Chute, PA-C [facility] at [facility]

## 2016-11-27 NOTE — Assessment & Plan Note (Signed)
Controlled. Continue current treatment.

## 2016-11-27 NOTE — Patient Instructions (Addendum)
For the itching of your neck: Take loratadine (Claritin) 10 mg each day. If it doesn't help, let me know.  Please schedule an appointment with an eye specialist. My favorites: [facility]  [address], [place], [place] [address]  Phone: [phone]  [facility], [place], [place] [address]  Phone: [phone]  [facility]. [address], [place], [place] [address]  Phone: [phone]      IF you received an x-ray today, you will receive an invoice from [facility]. Please contact [facility] at [phone] with questions or concerns regarding your invoice.   IF you received labwork today, you will receive an invoice from [facility]. Please contact [facility] at [phone] with questions or concerns regarding your invoice.   Our billing staff will not be able to assist you with questions regarding bills from these companies.  You will be contacted with the lab results as soon as they are available. The fastest way to get your results is to activate your My Chart account. Instructions are located on the last page of this paperwork. If you have not heard from [place] regarding the results in 2 weeks, please contact this office.

## 2016-11-27 NOTE — Assessment & Plan Note (Signed)
Reviewed outside records to update problem list. Patient was treated with 12 weeks of Harvoni and viral load remained undetectable at follow-up. Hepatitis C considered CURED. Will resolve this problem.

## 2016-11-27 NOTE — Assessment & Plan Note (Signed)
Await labs. Adjust regimen as indicated by results.

## 2016-11-27 NOTE — Assessment & Plan Note (Signed)
Has been well controlled. Await labs. Adjust regimen as indicated by results.

## 2016-11-27 NOTE — Assessment & Plan Note (Signed)
Await lab results.

## 2016-11-28 LAB — CBC WITH DIFFERENTIAL/PLATELET
Basophils Absolute: 0 10*3/uL (ref 0.0–0.2)
Basos: 0 %
EOS (ABSOLUTE): 0.1 10*3/uL (ref 0.0–0.4)
Eos: 1 %
Hematocrit: 38.7 % (ref 34.0–46.6)
Hemoglobin: 11.9 g/dL (ref 11.1–15.9)
Immature Grans (Abs): 0 10*3/uL (ref 0.0–0.1)
Immature Granulocytes: 0 %
Lymphocytes Absolute: 1.2 10*3/uL (ref 0.7–3.1)
Lymphs: 17 %
MCH: 25.8 pg — ABNORMAL LOW (ref 26.6–33.0)
MCHC: 30.7 g/dL — ABNORMAL LOW (ref 31.5–35.7)
MCV: 84 fL (ref 79–97)
Monocytes Absolute: 0.2 10*3/uL (ref 0.1–0.9)
Monocytes: 2 %
Neutrophils Absolute: 5.7 10*3/uL (ref 1.4–7.0)
Neutrophils: 80 %
Platelets: 366 10*3/uL (ref 150–379)
RBC: 4.61 x10E6/uL (ref 3.77–5.28)
RDW: 15 % (ref 12.3–15.4)
WBC: 7.2 10*3/uL (ref 3.4–10.8)

## 2016-11-28 LAB — COMPREHENSIVE METABOLIC PANEL
ALT: 14 IU/L (ref 0–32)
AST: 17 IU/L (ref 0–40)
Albumin/Globulin Ratio: 1.6 (ref 1.2–2.2)
Albumin: 4.3 g/dL (ref 3.5–5.5)
Alkaline Phosphatase: 40 IU/L (ref 39–117)
BUN/Creatinine Ratio: 15 (ref 9–23)
BUN: 13 mg/dL (ref 6–24)
Bilirubin Total: 0.4 mg/dL (ref 0.0–1.2)
CO2: 25 mmol/L (ref 20–29)
Calcium: 9.8 mg/dL (ref 8.7–10.2)
Chloride: 101 mmol/L (ref 96–106)
Creatinine, Ser: 0.86 mg/dL (ref 0.57–1.00)
GFR calc Af Amer: 96 mL/min/{1.73_m2} (ref >59)
GFR calc non Af Amer: 83 mL/min/{1.73_m2} (ref >59)
Globulin, Total: 2.7 g/dL (ref 1.5–4.5)
Glucose: 101 mg/dL — ABNORMAL HIGH (ref 65–99)
Potassium: 5.3 mmol/L — ABNORMAL HIGH (ref 3.5–5.2)
Sodium: 138 mmol/L (ref 134–144)
Total Protein: 7 g/dL (ref 6.0–8.5)

## 2016-11-28 LAB — LIPID PANEL
Chol/HDL Ratio: 2.8 ratio (ref 0.0–4.4)
Cholesterol, Total: 167 mg/dL (ref 100–199)
HDL: 59 mg/dL (ref >39)
LDL Calculated: 65 mg/dL (ref 0–99)
Triglycerides: 213 mg/dL — ABNORMAL HIGH (ref 0–149)
VLDL Cholesterol Cal: 43 mg/dL — ABNORMAL HIGH (ref 5–40)

## 2016-11-28 LAB — HEMOGLOBIN A1C
Est. average glucose Bld gHb Est-mCnc: 126 mg/dL
Hgb A1c MFr Bld: 6 % — ABNORMAL HIGH (ref 4.8–5.6)

## 2016-12-05 ENCOUNTER — Encounter: Payer: Self-pay | Admitting: Physician Assistant

## 2016-12-30 ENCOUNTER — Other Ambulatory Visit: Payer: Self-pay | Admitting: Physician Assistant

## 2017-01-03 ENCOUNTER — Telehealth: Payer: Self-pay | Admitting: Physician Assistant

## 2017-01-03 NOTE — Telephone Encounter (Signed)
Pt is needing a refill on her plavix  Best number [phone]

## 2017-01-08 DIAGNOSIS — E119 Type 2 diabetes mellitus without complications: Secondary | ICD-10-CM | POA: Diagnosis not present

## 2017-01-17 ENCOUNTER — Other Ambulatory Visit: Payer: Self-pay | Admitting: Physician Assistant

## 2017-01-17 DIAGNOSIS — E119 Type 2 diabetes mellitus without complications: Secondary | ICD-10-CM

## 2017-01-17 DIAGNOSIS — E785 Hyperlipidemia, unspecified: Secondary | ICD-10-CM

## 2017-01-17 DIAGNOSIS — Z794 Long term (current) use of insulin: Principal | ICD-10-CM

## 2017-02-06 ENCOUNTER — Other Ambulatory Visit: Payer: Self-pay | Admitting: Physician Assistant

## 2017-02-26 ENCOUNTER — Encounter: Payer: BLUE CROSS/BLUE SHIELD | Admitting: Physician Assistant

## 2017-03-07 ENCOUNTER — Ambulatory Visit: Payer: BLUE CROSS/BLUE SHIELD | Admitting: Physician Assistant

## 2017-03-07 ENCOUNTER — Encounter: Payer: Self-pay | Admitting: Physician Assistant

## 2017-03-07 ENCOUNTER — Other Ambulatory Visit: Payer: Self-pay

## 2017-03-07 VITALS — BP 130/88 | HR 101 | Temp 98.6°F | Resp 18 | Ht 60.0 in | Wt 160.4 lb

## 2017-03-07 DIAGNOSIS — Z23 Encounter for immunization: Secondary | ICD-10-CM | POA: Diagnosis not present

## 2017-03-07 DIAGNOSIS — R718 Other abnormality of red blood cells: Secondary | ICD-10-CM

## 2017-03-07 DIAGNOSIS — E785 Hyperlipidemia, unspecified: Secondary | ICD-10-CM

## 2017-03-07 DIAGNOSIS — I69998 Other sequelae following unspecified cerebrovascular disease: Secondary | ICD-10-CM | POA: Diagnosis not present

## 2017-03-07 DIAGNOSIS — E119 Type 2 diabetes mellitus without complications: Secondary | ICD-10-CM

## 2017-03-07 DIAGNOSIS — R531 Weakness: Secondary | ICD-10-CM

## 2017-03-07 DIAGNOSIS — I10 Essential (primary) hypertension: Secondary | ICD-10-CM

## 2017-03-07 DIAGNOSIS — Z794 Long term (current) use of insulin: Secondary | ICD-10-CM | POA: Diagnosis not present

## 2017-03-07 DIAGNOSIS — E876 Hypokalemia: Secondary | ICD-10-CM

## 2017-03-07 NOTE — Progress Notes (Signed)
Patient ID: [name], female    DOB: 07-Feb-1976, 41 y.o.   MRN: [id number]  PCP: Harrison Mons, PA-C  Chief Complaint  Patient presents with  . Hypertension  . Diabetes  . Follow-up    3 month follow up    Subjective:   Presents for evaluation of diabetes and HTN.  She feels well. Once every 1-2 months reports pain in the RIGHT lower pelvis. Lasts about 20 minutes and resolves with ibuprofen. Flu vaccine today. Her children are 72 and 8 years old. They live with her brother and his wife, who now have a 61 month old child.  Weakness resulting from CVA is now negligible. Home glucose checks infrequently, maybe once or twice a week. Usually in the 90's fasting.  Current eye exam with Len Blalock, OD at [facility].  Tolerating her medications without difficulty. Does not check home glucose or BP.  Last A1C 11/2016 was 6.0%, LDL was 65   Review of Systems Denies chest pain, shortness of breath, HA, dizziness, vision change, nausea, vomiting, diarrhea, constipation, melena, hematochezia, dysuria, increased urinary urgency or frequency, increased hunger or thirst, unintentional weight change, unexplained myalgias or arthralgias, rash.     Patient Active Problem List   Diagnosis Date Noted  . RBC microcytosis 01/25/2016  . BMI 33.0-33.9,adult 10/18/2015  . Hyperlipidemia 03/03/2013  . HCV antibody positive 03/03/2013  . CVA (cerebral infarction) 02/04/2013  . Hypokalemia 01/31/2013  . Right sided weakness 01/30/2013  . HTN (hypertension) 01/30/2013  . DM (diabetes mellitus) ([place]) 01/30/2013     Prior to Admission medications   Medication Sig Start Date End Date Taking? Authorizing Provider  atorvastatin (LIPITOR) 80 MG tablet TAKE 1 TABLET(80 MG) BY MOUTH DAILY AT 6 PM 11/27/16  Yes Dhairya Corales, PA-C  atorvastatin (LIPITOR) 80 MG tablet TAKE 1 TABLET(80 MG) BY MOUTH DAILY AT 6 PM 01/17/17  Yes Halbert Jesson, PA-C  B-D UF III MINI PEN NEEDLES 31G X 5  MM MISC USE DAILY AS DIRECTED TO INJECT INSULIN 12/31/16  Yes Kaydance Bowie, PA-C  clopidogrel (PLAVIX) 75 MG tablet Take 1 tablet (75 mg total) by mouth daily. 11/27/16  Yes Keymari Sato, PA-C  ezetimibe (ZETIA) 10 MG tablet Take 1 tablet (10 mg total) by mouth daily. 11/27/16  Yes Noah Pelaez, PA-C  gabapentin (NEURONTIN) 100 MG capsule Take 3 capsules (300 mg total) by mouth 3 (three) times daily. 11/27/16  Yes Midori Dado, PA-C  Insulin Glargine (LANTUS SOLOSTAR) 100 UNIT/ML Solostar Pen ADMINISTER 25 UNITS UNDER THE SKIN DAILY AT 10:00PM 11/27/16  Yes Edmund Holcomb, PA-C  lisinopril (PRINIVIL,ZESTRIL) 10 MG tablet TAKE 1 TABLET(10 MG) BY MOUTH DAILY 10/08/16  Yes Khandi Kernes, PA-C  meloxicam (MOBIC) 15 MG tablet TAKE 1 TABLET(15 MG) BY MOUTH DAILY 11/27/16  Yes Kayleen Alig, PA-C  metFORMIN (GLUCOPHAGE) 500 MG tablet TAKE 1 TABLET(500 MG) BY MOUTH DAILY WITH BREAKFAST 02/06/17  Yes Aline Wesche, PA-C  omega-3 acid ethyl esters (LOVAZA) 1 g capsule Take 2 capsules (2 g total) by mouth 2 (two) times daily. 11/27/16  Yes Karimah Winquist, PA-C     No Known Allergies     Objective:  Physical Exam  Constitutional: She is oriented to person, place, and time. She appears well-developed and well-nourished. She is active and cooperative. No distress.  BP 130/88 (BP Location: Left Arm, Patient Position: Sitting, Cuff Size: Normal)   Pulse (!) 101   Temp 98.6 F (37 C) (Oral)   Resp 18  Ht 5' (1.524 m)   Wt 160 lb 6.4 oz (72.8 kg)   LMP 03/04/2017   SpO2 98%   BMI 31.33 kg/m   HENT:  Head: Normocephalic and atraumatic.  Right Ear: Hearing normal.  Left Ear: Hearing normal.  Eyes: Conjunctivae are normal. No scleral icterus.  Neck: Normal range of motion. Neck supple. No thyromegaly present.  Cardiovascular: Normal rate, regular rhythm and normal heart sounds.  Pulses:      Radial pulses are 2+ on the right side, and 2+ on the left side.  Pulmonary/Chest: Effort normal  and breath sounds normal.  Lymphadenopathy:       Head (right side): No tonsillar, no preauricular, no posterior auricular and no occipital adenopathy present.       Head (left side): No tonsillar, no preauricular, no posterior auricular and no occipital adenopathy present.    She has no cervical adenopathy.       Right: No supraclavicular adenopathy present.       Left: No supraclavicular adenopathy present.  Neurological: She is alert and oriented to person, place, and time. No sensory deficit.  Skin: Skin is warm, dry and intact. No rash noted. No cyanosis or erythema. Nails show no clubbing.  Psychiatric: She has a normal mood and affect. Her speech is normal and behavior is normal.       Wt Readings from Last 3 Encounters:  03/07/17 160 lb 6.4 oz (72.8 kg)  11/27/16 159 lb 3.2 oz (72.2 kg)  08/07/16 168 lb (76.2 kg)       Assessment & Plan:   Problem List Items Addressed This Visit    Weakness as late effect of cerebrovascular accident (CVA)    Near complete resolution.       HTN (hypertension)    Controlled.      DM (diabetes mellitus) ([place]) - Primary    Has been controlled. Await lab results.      Relevant Orders   HM DIABETES EYE EXAM (Completed)   Hypokalemia    Elevated last visit. Update today.      Hyperlipidemia    Has been well controlled. Await lab results.      RBC microcytosis    Has been stable. Suspect this is patient's baseline and not representative of an underlying problem.       Other Visit Diagnoses    Flu vaccine need       Relevant Orders   Flu Vaccine QUAD 36+ mos IM (Completed)       Return in about 3 months (around 06/06/2017) for re-evalaution of diabetes.   Fara Chute, PA-C [facility] at [place]

## 2017-03-07 NOTE — Patient Instructions (Signed)
     IF you received an x-ray today, you will receive an invoice from [facility]. Please contact [facility] at [phone] with questions or concerns regarding your invoice.   IF you received labwork today, you will receive an invoice from [facility]. Please contact [facility] at [phone] with questions or concerns regarding your invoice.   Our billing staff will not be able to assist you with questions regarding bills from these companies.  You will be contacted with the lab results as soon as they are available. The fastest way to get your results is to activate your My Chart account. Instructions are located on the last page of this paperwork. If you have not heard from us regarding the results in 2 weeks, please contact this office.     

## 2017-03-08 ENCOUNTER — Other Ambulatory Visit: Payer: Self-pay | Admitting: Physician Assistant

## 2017-03-08 LAB — CBC WITH DIFFERENTIAL/PLATELET
Basophils Absolute: 0 10*3/uL (ref 0.0–0.2)
Basos: 0 %
EOS (ABSOLUTE): 0.1 10*3/uL (ref 0.0–0.4)
Eos: 1 %
Hematocrit: 42.8 % (ref 34.0–46.6)
Hemoglobin: 13.5 g/dL (ref 11.1–15.9)
Immature Grans (Abs): 0 10*3/uL (ref 0.0–0.1)
Immature Granulocytes: 0 %
Lymphocytes Absolute: 1.3 10*3/uL (ref 0.7–3.1)
Lymphs: 15 %
MCH: 25.7 pg — ABNORMAL LOW (ref 26.6–33.0)
MCHC: 31.5 g/dL (ref 31.5–35.7)
MCV: 82 fL (ref 79–97)
Monocytes Absolute: 0.8 10*3/uL (ref 0.1–0.9)
Monocytes: 9 %
Neutrophils Absolute: 6.4 10*3/uL (ref 1.4–7.0)
Neutrophils: 75 %
Platelets: 419 10*3/uL — ABNORMAL HIGH (ref 150–379)
RBC: 5.25 x10E6/uL (ref 3.77–5.28)
RDW: 15.6 % — ABNORMAL HIGH (ref 12.3–15.4)
WBC: 8.6 10*3/uL (ref 3.4–10.8)

## 2017-03-08 LAB — COMPREHENSIVE METABOLIC PANEL
ALT: 12 IU/L (ref 0–32)
AST: 15 IU/L (ref 0–40)
Albumin/Globulin Ratio: 1.4 (ref 1.2–2.2)
Albumin: 4.3 g/dL (ref 3.5–5.5)
Alkaline Phosphatase: 53 IU/L (ref 39–117)
BUN/Creatinine Ratio: 17 (ref 9–23)
BUN: 13 mg/dL (ref 6–24)
Bilirubin Total: 0.3 mg/dL (ref 0.0–1.2)
CO2: 23 mmol/L (ref 20–29)
Calcium: 10.1 mg/dL (ref 8.7–10.2)
Chloride: 103 mmol/L (ref 96–106)
Creatinine, Ser: 0.77 mg/dL (ref 0.57–1.00)
GFR calc Af Amer: 109 mL/min/{1.73_m2} (ref >59)
GFR calc non Af Amer: 95 mL/min/{1.73_m2} (ref >59)
Globulin, Total: 3 g/dL (ref 1.5–4.5)
Glucose: 92 mg/dL (ref 65–99)
Potassium: 4.7 mmol/L (ref 3.5–5.2)
Sodium: 142 mmol/L (ref 134–144)
Total Protein: 7.3 g/dL (ref 6.0–8.5)

## 2017-03-08 LAB — LIPID PANEL
Chol/HDL Ratio: 3.3 ratio (ref 0.0–4.4)
Cholesterol, Total: 192 mg/dL (ref 100–199)
HDL: 58 mg/dL (ref >39)
LDL Calculated: 78 mg/dL (ref 0–99)
Triglycerides: 279 mg/dL — ABNORMAL HIGH (ref 0–149)
VLDL Cholesterol Cal: 56 mg/dL — ABNORMAL HIGH (ref 5–40)

## 2017-03-08 LAB — HEMOGLOBIN A1C
Est. average glucose Bld gHb Est-mCnc: 120 mg/dL
Hgb A1c MFr Bld: 5.8 % — ABNORMAL HIGH (ref 4.8–5.6)

## 2017-03-10 NOTE — Assessment & Plan Note (Signed)
Has been stable. Suspect this is patient's baseline and not representative of an underlying problem.

## 2017-03-10 NOTE — Assessment & Plan Note (Signed)
Controlled.  

## 2017-03-10 NOTE — Progress Notes (Signed)
This encounter was created in error - please disregard.

## 2017-03-10 NOTE — Assessment & Plan Note (Signed)
Has been controlled. Await lab results.

## 2017-03-10 NOTE — Assessment & Plan Note (Signed)
Near complete resolution.

## 2017-03-10 NOTE — Assessment & Plan Note (Signed)
Has been well controlled. Await lab results.

## 2017-03-10 NOTE — Progress Notes (Signed)
Results for orders placed or performed in visit on 03/07/17  Lipid panel  Result Value Ref Range   Cholesterol, Total 192 100 - 199 mg/dL   Triglycerides 279 (H) 0 - 149 mg/dL   HDL 58 >39 mg/dL   VLDL Cholesterol Cal 56 (H) 5 - 40 mg/dL   LDL Calculated 78 0 - 99 mg/dL   Chol/HDL Ratio 3.3 0.0 - 4.4 ratio  Comprehensive metabolic panel  Result Value Ref Range   Glucose 92 65 - 99 mg/dL   BUN 13 6 - 24 mg/dL   Creatinine, Ser 0.77 0.57 - 1.00 mg/dL   GFR calc non Af Amer 95 >59 mL/min/1.73   GFR calc Af Amer 109 >59 mL/min/1.73   BUN/Creatinine Ratio 17 9 - 23   Sodium 142 134 - 144 mmol/L   Potassium 4.7 3.5 - 5.2 mmol/L   Chloride 103 96 - 106 mmol/L   CO2 23 20 - 29 mmol/L   Calcium 10.1 8.7 - 10.2 mg/dL   Total Protein 7.3 6.0 - 8.5 g/dL   Albumin 4.3 3.5 - 5.5 g/dL   Globulin, Total 3.0 1.5 - 4.5 g/dL   Albumin/Globulin Ratio 1.4 1.2 - 2.2   Bilirubin Total 0.3 0.0 - 1.2 mg/dL   Alkaline Phosphatase 53 39 - 117 IU/L   AST 15 0 - 40 IU/L   ALT 12 0 - 32 IU/L  Hemoglobin A1c  Result Value Ref Range   Hgb A1c MFr Bld 5.8 (H) 4.8 - 5.6 %   Est. average glucose Bld gHb Est-mCnc 120 mg/dL  CBC with Differential/Platelet  Result Value Ref Range   WBC 8.6 3.4 - 10.8 x10E3/uL   RBC 5.25 3.77 - 5.28 x10E6/uL   Hemoglobin 13.5 11.1 - 15.9 g/dL   Hematocrit 42.8 34.0 - 46.6 %   MCV 82 79 - 97 fL   MCH 25.7 (L) 26.6 - 33.0 pg   MCHC 31.5 31.5 - 35.7 g/dL   RDW 15.6 (H) 12.3 - 15.4 %   Platelets 419 (H) 150 - 379 x10E3/uL   Neutrophils 75 Not Estab. %   Lymphs 15 Not Estab. %   Monocytes 9 Not Estab. %   Eos 1 Not Estab. %   Basos 0 Not Estab. %   Neutrophils Absolute 6.4 1.4 - 7.0 x10E3/uL   Lymphocytes Absolute 1.3 0.7 - 3.1 x10E3/uL   Monocytes Absolute 0.8 0.1 - 0.9 x10E3/uL   EOS (ABSOLUTE) 0.1 0.0 - 0.4 x10E3/uL   Basophils Absolute 0.0 0.0 - 0.2 x10E3/uL   Immature Granulocytes 0 Not Estab. %   Immature Grans (Abs) 0.0 0.0 - 0.1 x10E3/uL

## 2017-03-10 NOTE — Assessment & Plan Note (Signed)
Elevated last visit. Update today.

## 2017-04-18 ENCOUNTER — Other Ambulatory Visit: Payer: Self-pay | Admitting: Physician Assistant

## 2017-06-07 ENCOUNTER — Ambulatory Visit: Payer: BLUE CROSS/BLUE SHIELD | Admitting: Physician Assistant

## 2017-06-26 ENCOUNTER — Ambulatory Visit (INDEPENDENT_AMBULATORY_CARE_PROVIDER_SITE_OTHER): Payer: BLUE CROSS/BLUE SHIELD

## 2017-06-26 ENCOUNTER — Other Ambulatory Visit: Payer: Self-pay

## 2017-06-26 ENCOUNTER — Ambulatory Visit: Payer: BLUE CROSS/BLUE SHIELD | Admitting: Physician Assistant

## 2017-06-26 ENCOUNTER — Encounter: Payer: Self-pay | Admitting: Physician Assistant

## 2017-06-26 VITALS — BP 112/74 | HR 97 | Temp 98.3°F | Resp 16 | Ht 60.0 in | Wt 162.4 lb

## 2017-06-26 DIAGNOSIS — E785 Hyperlipidemia, unspecified: Secondary | ICD-10-CM

## 2017-06-26 DIAGNOSIS — M533 Sacrococcygeal disorders, not elsewhere classified: Secondary | ICD-10-CM

## 2017-06-26 DIAGNOSIS — E119 Type 2 diabetes mellitus without complications: Secondary | ICD-10-CM | POA: Diagnosis not present

## 2017-06-26 DIAGNOSIS — Z794 Long term (current) use of insulin: Secondary | ICD-10-CM

## 2017-06-26 DIAGNOSIS — Z6833 Body mass index (BMI) 33.0-33.9, adult: Secondary | ICD-10-CM

## 2017-06-26 DIAGNOSIS — G8929 Other chronic pain: Secondary | ICD-10-CM

## 2017-06-26 DIAGNOSIS — G8110 Spastic hemiplegia affecting unspecified side: Secondary | ICD-10-CM | POA: Insufficient documentation

## 2017-06-26 DIAGNOSIS — M4186 Other forms of scoliosis, lumbar region: Secondary | ICD-10-CM | POA: Diagnosis not present

## 2017-06-26 DIAGNOSIS — Z124 Encounter for screening for malignant neoplasm of cervix: Secondary | ICD-10-CM | POA: Diagnosis not present

## 2017-06-26 DIAGNOSIS — I10 Essential (primary) hypertension: Secondary | ICD-10-CM

## 2017-06-26 LAB — POCT URINALYSIS DIP (MANUAL ENTRY)
Bilirubin, UA: NEGATIVE
Blood, UA: NEGATIVE
Glucose, UA: NEGATIVE mg/dL
Ketones, POC UA: NEGATIVE mg/dL
Nitrite, UA: NEGATIVE
Protein Ur, POC: NEGATIVE mg/dL
Spec Grav, UA: 1.02 (ref 1.010–1.025)
Urobilinogen, UA: 0.2 E.U./dL
pH, UA: 6.5 (ref 5.0–8.0)

## 2017-06-26 LAB — POCT GLYCOSYLATED HEMOGLOBIN (HGB A1C): Hemoglobin A1C: 5.6

## 2017-06-26 MED ORDER — METFORMIN HCL ER 500 MG PO TB24: 1000.0000 mg | ORAL_TABLET | Freq: Every day | ORAL | 3 refills | Status: AC

## 2017-06-26 MED ORDER — GABAPENTIN 300 MG PO CAPS: 300.0000 mg | ORAL_CAPSULE | Freq: Three times a day (TID) | ORAL | 3 refills | Status: DC

## 2017-06-26 NOTE — Assessment & Plan Note (Signed)
Reportedly continues atorvastatin 80 mg, though it is not with her medications today. Continue current treatment, including Zetia and Lovaza. Goal LDL <70.

## 2017-06-26 NOTE — Assessment & Plan Note (Signed)
Well controlled. Appears to have been taking metformin 1000 mg daily, rather than 500 mg. CHANGE to ER formulation, continue 100 mg. If A1C remains this well controlled, plan to reduce, D/C Lantus at next visit. Counseled on eating choices and increasing exercise.

## 2017-06-26 NOTE — Progress Notes (Signed)
Patient ID: [name], female    DOB: 07-14-1975, 42 y.o.   MRN: [id number]  PCP: Harrison Mons, PA-C  Chief Complaint  Patient presents with  . Diabetes  . Hypertension    Subjective:   Presents for evaluation of diabetes, HTN and hyperlipidemia. A female friend is present to translate.  Reports chronic low back pain, for which she is taking 2 OTC tablets every 3-4 hours. This is in addition to meloxicam. She also continues to take the clopidogrel to reduce risk of recurrent CVA. As we review her medication list, there are several discrepancies with what she brought with her. She is taking gabapentin, but takes 200 mg once each day. She did not bring atorvastatin, but reports that she is taking it. She has two bottles of metformin, and has been taking one tablet from each bottle every day.  No CP, SOB, HA, dizziness, nausea, vomiting, diarrhea.  LMP 06/08/2017. Not sexually active.  Health Maintenance Due  Topic Date Due  . PAP SMEAR  06/13/2017  declines pap today.   She is not fasting today. She ate some noodles this morning.    Review of Systems  Constitutional: Negative for activity change, appetite change, fatigue and unexpected weight change.  HENT: Negative for congestion, dental problem, ear pain, hearing loss, mouth sores, postnasal drip, rhinorrhea, sneezing, sore throat, tinnitus and trouble swallowing.   Eyes: Negative for photophobia, pain, redness and visual disturbance.  Respiratory: Negative for cough, chest tightness and shortness of breath.   Cardiovascular: Negative for chest pain, palpitations and leg swelling.  Gastrointestinal: Negative for abdominal pain, blood in stool, constipation, diarrhea, nausea and vomiting.  Endocrine: Negative for cold intolerance, heat intolerance, polydipsia, polyphagia and polyuria.  Genitourinary: Negative for dysuria, frequency, hematuria and urgency.  Musculoskeletal: Positive for back pain. Negative for  arthralgias, gait problem, myalgias and neck stiffness.  Skin: Negative for rash.  Neurological: Negative for dizziness, speech difficulty, weakness, light-headedness, numbness and headaches.  Hematological: Negative for adenopathy.  Psychiatric/Behavioral: Negative for confusion and sleep disturbance. The patient is not nervous/anxious.     Patient Active Problem List   Diagnosis Date Noted  . RBC microcytosis 01/25/2016  . BMI 33.0-33.9,adult 10/18/2015  . Hyperlipidemia 03/03/2013  . HCV antibody positive 03/03/2013  . History of cerebral infarction 02/04/2013  . Hypokalemia 01/31/2013  . Weakness as late effect of cerebrovascular accident (CVA) 01/30/2013  . HTN (hypertension) 01/30/2013  . DM (diabetes mellitus) ([place]) 01/30/2013     Prior to Admission medications   Medication Sig Start Date End Date Taking? Authorizing Provider  atorvastatin (LIPITOR) 80 MG tablet TAKE 1 TABLET(80 MG) BY MOUTH DAILY AT 6 PM 01/17/17  Yes Christia Coaxum, PA-C  B-D UF III MINI PEN NEEDLES 31G X 5 MM MISC USE DAILY AS DIRECTED TO INJECT INSULIN 04/18/17  Yes Latriece Anstine, PA-C  clopidogrel (PLAVIX) 75 MG tablet Take 1 tablet (75 mg total) by mouth daily. 11/27/16  Yes Marchia Diguglielmo, PA-C  ezetimibe (ZETIA) 10 MG tablet Take 1 tablet (10 mg total) by mouth daily. 11/27/16  Yes Chastelyn Athens, PA-C  Insulin Glargine (LANTUS SOLOSTAR) 100 UNIT/ML Solostar Pen ADMINISTER 25 UNITS UNDER THE SKIN DAILY AT 10:00PM 11/27/16  Yes Ellena Kamen, PA-C  lisinopril (PRINIVIL,ZESTRIL) 10 MG tablet TAKE 1 TABLET(10 MG) BY MOUTH DAILY 10/08/16  Yes Juron Vorhees, PA-C  meloxicam (MOBIC) 15 MG tablet TAKE 1 TABLET(15 MG) BY MOUTH DAILY 11/27/16  Yes Leroy Trim, PA-C  metFORMIN (GLUCOPHAGE) 500 MG tablet  TAKE 1 TABLET(500 MG) BY MOUTH DAILY WITH BREAKFAST 03/08/17  Yes Tri Chittick, PA-C  omega-3 acid ethyl esters (LOVAZA) 1 g capsule Take 2 capsules (2 g total) by mouth 2 (two) times daily. 11/27/16  Yes  Carina Chaplin, PA-C  atorvastatin (LIPITOR) 80 MG tablet TAKE 1 TABLET(80 MG) BY MOUTH DAILY AT 6 PM Patient not taking: Reported on 06/26/2017 11/27/16  Yes Helmuth Recupero, PA-C  gabapentin (NEURONTIN) 100 MG capsule Take 3 capsules (300 mg total) by mouth 3 (three) times daily. Patient not taking: Reported on 06/26/2017 11/27/16  Yes Layken Doenges, PA-C     No Known Allergies     Objective:  Physical Exam  Constitutional: She is oriented to person, place, and time. She appears well-developed and well-nourished. No distress.  BP 112/74   Pulse 97   Temp 98.3 F (36.8 C)   Resp 16   Ht 5' (1.524 m)   Wt 162 lb 6.4 oz (73.7 kg)   SpO2 98%   BMI 31.72 kg/m    Eyes: Conjunctivae are normal. No scleral icterus.  Neck: No thyromegaly present.  Cardiovascular: Normal rate, regular rhythm, normal heart sounds and intact distal pulses.  Pulmonary/Chest: Effort normal and breath sounds normal.  Musculoskeletal:       Lumbar back: She exhibits tenderness and pain. She exhibits normal range of motion, no bony tenderness, no swelling, no edema, no deformity, no laceration, no spasm and normal pulse.       Back:  Lymphadenopathy:    She has no cervical adenopathy.  Neurological: She is alert and oriented to person, place, and time. She has normal strength. No cranial nerve deficit or sensory deficit.  Reflex Scores:      Bicep reflexes are 2+ on the right side and 2+ on the left side.      Patellar reflexes are 2+ on the right side and 2+ on the left side.      Achilles reflexes are 2+ on the right side and 2+ on the left side. Negative SLR.  Skin: Skin is warm and dry.  Psychiatric: She has a normal mood and affect. Her speech is normal and behavior is normal.   Wt Readings from Last 3 Encounters:  06/26/17 162 lb 6.4 oz (73.7 kg)  03/07/17 160 lb 6.4 oz (72.8 kg)  11/27/16 159 lb 3.2 oz (72.2 kg)   Dg Lumbar Spine Complete  Result Date: 06/26/2017 CLINICAL DATA:  Right SI  pain and tenderness. EXAM: LUMBAR SPINE - COMPLETE 4+ VIEW COMPARISON:  None. FINDINGS: There 5 non rib-bearing lumbar type vertebrae. Mild lumbar levoscoliosis is noted. There is straightening of the normal lumbar lordosis. No listhesis is seen. Intervertebral disc space heights are preserved. No pars defects are seen. No significant disc degeneration or facet arthropathy is evident. The SI joints are grossly unremarkable. IMPRESSION: Mild lumbar levoscoliosis.  No significant degenerative changes. Electronically Signed   By: Logan Bores M.D.   On: 06/26/2017 11:28   Results for orders placed or performed in visit on 06/26/17  POCT glycosylated hemoglobin (Hb A1C)  Result Value Ref Range   Hemoglobin A1C 5.6   POCT urinalysis dipstick  Result Value Ref Range   Color, UA yellow yellow   Clarity, UA clear clear   Glucose, UA negative negative mg/dL   Bilirubin, UA negative negative   Ketones, POC UA negative negative mg/dL   Spec Grav, UA 1.020 1.010 - 1.025   Blood, UA negative negative   pH, UA 6.5  5.0 - 8.0   Protein Ur, POC negative negative mg/dL   Urobilinogen, UA 0.2 0.2 or 1.0 E.U./dL   Nitrite, UA Negative Negative   Leukocytes, UA Small (1+) (A) Negative      Assessment & Plan:   Problem List Items Addressed This Visit    HTN (hypertension)    Well controlled. Continue lisinopril 10 mg QD.      Relevant Orders   Comprehensive metabolic panel   DM (diabetes mellitus) ([place]) - Primary    Well controlled. Appears to have been taking metformin 1000 mg daily, rather than 500 mg. CHANGE to ER formulation, continue 100 mg. If A1C remains this well controlled, plan to reduce, D/C Lantus at next visit. Counseled on eating choices and increasing exercise.      Relevant Medications   metFORMIN (GLUCOPHAGE-XR) 500 MG 24 hr tablet   Other Relevant Orders   Comprehensive metabolic panel   POCT glycosylated hemoglobin (Hb A1C) (Completed)   POCT urinalysis dipstick (Completed)    Hyperlipidemia    Reportedly continues atorvastatin 80 mg, though it is not with her medications today. Continue current treatment, including Zetia and Lovaza. Goal LDL <70.      Relevant Orders   Comprehensive metabolic panel   Lipid panel   BMI 33.0-33.9,adult    Increase exercise frequency, duration and intensity.      Spastic hemiplegia affecting dominant side (HCC)    Change gabapentin to 300 mg capsule. INCREASE to the TID dose recommended. STOP NSAIDS. Use acetaminophen PRN.      Relevant Medications   gabapentin (NEURONTIN) 300 MG capsule    Other Visit Diagnoses    Screening for cervical cancer       declines today. Schedule wellness visit.   Chronic right SI joint pain       Change gabapentin to 300 mg capsule. INCREASE to the TID dose recommended. STOP NSAIDS. Use acetaminophen PRN.    Relevant Medications   gabapentin (NEURONTIN) 300 MG capsule   Other Relevant Orders   DG Lumbar Spine Complete (Completed)       Return in about 3 months (around 09/26/2017) for re-evaluation of diabetes, cholesterol, etc.   Fara Chute, PA-C [facility]

## 2017-06-26 NOTE — Assessment & Plan Note (Signed)
Well controlled. Continue lisinopril 10 mg QD.

## 2017-06-26 NOTE — Patient Instructions (Addendum)
1. STOP Advil (ibuprofen, naproxen). 2. STOP meloxicam. 3. START acetaminophen (Tylenol) as needed for pain.  -Please minimize the starches that you eat (noodles, rice, bread, grains (like oatmeal!)) and increase the vegetables and lean protein. Be careful with fruit. Fruits that have a lot of fiber are better than those with low fiber. Barnabas Lister Fruit and Guava are examples of high fiber fruits. -Please get plenty of exercise. We recommend 150 minutes/week.   IF you received an x-ray today, you will receive an invoice from [facility]. Please contact [facility] at [phone] with questions or concerns regarding your invoice.   IF you received labwork today, you will receive an invoice from [facility]. Please contact [facility] at [phone] with questions or concerns regarding your invoice.   Our billing staff will not be able to assist you with questions regarding bills from these companies.  You will be contacted with the lab results as soon as they are available. The fastest way to get your results is to activate your My Chart account. Instructions are located on the last page of this paperwork. If you have not heard from [place] regarding the results in 2 weeks, please contact this office.

## 2017-06-26 NOTE — Assessment & Plan Note (Signed)
Increase exercise frequency, duration and intensity.

## 2017-06-26 NOTE — Assessment & Plan Note (Signed)
Change gabapentin to 300 mg capsule. INCREASE to the TID dose recommended. STOP NSAIDS. Use acetaminophen PRN.

## 2017-06-27 ENCOUNTER — Encounter: Payer: Self-pay | Admitting: Physician Assistant

## 2017-06-27 LAB — COMPREHENSIVE METABOLIC PANEL
ALT: 13 IU/L (ref 0–32)
AST: 14 IU/L (ref 0–40)
Albumin/Globulin Ratio: 1.4 (ref 1.2–2.2)
Albumin: 4.3 g/dL (ref 3.5–5.5)
Alkaline Phosphatase: 41 IU/L (ref 39–117)
BUN/Creatinine Ratio: 24 — ABNORMAL HIGH (ref 9–23)
BUN: 19 mg/dL (ref 6–24)
Bilirubin Total: 0.5 mg/dL (ref 0.0–1.2)
CO2: 22 mmol/L (ref 20–29)
Calcium: 9.7 mg/dL (ref 8.7–10.2)
Chloride: 106 mmol/L (ref 96–106)
Creatinine, Ser: 0.78 mg/dL (ref 0.57–1.00)
GFR calc Af Amer: 108 mL/min/{1.73_m2} (ref >59)
GFR calc non Af Amer: 93 mL/min/{1.73_m2} (ref >59)
Globulin, Total: 3.1 g/dL (ref 1.5–4.5)
Glucose: 142 mg/dL — ABNORMAL HIGH (ref 65–99)
Potassium: 4.9 mmol/L (ref 3.5–5.2)
Sodium: 142 mmol/L (ref 134–144)
Total Protein: 7.4 g/dL (ref 6.0–8.5)

## 2017-06-27 LAB — URINALYSIS, DIPSTICK ONLY
Bilirubin, UA: NEGATIVE
Glucose, UA: NEGATIVE
Ketones, UA: NEGATIVE
Nitrite, UA: NEGATIVE
Protein, UA: NEGATIVE
RBC, UA: NEGATIVE
Specific Gravity, UA: 1.022 (ref 1.005–1.030)
Urobilinogen, Ur: 0.2 mg/dL (ref 0.2–1.0)
pH, UA: 6.5 (ref 5.0–7.5)

## 2017-06-27 LAB — LIPID PANEL
Chol/HDL Ratio: 3.4 ratio (ref 0.0–4.4)
Cholesterol, Total: 181 mg/dL (ref 100–199)
HDL: 54 mg/dL (ref >39)
LDL Calculated: 98 mg/dL (ref 0–99)
Triglycerides: 147 mg/dL (ref 0–149)
VLDL Cholesterol Cal: 29 mg/dL (ref 5–40)

## 2017-07-11 ENCOUNTER — Encounter: Payer: Self-pay | Admitting: Physician Assistant

## 2017-08-04 ENCOUNTER — Other Ambulatory Visit: Payer: Self-pay | Admitting: Physician Assistant

## 2017-10-01 ENCOUNTER — Ambulatory Visit: Payer: BLUE CROSS/BLUE SHIELD | Admitting: Physician Assistant

## 2017-10-15 DIAGNOSIS — E119 Type 2 diabetes mellitus without complications: Secondary | ICD-10-CM | POA: Diagnosis not present

## 2017-10-15 DIAGNOSIS — E785 Hyperlipidemia, unspecified: Secondary | ICD-10-CM | POA: Diagnosis not present

## 2017-10-15 DIAGNOSIS — I10 Essential (primary) hypertension: Secondary | ICD-10-CM | POA: Diagnosis not present

## 2018-01-20 DIAGNOSIS — E785 Hyperlipidemia, unspecified: Secondary | ICD-10-CM | POA: Diagnosis not present

## 2018-01-20 DIAGNOSIS — Z6833 Body mass index (BMI) 33.0-33.9, adult: Secondary | ICD-10-CM | POA: Diagnosis not present

## 2018-01-20 DIAGNOSIS — Z23 Encounter for immunization: Secondary | ICD-10-CM | POA: Diagnosis not present

## 2018-01-20 DIAGNOSIS — E119 Type 2 diabetes mellitus without complications: Secondary | ICD-10-CM | POA: Diagnosis not present

## 2018-01-20 DIAGNOSIS — I10 Essential (primary) hypertension: Secondary | ICD-10-CM | POA: Diagnosis not present

## 2018-04-21 DIAGNOSIS — E1159 Type 2 diabetes mellitus with other circulatory complications: Secondary | ICD-10-CM | POA: Diagnosis not present

## 2018-04-21 DIAGNOSIS — Z1231 Encounter for screening mammogram for malignant neoplasm of breast: Secondary | ICD-10-CM | POA: Diagnosis not present

## 2018-04-21 DIAGNOSIS — I10 Essential (primary) hypertension: Secondary | ICD-10-CM | POA: Diagnosis not present

## 2018-04-21 DIAGNOSIS — G8110 Spastic hemiplegia affecting unspecified side: Secondary | ICD-10-CM | POA: Diagnosis not present

## 2018-04-21 DIAGNOSIS — E785 Hyperlipidemia, unspecified: Secondary | ICD-10-CM | POA: Diagnosis not present

## 2018-04-21 DIAGNOSIS — E1169 Type 2 diabetes mellitus with other specified complication: Secondary | ICD-10-CM | POA: Diagnosis not present

## 2018-04-30 ENCOUNTER — Other Ambulatory Visit: Payer: Self-pay | Admitting: Physician Assistant

## 2018-04-30 DIAGNOSIS — Z1231 Encounter for screening mammogram for malignant neoplasm of breast: Secondary | ICD-10-CM

## 2018-07-21 DIAGNOSIS — E1169 Type 2 diabetes mellitus with other specified complication: Secondary | ICD-10-CM | POA: Diagnosis not present

## 2018-07-21 DIAGNOSIS — E1159 Type 2 diabetes mellitus with other circulatory complications: Secondary | ICD-10-CM | POA: Diagnosis not present

## 2018-07-21 DIAGNOSIS — E559 Vitamin D deficiency, unspecified: Secondary | ICD-10-CM | POA: Diagnosis not present

## 2018-07-21 DIAGNOSIS — I10 Essential (primary) hypertension: Secondary | ICD-10-CM | POA: Diagnosis not present

## 2018-12-14 ENCOUNTER — Other Ambulatory Visit: Payer: Self-pay

## 2018-12-14 ENCOUNTER — Encounter (HOSPITAL_COMMUNITY): Payer: Self-pay

## 2018-12-14 ENCOUNTER — Emergency Department (HOSPITAL_COMMUNITY): Payer: BC Managed Care – PPO

## 2018-12-14 ENCOUNTER — Emergency Department (HOSPITAL_COMMUNITY)
Admission: EM | Admit: 2018-12-14 | Discharge: 2018-12-14 | Disposition: A | Discharge status: Home / Self Care | Payer: BC Managed Care – PPO | Attending: Emergency Medicine | Admitting: Emergency Medicine

## 2018-12-14 DIAGNOSIS — R102 Pelvic and perineal pain: Secondary | ICD-10-CM | POA: Diagnosis not present

## 2018-12-14 DIAGNOSIS — Z794 Long term (current) use of insulin: Secondary | ICD-10-CM | POA: Insufficient documentation

## 2018-12-14 DIAGNOSIS — Z7901 Long term (current) use of anticoagulants: Secondary | ICD-10-CM | POA: Diagnosis not present

## 2018-12-14 DIAGNOSIS — E119 Type 2 diabetes mellitus without complications: Secondary | ICD-10-CM | POA: Diagnosis not present

## 2018-12-14 DIAGNOSIS — I10 Essential (primary) hypertension: Secondary | ICD-10-CM | POA: Diagnosis not present

## 2018-12-14 DIAGNOSIS — Z79899 Other long term (current) drug therapy: Secondary | ICD-10-CM | POA: Insufficient documentation

## 2018-12-14 DIAGNOSIS — R110 Nausea: Secondary | ICD-10-CM | POA: Diagnosis not present

## 2018-12-14 DIAGNOSIS — R1032 Left lower quadrant pain: Secondary | ICD-10-CM

## 2018-12-14 DIAGNOSIS — D251 Intramural leiomyoma of uterus: Secondary | ICD-10-CM | POA: Diagnosis not present

## 2018-12-14 DIAGNOSIS — D259 Leiomyoma of uterus, unspecified: Secondary | ICD-10-CM | POA: Diagnosis not present

## 2018-12-14 DIAGNOSIS — D250 Submucous leiomyoma of uterus: Secondary | ICD-10-CM | POA: Diagnosis not present

## 2018-12-14 LAB — COMPREHENSIVE METABOLIC PANEL
ALT: 18 U/L (ref 0–44)
AST: 16 U/L (ref 15–41)
Albumin: 3.7 g/dL (ref 3.5–5.0)
Alkaline Phosphatase: 48 U/L (ref 38–126)
Anion gap: 8 (ref 5–15)
BUN: 12 mg/dL (ref 6–20)
CO2: 22 mmol/L (ref 22–32)
Calcium: 9.2 mg/dL (ref 8.9–10.3)
Chloride: 107 mmol/L (ref 98–111)
Creatinine, Ser: 0.68 mg/dL (ref 0.44–1.00)
GFR calc Af Amer: >60 mL/min (ref >60)
GFR calc non Af Amer: >60 mL/min (ref >60)
Glucose, Bld: 174 mg/dL — ABNORMAL HIGH (ref 70–99)
Potassium: 4.5 mmol/L (ref 3.5–5.1)
Sodium: 137 mmol/L (ref 135–145)
Total Bilirubin: 0.7 mg/dL (ref 0.3–1.2)
Total Protein: 7.4 g/dL (ref 6.5–8.1)

## 2018-12-14 LAB — CBC WITH DIFFERENTIAL/PLATELET
Abs Immature Granulocytes: 0.03 10*3/uL (ref 0.00–0.07)
Basophils Absolute: 0 10*3/uL (ref 0.0–0.1)
Basophils Relative: 0 %
Eosinophils Absolute: 0 10*3/uL (ref 0.0–0.5)
Eosinophils Relative: 0 %
HCT: 39.5 % (ref 36.0–46.0)
Hemoglobin: 12.1 g/dL (ref 12.0–15.0)
Immature Granulocytes: 0 %
Lymphocytes Relative: 13 %
Lymphs Abs: 0.9 10*3/uL (ref 0.7–4.0)
MCH: 24.2 pg — ABNORMAL LOW (ref 26.0–34.0)
MCHC: 30.6 g/dL (ref 30.0–36.0)
MCV: 79 fL — ABNORMAL LOW (ref 80.0–100.0)
Monocytes Absolute: 0.6 10*3/uL (ref 0.1–1.0)
Monocytes Relative: 9 %
Neutro Abs: 5.7 10*3/uL (ref 1.7–7.7)
Neutrophils Relative %: 78 %
Platelets: 424 10*3/uL — ABNORMAL HIGH (ref 150–400)
RBC: 5 MIL/uL (ref 3.87–5.11)
RDW: 14.7 % (ref 11.5–15.5)
WBC: 7.4 10*3/uL (ref 4.0–10.5)
nRBC: 0 % (ref 0.0–0.2)

## 2018-12-14 LAB — WET PREP, GENITAL
Sperm: NONE SEEN
Trich, Wet Prep: NONE SEEN
Yeast Wet Prep HPF POC: NONE SEEN

## 2018-12-14 LAB — URINALYSIS, ROUTINE W REFLEX MICROSCOPIC
Bilirubin Urine: NEGATIVE
Glucose, UA: NEGATIVE mg/dL
Hgb urine dipstick: NEGATIVE
Ketones, ur: NEGATIVE mg/dL
Leukocytes,Ua: NEGATIVE
Nitrite: NEGATIVE
Protein, ur: NEGATIVE mg/dL
Specific Gravity, Urine: 1.013 (ref 1.005–1.030)
pH: 6 (ref 5.0–8.0)

## 2018-12-14 LAB — I-STAT BETA HCG BLOOD, ED (MC, WL, AP ONLY): I-stat hCG, quantitative: <5 m[IU]/mL (ref <5)

## 2018-12-14 LAB — LIPASE, BLOOD: Lipase: 36 U/L (ref 11–51)

## 2018-12-14 MED ORDER — MORPHINE SULFATE (PF) 4 MG/ML IV SOLN
4.0000 mg | Freq: Once | INTRAVENOUS | Status: AC
  Administered 2018-12-14: 4 mg via INTRAVENOUS
  Filled 2018-12-14: qty 1

## 2018-12-14 MED ORDER — HYDROMORPHONE HCL 1 MG/ML IJ SOLN
0.5000 mg | Freq: Once | INTRAMUSCULAR | Status: AC
  Administered 2018-12-14: 0.5 mg via INTRAVENOUS
  Filled 2018-12-14: qty 1

## 2018-12-14 MED ORDER — OXYCODONE-ACETAMINOPHEN 5-325 MG PO TABS: 1.0000 | ORAL_TABLET | Freq: Four times a day (QID) | ORAL | 0 refills | Status: DC | PRN

## 2018-12-14 MED ORDER — ONDANSETRON HCL 4 MG/2ML IJ SOLN
4.0000 mg | Freq: Once | INTRAMUSCULAR | Status: AC
  Administered 2018-12-14: 4 mg via INTRAVENOUS
  Filled 2018-12-14: qty 2

## 2018-12-14 MED ORDER — IOHEXOL 300 MG/ML  SOLN
100.0000 mL | Freq: Once | INTRAMUSCULAR | Status: AC | PRN
  Administered 2018-12-14: 100 mL via INTRAVENOUS

## 2018-12-14 MED ORDER — METRONIDAZOLE 500 MG PO TABS: 500.0000 mg | ORAL_TABLET | Freq: Two times a day (BID) | ORAL | 0 refills | Status: DC

## 2018-12-14 MED ORDER — KETOROLAC TROMETHAMINE 30 MG/ML IJ SOLN
30.0000 mg | Freq: Once | INTRAMUSCULAR | Status: AC
  Administered 2018-12-14: 30 mg via INTRAVENOUS
  Filled 2018-12-14: qty 1

## 2018-12-14 NOTE — ED Provider Notes (Signed)
[facility] EMERGENCY DEPARTMENT Provider Note   CSN: [id number] Arrival date & time: 12/14/18  1053     History   Chief Complaint Chief Complaint  Patient presents with   Abdominal Pain    HPI [name] is a 43 y.o. female with past medical history as listed below presents emergency department today with chief complaint of abdominal pain.  This is been going on x1 week.  Pain is located in her left lower quadrant and radiates to her back.  She states the pain has been intermittent.  She describes it as sharp.  She reports associated nausea without emesis.  She rates the pain 8 out of 10 in severity.  She is taking Advil for the pain with minimal symptom relief.  She denies fever, chills, chest pain, shortness of breath, gross hematuria, urinary frequency, diarrhea, blood in stool.  Denies history of diverticulitis, ovarian cysts, kidney stones.  Denies abdominal surgical history.  Patient is on clopidogrel. History provided by patient with additional history obtained from chart review.    Due to language barrier, a video interpreter was present during the history-taking and subsequent discussion (and for part of the physical exam) with this patient.     Past Medical History:  Diagnosis Date   Acute ischemic stroke ([place]) 01/31/2013   Diabetes mellitus without complication ([place])    Hypercholesteremia    Hypertension     Patient Active Problem List   Diagnosis Date Noted   Spastic hemiplegia affecting dominant side ([place]) 06/26/2017   RBC microcytosis 01/25/2016   BMI 33.0-33.9,adult 10/18/2015   Hyperlipidemia 03/03/2013   HCV antibody positive 03/03/2013   History of cerebral infarction 02/04/2013   Hypokalemia 01/31/2013   Weakness as late effect of cerebrovascular accident (CVA) 01/30/2013   HTN (hypertension) 01/30/2013   DM (diabetes mellitus) ([place]) 01/30/2013    Past Surgical History:  Procedure Laterality Date   CESAREAN SECTION         OB History   No obstetric history on file.      Home Medications    Prior to Admission medications   Medication Sig Start Date End Date Taking? Authorizing Provider  atorvastatin (LIPITOR) 80 MG tablet TAKE 1 TABLET(80 MG) BY MOUTH DAILY AT 6 PM 01/17/17   Jeffery, Domingo Mend, PA  B-D UF III MINI PEN NEEDLES 31G X 5 MM MISC USE DAILY AS DIRECTED TO INJECT INSULIN 08/06/17   Harrison Mons, PA  clopidogrel (PLAVIX) 75 MG tablet Take 1 tablet (75 mg total) by mouth daily. 11/27/16   Harrison Mons, PA  ezetimibe (ZETIA) 10 MG tablet Take 1 tablet (10 mg total) by mouth daily. 11/27/16   Harrison Mons, PA  gabapentin (NEURONTIN) 300 MG capsule Take 1 capsule (300 mg total) by mouth 3 (three) times daily. For numbness. 06/26/17   Harrison Mons, PA  Insulin Glargine (LANTUS SOLOSTAR) 100 UNIT/ML Solostar Pen ADMINISTER 25 UNITS UNDER THE SKIN DAILY AT 10:00PM 11/27/16   Harrison Mons, PA  lisinopril (PRINIVIL,ZESTRIL) 10 MG tablet TAKE 1 TABLET(10 MG) BY MOUTH DAILY 10/08/16   Harrison Mons, PA  metFORMIN (GLUCOPHAGE-XR) 500 MG 24 hr tablet Take 2 tablets (1,000 mg total) by mouth daily with breakfast. For diabetes. 06/26/17   Harrison Mons, PA  metroNIDAZOLE (FLAGYL) 500 MG tablet Take 1 tablet (500 mg total) by mouth 2 (two) times daily. 12/14/18   Arnice Vanepps E, PA-C  omega-3 acid ethyl esters (LOVAZA) 1 g capsule Take 2 capsules (2 g total) by mouth 2 (  two) times daily. 11/27/16   Harrison Mons, PA  oxyCODONE-acetaminophen (PERCOCET/ROXICET) 5-325 MG tablet Take 1 tablet by mouth every 6 (six) hours as needed for severe pain. 12/14/18   Benton Tooker, Harley Hallmark, PA-C    Family History Family History  Problem Relation Age of Onset   Stroke Mother    Hypertension Mother    Hyperlipidemia Mother    Stroke Father    Hypertension Father    Hyperlipidemia Father    Heart disease Father    Hyperlipidemia Brother     Social History Social History   Tobacco Use   Smoking  status: Never Smoker   Smokeless tobacco: Never Used  Substance Use Topics   Alcohol use: No   Drug use: No     Allergies   Patient has no known allergies.   Review of Systems Review of Systems  Constitutional: Negative for chills and fever.  HENT: Negative for congestion, ear discharge, ear pain, sinus pressure, sinus pain and sore throat.   Eyes: Negative for pain and redness.  Respiratory: Negative for cough and shortness of breath.   Cardiovascular: Negative for chest pain.  Gastrointestinal: Positive for abdominal pain and nausea. Negative for constipation, diarrhea and vomiting.  Genitourinary: Negative for dysuria and hematuria.  Musculoskeletal: Positive for back pain. Negative for neck pain.  Skin: Negative for wound.  Allergic/Immunologic: Positive for immunocompromised state.  Neurological: Negative for weakness, numbness and headaches.     Physical Exam Updated Vital Signs BP 125/77 (BP Location: Left Arm)    Pulse 96    Temp 98.6 F (37 C) (Oral)    Resp 18    Ht 4' 11.06" (1.5 m)    Wt 70.3 kg    LMP 12/04/2018 (Exact Date)    SpO2 97%    BMI 31.25 kg/m   Physical Exam Vitals signs and nursing note reviewed.  Constitutional:      General: She is not in acute distress.    Appearance: She is not ill-appearing.  HENT:     Head: Normocephalic and atraumatic.     Right Ear: Tympanic membrane and external ear normal.     Left Ear: Tympanic membrane and external ear normal.     Nose: Nose normal.     Mouth/Throat:     Mouth: Mucous membranes are moist.     Pharynx: Oropharynx is clear.  Eyes:     General: No scleral icterus.       Right eye: No discharge.        Left eye: No discharge.     Extraocular Movements: Extraocular movements intact.     Conjunctiva/sclera: Conjunctivae normal.     Pupils: Pupils are equal, round, and reactive to light.  Neck:     Musculoskeletal: Normal range of motion.     Vascular: No JVD.  Cardiovascular:     Rate and  Rhythm: Normal rate and regular rhythm.     Pulses: Normal pulses.          Radial pulses are 2+ on the right side and 2+ on the left side.     Heart sounds: Normal heart sounds.  Pulmonary:     Comments: Lungs clear to auscultation in all fields. Symmetric chest rise. No wheezing, rales, or rhonchi. Abdominal:     Tenderness: There is left CVA tenderness.     Comments: Abdomen is soft, non-distended, tender to palpation in left lower quadrant.  No rigidity, no guarding. No peritoneal signs.  Genitourinary:  Comments: Normal external genitalia. No pain with speculum insertion. Closed cervical os with normal appearance - no rash or lesions. No significant discharge or bleeding noted from cervix or in vaginal vault. On bimanual examination no adnexal tenderness or cervical motion tenderness. Chaperone Harriette Bouillon present during exam.  Musculoskeletal: Normal range of motion.  Skin:    General: Skin is warm and dry.     Capillary Refill: Capillary refill takes less than 2 seconds.  Neurological:     Mental Status: She is oriented to person, place, and time.     GCS: GCS eye subscore is 4. GCS verbal subscore is 5. GCS motor subscore is 6.     Comments: Fluent speech, no facial droop.  Psychiatric:        Behavior: Behavior normal.      ED Treatments / Results  Labs (all labs ordered are listed, but only abnormal results are displayed) Labs Reviewed  WET PREP, GENITAL - Abnormal; Notable for the following components:      Result Value   Clue Cells Wet Prep HPF POC PRESENT (*)    WBC, Wet Prep HPF POC MODERATE (*)    All other components within normal limits  COMPREHENSIVE METABOLIC PANEL - Abnormal; Notable for the following components:   Glucose, Bld 174 (*)    All other components within normal limits  CBC WITH DIFFERENTIAL/PLATELET - Abnormal; Notable for the following components:   MCV 79.0 (*)    MCH 24.2 (*)    Platelets 424 (*)    All other components within normal limits    LIPASE, BLOOD  URINALYSIS, ROUTINE W REFLEX MICROSCOPIC  RPR  HIV ANTIBODY (ROUTINE TESTING W REFLEX)  CEA  CA 125  I-STAT BETA HCG BLOOD, ED ([facility], [facility], [facility] ONLY)  GC/CHLAMYDIA PROBE AMP ([place]) NOT AT [facility]    EKG None  Radiology Ct Abdomen Pelvis W Contrast  Result Date: 12/14/2018 CLINICAL DATA:  Left lower quadrant abdominal pain. EXAM: CT ABDOMEN AND PELVIS WITH CONTRAST TECHNIQUE: Multidetector CT imaging of the abdomen and pelvis was performed using the standard protocol following bolus administration of intravenous contrast. CONTRAST:  128m OMNIPAQUE IOHEXOL 300 MG/ML  SOLN COMPARISON:  None. FINDINGS: Lower chest: Bibasilar atelectatic changes. Hepatobiliary: No focal liver abnormality is seen. No gallstones, gallbladder wall thickening, or biliary dilatation. Pancreas: Unremarkable. No pancreatic ductal dilatation or surrounding inflammatory changes. Spleen: Normal in size without focal abnormality. Adrenals/Urinary Tract: Adrenal glands are unremarkable. Kidneys are normal, without renal calculi, focal lesion, or hydronephrosis. Bladder is unremarkable. Stomach/Bowel: Stomach is within normal limits. Appendix appears normal. No evidence of bowel wall thickening, distention, or inflammatory changes. Vascular/Lymphatic: No significant vascular findings are present. No enlarged abdominal or pelvic lymph nodes. Reproductive: Leiomyomatous uterus. Normal right adnexa. Multiloculated cystic structure associated with the left adnexa with somewhat thick walls measures 5.4 cm. Other: No abdominal wall hernia or abnormality. No abdominopelvic ascites. Musculoskeletal: No acute or significant osseous findings. IMPRESSION: 1. No evidence of acute abnormalities within the solid abdominal organs. 2. Leiomyomatous uterus. 3. Multiloculated cystic structure associated with the left adnexa with somewhat thick walls measures 5.4 cm. This may represent a complicated ovarian cyst, but it is incompletely  evaluated by CT. Further evaluation with pelvic ultrasound may be considered. Electronically Signed   By: DFidela SalisburyM.D.   On: 12/14/2018 15:11   UKoreaPelvic Complete With Transvaginal  Result Date: 12/14/2018 CLINICAL DATA:  Pelvic pain for 6 days EXAM: TRANSABDOMINAL AND TRANSVAGINAL ULTRASOUND OF PELVIS  DOPPLER ULTRASOUND OF OVARIES TECHNIQUE: Both transabdominal and transvaginal ultrasound examinations of the pelvis were performed. Transabdominal technique was performed for global imaging of the pelvis including uterus, ovaries, adnexal regions, and pelvic cul-de-sac. It was necessary to proceed with endovaginal exam following the transabdominal exam to visualize the . Color and duplex Doppler ultrasound was utilized to evaluate blood flow to the ovaries. Both transabdominal and transvaginal ultrasound examinations of the pelvis were performed. Transabdominal technique was performed for global imaging of the pelvis including uterus, ovaries, adnexal regions, and pelvic cul-de-sac. It was necessary to proceed with endovaginal exam following the transabdominal exam to visualize the uterus, endometrium and ovaries. COMPARISON:  None. FINDINGS: Uterus Measurements: 11.9 x 5.7 x 6.2 cm = volume: 220 mL. -along the anterior myometrium there is a sub mucosal fibroid measuring 3.1 x 3.1 x 2.2 cm. -along the posterior myometrium there is a 1.5 x 1.2 x 1.5 cm intramural fibroid. -within the lower uterine segment there is a sub mucosal fibroid measuring 2.4 x 2.8 x 1.9 cm Endometrium Thickness: 5 mm.  No focal abnormality visualized. Right ovary Measurements: 2.6 x 2.0 x 2.6 cm = volume: 6.4 mL. Normal appearance/no adnexal mass. Left ovary Measurements: 6.6 x 5.4 x 5.8 cm = volume: 108.5 mL. Complex, multiloculated cystic mass is identified within the left ovary measuring 5.1 by 3.9 by 5.4 cm. The dominant loculated component contains diffuse low-level internal echoes. The 2 smaller components word are  separated by a thin internal area of septation appear anechoic with increased through transmission. Pulsed Doppler evaluation of both ovaries demonstrates normal low-resistance arterial and venous waveforms. Other findings No abnormal free fluid. IMPRESSION: 1. Complex multi loculated cystic mass is identified within the left ovary. Differential considerations include hemorrhagic cyst, endometrioma, or tubo-ovarian abscess. Cystic ovarian neoplasm is not excluded. If there are no signs or symptoms of infection then Short-interval follow up ultrasound in 6-12 weeks is recommended, preferably during the week following the patient's normal menses. 2. Uterine fibroids. Electronically Signed   By: Kerby Moors M.D.   On: 12/14/2018 17:08    Procedures Procedures (including critical care time)  Medications Ordered in ED Medications  morphine 4 MG/ML injection 4 mg (4 mg Intravenous Given 12/14/18 1304)  ondansetron (ZOFRAN) injection 4 mg (4 mg Intravenous Given 12/14/18 1304)  HYDROmorphone (DILAUDID) injection 0.5 mg (0.5 mg Intravenous Given 12/14/18 1515)  ondansetron (ZOFRAN) injection 4 mg (4 mg Intravenous Given 12/14/18 1515)  iohexol (OMNIPAQUE) 300 MG/ML solution 100 mL (100 mLs Intravenous Contrast Given 12/14/18 1454)  ketorolac (TORADOL) 30 MG/ML injection 30 mg (30 mg Intravenous Given 12/14/18 1836)     Initial Impression / Assessment and Plan / ED Course  I have reviewed the triage vital signs and the nursing notes.  Pertinent labs & imaging results that were available during my care of the patient were reviewed by me and considered in my medical decision making (see chart for details).  Patient seen and examined.  She presents with left lower quadrant pain that radiates to her back.  She is afebrile, looks to be uncomfortable however is nontoxic in appearance.  On exam she has tenderness to left lower quadrant as well as left CVA tenderness.  Work-up initiated with labs and CT of abdomen  pelvis. Overall unremarkable.  No leukocytosis, no severe electrolyte derangement, no renal insufficiency.  UA is without signs of infection.  Beta-hCG is negative.  Lipase is within normal range. CT abdomen pelvis shows multiloculated cystic structure associated with  the left adnexa with thick walls.  Pelvis US with radiologist reports of complex multi loculated cystic mass is identified within the left ovary. Differential considerations include hemorrhagic cyst, endometrioma, or tubo-ovarian abscess. Cystic ovarian neoplasm is not excluded. Pelvic exam performed chaperone.  There was scant white discharge seen in the vaginal vault.  Wet prep is positive for clue cells and WBC.  She reports pain has significantly improved after Toradol.  On repeat exam abdomen is nontender no peritoneal signs. Case discussed with gyn attending Dr. Hulan Fray who would like labs drawn including CA 125 and CEA.  These are send out labs that she will be able to follow-up the results in office.  She did not recommend antibiotics as patient is afebrile and has no white count. Will discharge with prescription for Flagyl for bacterial vaginosis and percocet for severe pain.I have reviewed the PDMP during this encounter. Pt has no recent narcotic prescriptions. Findings and plan of care discussed with ED supervising physician Dr. Johnney Killian.  The patient appears reasonably screened and/or stabilized for discharge and I doubt any other medical condition or other [facility] requiring further screening, evaluation, or treatment in the ED at this time prior to discharge. The patient is safe for discharge with strict return precautions discussed.   This note was prepared using Dragon voice recognition software and may include unintentional dictation errors due to the inherent limitations of voice recognition software.  Final Clinical Impressions(s) / ED Diagnoses   Final diagnoses:  Pelvic pain in female    ED Discharge Orders          Ordered    oxyCODONE-acetaminophen (PERCOCET/ROXICET) 5-325 MG tablet  Every 6 hours PRN     12/14/18 1933    metroNIDAZOLE (FLAGYL) 500 MG tablet  2 times daily     12/14/18 1940           Tyreck Bell, Harley Hallmark, PA-C 12/15/18 0175    Charlesetta Shanks, MD 12/19/18 1204

## 2018-12-14 NOTE — ED Triage Notes (Signed)
Patient arrives via POV with c/o of left sided abdominal pain that radiates to her back x1 week. Patient has had difficulty resting and the pain is a 9/10.

## 2018-12-14 NOTE — Discharge Instructions (Signed)
°  B?n c?n ti khm v?i bc s? ph? khoa. S? ?i?n tho?i c?a h? c trong gi?y t? c?a b?n. V?n phng s? g?i cho b?n vo th? Ba. N?u b?n khng nh?n ???c thng tin t? h?, b?n c th? g?i ??n s? ???c li?t k cho Bc s? [place] ?? l?y h?n.   M?t ??n thu?c ? ???c g?i ??n hi?u thu?c ?? mua percocet. ?y l m?t lo?i thu?c gi?m ?au c ch?t gy m. Xin hy dng cho nh?ng c?n ?au d? d?i.  C?ng dng ibuprofen ?? gi?m ?au khi c?n thi?t.

## 2018-12-14 NOTE — ED Notes (Signed)
Pt dc'd home with all belongings, a/o x4, drove self home, no narcotics given by the RN

## 2018-12-14 NOTE — ED Notes (Signed)
Pt to ct 

## 2018-12-15 LAB — RPR: RPR Ser Ql: NONREACTIVE

## 2018-12-16 ENCOUNTER — Telehealth: Payer: Self-pay | Admitting: Obstetrics & Gynecology

## 2018-12-16 LAB — HIV ANTIBODY (ROUTINE TESTING W REFLEX): HIV Screen 4th Generation wRfx: NONREACTIVE

## 2018-12-16 LAB — CA 125: Cancer Antigen (CA) 125: 47.8 U/mL — ABNORMAL HIGH (ref 0.0–38.1)

## 2018-12-16 LAB — CEA: CEA: 2.9 ng/mL (ref 0.0–4.7)

## 2018-12-16 NOTE — Telephone Encounter (Signed)
Attempted to reach patient to get her scheduled for an appointment. Left a detailed voicemail message using the Interpreter line.

## 2018-12-17 ENCOUNTER — Encounter: Payer: BC Managed Care – PPO | Admitting: Obstetrics & Gynecology

## 2018-12-17 LAB — GC/CHLAMYDIA PROBE AMP (~~LOC~~) NOT AT ARMC
Chlamydia: NEGATIVE
Neisseria Gonorrhea: NEGATIVE

## 2018-12-22 DIAGNOSIS — E1159 Type 2 diabetes mellitus with other circulatory complications: Secondary | ICD-10-CM | POA: Diagnosis not present

## 2018-12-22 DIAGNOSIS — R718 Other abnormality of red blood cells: Secondary | ICD-10-CM | POA: Diagnosis not present

## 2018-12-22 DIAGNOSIS — R1032 Left lower quadrant pain: Secondary | ICD-10-CM | POA: Diagnosis not present

## 2018-12-22 DIAGNOSIS — I10 Essential (primary) hypertension: Secondary | ICD-10-CM | POA: Diagnosis not present

## 2018-12-22 DIAGNOSIS — E1169 Type 2 diabetes mellitus with other specified complication: Secondary | ICD-10-CM | POA: Diagnosis not present

## 2018-12-22 DIAGNOSIS — E785 Hyperlipidemia, unspecified: Secondary | ICD-10-CM | POA: Diagnosis not present

## 2018-12-29 DIAGNOSIS — N83202 Unspecified ovarian cyst, left side: Secondary | ICD-10-CM | POA: Diagnosis not present

## 2019-01-08 ENCOUNTER — Other Ambulatory Visit: Payer: Self-pay

## 2019-01-08 ENCOUNTER — Emergency Department (HOSPITAL_COMMUNITY)
Admission: EM | Admit: 2019-01-08 | Discharge: 2019-01-08 | Disposition: A | Discharge status: Home / Self Care | Payer: BC Managed Care – PPO | Attending: Emergency Medicine | Admitting: Emergency Medicine

## 2019-01-08 ENCOUNTER — Encounter (HOSPITAL_COMMUNITY): Payer: Self-pay | Admitting: Emergency Medicine

## 2019-01-08 DIAGNOSIS — R109 Unspecified abdominal pain: Secondary | ICD-10-CM | POA: Diagnosis not present

## 2019-01-08 DIAGNOSIS — N83202 Unspecified ovarian cyst, left side: Secondary | ICD-10-CM | POA: Insufficient documentation

## 2019-01-08 DIAGNOSIS — E119 Type 2 diabetes mellitus without complications: Secondary | ICD-10-CM | POA: Diagnosis not present

## 2019-01-08 DIAGNOSIS — I10 Essential (primary) hypertension: Secondary | ICD-10-CM | POA: Insufficient documentation

## 2019-01-08 DIAGNOSIS — Z79899 Other long term (current) drug therapy: Secondary | ICD-10-CM | POA: Insufficient documentation

## 2019-01-08 DIAGNOSIS — Z794 Long term (current) use of insulin: Secondary | ICD-10-CM | POA: Diagnosis not present

## 2019-01-08 DIAGNOSIS — Z7902 Long term (current) use of antithrombotics/antiplatelets: Secondary | ICD-10-CM | POA: Diagnosis not present

## 2019-01-08 DIAGNOSIS — R1032 Left lower quadrant pain: Secondary | ICD-10-CM | POA: Diagnosis not present

## 2019-01-08 DIAGNOSIS — R000 Tachycardia, unspecified: Secondary | ICD-10-CM | POA: Diagnosis not present

## 2019-01-08 LAB — CBC WITH DIFFERENTIAL/PLATELET
Abs Immature Granulocytes: 0.06 10*3/uL (ref 0.00–0.07)
Basophils Absolute: 0 10*3/uL (ref 0.0–0.1)
Basophils Relative: 0 %
Eosinophils Absolute: 0 10*3/uL (ref 0.0–0.5)
Eosinophils Relative: 0 %
HCT: 43.3 % (ref 36.0–46.0)
Hemoglobin: 13.3 g/dL (ref 12.0–15.0)
Immature Granulocytes: 1 %
Lymphocytes Relative: 10 %
Lymphs Abs: 1 10*3/uL (ref 0.7–4.0)
MCH: 24.4 pg — ABNORMAL LOW (ref 26.0–34.0)
MCHC: 30.7 g/dL (ref 30.0–36.0)
MCV: 79.4 fL — ABNORMAL LOW (ref 80.0–100.0)
Monocytes Absolute: 0.5 10*3/uL (ref 0.1–1.0)
Monocytes Relative: 5 %
Neutro Abs: 8.2 10*3/uL — ABNORMAL HIGH (ref 1.7–7.7)
Neutrophils Relative %: 84 %
Platelets: 445 10*3/uL — ABNORMAL HIGH (ref 150–400)
RBC: 5.45 MIL/uL — ABNORMAL HIGH (ref 3.87–5.11)
RDW: 15.3 % (ref 11.5–15.5)
WBC: 9.7 10*3/uL (ref 4.0–10.5)
nRBC: 0 % (ref 0.0–0.2)

## 2019-01-08 LAB — BASIC METABOLIC PANEL
Anion gap: 10 (ref 5–15)
BUN: 23 mg/dL — ABNORMAL HIGH (ref 6–20)
CO2: 24 mmol/L (ref 22–32)
Calcium: 10.2 mg/dL (ref 8.9–10.3)
Chloride: 102 mmol/L (ref 98–111)
Creatinine, Ser: 0.86 mg/dL (ref 0.44–1.00)
GFR calc Af Amer: >60 mL/min (ref >60)
GFR calc non Af Amer: >60 mL/min (ref >60)
Glucose, Bld: 238 mg/dL — ABNORMAL HIGH (ref 70–99)
Potassium: 5.6 mmol/L — ABNORMAL HIGH (ref 3.5–5.1)
Sodium: 136 mmol/L (ref 135–145)

## 2019-01-08 LAB — URINALYSIS, ROUTINE W REFLEX MICROSCOPIC
Bilirubin Urine: NEGATIVE
Glucose, UA: >=500 mg/dL — AB
Hgb urine dipstick: NEGATIVE
Ketones, ur: NEGATIVE mg/dL
Leukocytes,Ua: NEGATIVE
Nitrite: NEGATIVE
Protein, ur: NEGATIVE mg/dL
Specific Gravity, Urine: 1.005 (ref 1.005–1.030)
pH: 7 (ref 5.0–8.0)

## 2019-01-08 LAB — URINALYSIS, MICROSCOPIC (REFLEX)
Bacteria, UA: NONE SEEN
RBC / HPF: NONE SEEN RBC/hpf (ref 0–5)

## 2019-01-08 LAB — I-STAT BETA HCG BLOOD, ED (MC, WL, AP ONLY): I-stat hCG, quantitative: <5 m[IU]/mL (ref <5)

## 2019-01-08 MED ORDER — KETOROLAC TROMETHAMINE 60 MG/2ML IM SOLN
15.0000 mg | Freq: Once | INTRAMUSCULAR | Status: AC
  Administered 2019-01-08: 15 mg via INTRAMUSCULAR
  Filled 2019-01-08: qty 2

## 2019-01-08 MED ORDER — OXYCODONE HCL 5 MG PO TABS
5.0000 mg | ORAL_TABLET | Freq: Once | ORAL | Status: AC
  Administered 2019-01-08: 5 mg via ORAL
  Filled 2019-01-08: qty 1

## 2019-01-08 MED ORDER — MORPHINE SULFATE 15 MG PO TABS: 15.0000 mg | ORAL_TABLET | ORAL | 0 refills | Status: DC | PRN

## 2019-01-08 MED ORDER — NAPROXEN 500 MG PO TABS: 500.0000 mg | ORAL_TABLET | Freq: Two times a day (BID) | ORAL | 0 refills | Status: DC

## 2019-01-08 MED ORDER — ACETAMINOPHEN 500 MG PO TABS
1000.0000 mg | ORAL_TABLET | Freq: Once | ORAL | Status: AC
  Administered 2019-01-08: 1000 mg via ORAL
  Filled 2019-01-08: qty 2

## 2019-01-08 NOTE — ED Provider Notes (Signed)
[facility] EMERGENCY DEPARTMENT Provider Note   CSN: [id number] Arrival date & time: 01/08/19  6283     History   Chief Complaint Chief Complaint  Patient presents with   Back Pain    HPI [name] is a 43 y.o. female.     43 yo F with a chief complaint of left low back and left lower quadrant abdominal pain.  This been going on for a while now.  The patient was seen in the ED previously and diagnosed with a complex ovarian cyst.  She was then sent to see her family doctor who has scheduled for her to be seen at the [facility] hospital for repeat ultrasound and likely surgery coming up in about 20 days time.  Since then the patient feels that her pain has gotten gradually worse.  Worse with movement palpation ambulation.  She has been taking Tylenol for it but is not improved.  They had discussed this with her family physician who did not prescribe them any other medications or offer any other options for pain management.  Denies fevers or chills.  Denies urinary symptoms.  Denies nausea or vomiting.  Denies radiation down the leg.  Denies trauma.  The history is provided by the patient and a relative. The history is limited by a language barrier. A language interpreter was used (son and uncle and video).  Back Pain Associated symptoms: abdominal pain   Associated symptoms: no chest pain, no dysuria, no fever and no headaches   Illness Severity:  Moderate Onset quality:  Gradual Duration:  2 months Timing:  Constant Progression:  Worsening Chronicity:  New Associated symptoms: abdominal pain   Associated symptoms: no chest pain, no congestion, no fever, no headaches, no myalgias, no nausea, no rhinorrhea, no shortness of breath, no vomiting and no wheezing     Past Medical History:  Diagnosis Date   Acute ischemic stroke ([place]) 01/31/2013   Diabetes mellitus without complication ([place])    Hypercholesteremia    Hypertension     Patient Active Problem  List   Diagnosis Date Noted   Spastic hemiplegia affecting dominant side ([place]) 06/26/2017   RBC microcytosis 01/25/2016   BMI 33.0-33.9,adult 10/18/2015   Hyperlipidemia 03/03/2013   HCV antibody positive 03/03/2013   History of cerebral infarction 02/04/2013   Hypokalemia 01/31/2013   Weakness as late effect of cerebrovascular accident (CVA) 01/30/2013   HTN (hypertension) 01/30/2013   DM (diabetes mellitus) ([place]) 01/30/2013    Past Surgical History:  Procedure Laterality Date   CESAREAN SECTION       OB History   No obstetric history on file.      Home Medications    Prior to Admission medications   Medication Sig Start Date End Date Taking? Authorizing Provider  atorvastatin (LIPITOR) 80 MG tablet TAKE 1 TABLET(80 MG) BY MOUTH DAILY AT 6 PM 01/17/17   Jeffery, Domingo Mend, PA  B-D UF III MINI PEN NEEDLES 31G X 5 MM MISC USE DAILY AS DIRECTED TO INJECT INSULIN 08/06/17   Harrison Mons, PA  clopidogrel (PLAVIX) 75 MG tablet Take 1 tablet (75 mg total) by mouth daily. 11/27/16   Harrison Mons, PA  ezetimibe (ZETIA) 10 MG tablet Take 1 tablet (10 mg total) by mouth daily. 11/27/16   Harrison Mons, PA  gabapentin (NEURONTIN) 300 MG capsule Take 1 capsule (300 mg total) by mouth 3 (three) times daily. For numbness. 06/26/17   Harrison Mons, PA  Insulin Glargine (LANTUS SOLOSTAR) 100 UNIT/ML Solostar  Pen ADMINISTER 25 UNITS UNDER THE SKIN DAILY AT 10:00PM 11/27/16   Harrison Mons, PA  lisinopril (PRINIVIL,ZESTRIL) 10 MG tablet TAKE 1 TABLET(10 MG) BY MOUTH DAILY 10/08/16   Harrison Mons, PA  metFORMIN (GLUCOPHAGE-XR) 500 MG 24 hr tablet Take 2 tablets (1,000 mg total) by mouth daily with breakfast. For diabetes. 06/26/17   Harrison Mons, PA  metroNIDAZOLE (FLAGYL) 500 MG tablet Take 1 tablet (500 mg total) by mouth 2 (two) times daily. 12/14/18   Albrizze, Kaitlyn E, PA-C  morphine (MSIR) 15 MG tablet Take 1 tablet (15 mg total) by mouth every 4 (four) hours as needed for  severe pain. 01/08/19   Deno Etienne, DO  naproxen (NAPROSYN) 500 MG tablet Take 1 tablet (500 mg total) by mouth 2 (two) times daily. 01/08/19   Deno Etienne, DO  omega-3 acid ethyl esters (LOVAZA) 1 g capsule Take 2 capsules (2 g total) by mouth 2 (two) times daily. 11/27/16   Harrison Mons, PA  oxyCODONE-acetaminophen (PERCOCET/ROXICET) 5-325 MG tablet Take 1 tablet by mouth every 6 (six) hours as needed for severe pain. 12/14/18   Albrizze, Harley Hallmark, PA-C    Family History Family History  Problem Relation Age of Onset   Stroke Mother    Hypertension Mother    Hyperlipidemia Mother    Stroke Father    Hypertension Father    Hyperlipidemia Father    Heart disease Father    Hyperlipidemia Brother     Social History Social History   Tobacco Use   Smoking status: Never Smoker   Smokeless tobacco: Never Used  Substance Use Topics   Alcohol use: No   Drug use: No     Allergies   Patient has no known allergies.   Review of Systems Review of Systems  Constitutional: Negative for chills and fever.  HENT: Negative for congestion and rhinorrhea.   Eyes: Negative for redness and visual disturbance.  Respiratory: Negative for shortness of breath and wheezing.   Cardiovascular: Negative for chest pain and palpitations.  Gastrointestinal: Positive for abdominal pain. Negative for nausea and vomiting.  Genitourinary: Negative for dysuria and urgency.  Musculoskeletal: Positive for back pain. Negative for arthralgias and myalgias.  Skin: Negative for pallor and wound.  Neurological: Negative for dizziness and headaches.     Physical Exam Updated Vital Signs BP (!) 142/79    Pulse (!) 102    Temp 98.4 F (36.9 C) (Oral)    Resp 16    LMP 12/19/2018 (Approximate)    SpO2 98%   Physical Exam Vitals signs and nursing note reviewed.  Constitutional:      General: She is not in acute distress.    Appearance: She is well-developed. She is not diaphoretic.  HENT:      Head: Normocephalic and atraumatic.  Eyes:     Pupils: Pupils are equal, round, and reactive to light.  Neck:     Musculoskeletal: Normal range of motion and neck supple.  Cardiovascular:     Rate and Rhythm: Normal rate and regular rhythm.     Heart sounds: No murmur. No friction rub. No gallop.   Pulmonary:     Effort: Pulmonary effort is normal.     Breath sounds: No wheezing or rales.  Abdominal:     General: There is no distension.     Palpations: Abdomen is soft.     Tenderness: There is no abdominal tenderness.     Comments: Some mild fullness to the left lower quadrant.  Minimal tenderness.  Musculoskeletal:        General: Tenderness present.     Comments: Tenderness along the left lumbar paraspinal musculature.  No midline tenderness.  Pulse motor and sensation are intact to the left lower extremity.  Reflexes are 2+ and equal.  Skin:    General: Skin is warm and dry.  Neurological:     Mental Status: She is alert and oriented to person, place, and time.  Psychiatric:        Behavior: Behavior normal.      ED Treatments / Results  Labs (all labs ordered are listed, but only abnormal results are displayed) Labs Reviewed  CBC WITH DIFFERENTIAL/PLATELET - Abnormal; Notable for the following components:      Result Value   RBC 5.45 (*)    MCV 79.4 (*)    MCH 24.4 (*)    Platelets 445 (*)    Neutro Abs 8.2 (*)    All other components within normal limits  BASIC METABOLIC PANEL - Abnormal; Notable for the following components:   Potassium 5.6 (*)    Glucose, Bld 238 (*)    BUN 23 (*)    All other components within normal limits  URINALYSIS, ROUTINE W REFLEX MICROSCOPIC - Abnormal; Notable for the following components:   Color, Urine STRAW (*)    Glucose, UA >=500 (*)    All other components within normal limits  URINALYSIS, MICROSCOPIC (REFLEX)  I-STAT BETA HCG BLOOD, ED ([facility], [facility], [facility] ONLY)    EKG EKG Interpretation  Date/Time:  Thursday January 08 2019  09:15:01 EDT Ventricular Rate:  100 PR Interval:    QRS Duration: 83 QT Interval:  352 QTC Calculation: 454 R Axis:   95 Text Interpretation:  Sinus tachycardia Borderline right axis deviation Low voltage, precordial leads Anteroseptal infarct, old no peaked t waves, bradycardia, qrs widening or other concern for hyperK No old tracing to compare Confirmed by Deno Etienne [phone]) on 01/08/2019 9:17:53 AM   Radiology No results found.  Procedures Procedures (including critical care time)  Medications Ordered in ED Medications  acetaminophen (TYLENOL) tablet 1,000 mg (1,000 mg Oral Given 01/08/19 0907)  ketorolac (TORADOL) injection 15 mg (15 mg Intramuscular Given 01/08/19 0907)  oxyCODONE (Oxy IR/ROXICODONE) immediate release tablet 5 mg (5 mg Oral Given 01/08/19 0907)     Initial Impression / Assessment and Plan / ED Course  I have reviewed the triage vital signs and the nursing notes.  Pertinent labs & imaging results that were available during my care of the patient were reviewed by me and considered in my medical decision making (see chart for details).        43 yo F with a chief complaint of left low back pain and left lower quadrant abdominal pain.  Patient was diagnosed with a complex ovarian cyst which is thought to be the source of the patient's symptoms.  There could be a component of musculoskeletal as this is also painful with palpation of the left low back and reproduced with ambulation.  Patient had lab work done in triage that is largely unremarkable.  UA is negative for infection.  She has no leukocytosis.  Her creatinine is mildly above but her baseline is and her potassium is 5.6.  Will obtain an EKG. EKG without concerning findings for hyperkalemia.  She is on lisinopril we will have her hold that.  Have her reduce foods that have high potassium intake.  PCP follow-up in a few days for recheck.  Treat her pain here.  I discussed with the patient the limitation of  treating her pain from the ED setting.  This is typically reserved to the specialists and care for her family physician.  I reiterated she needed to discuss with her family physician that she is having worsening pain and is looking for regimen that can help her until she has her procedure scheduled later this month.  I suggested they call the PCP today and discussed this with them.  I will give a short course of pain medicine.  They are requesting for a prescription for naproxen.  9:18 AM:  I have discussed the diagnosis/risks/treatment options with the patient and family and believe the pt to be eligible for discharge home to follow-up with PCP. We also discussed returning to the ED immediately if new or worsening sx occur. We discussed the sx which are most concerning (e.g., sudden worsening pain, fever, inability to tolerate by mouth) that necessitate immediate return. Medications administered to the patient during their visit and any new prescriptions provided to the patient are listed below.  Medications given during this visit Medications  acetaminophen (TYLENOL) tablet 1,000 mg (1,000 mg Oral Given 01/08/19 0907)  ketorolac (TORADOL) injection 15 mg (15 mg Intramuscular Given 01/08/19 0907)  oxyCODONE (Oxy IR/ROXICODONE) immediate release tablet 5 mg (5 mg Oral Given 01/08/19 0907)     The patient appears reasonably screen and/or stabilized for discharge and I doubt any other medical condition or other [facility] requiring further screening, evaluation, or treatment in the ED at this time prior to discharge.     Final Clinical Impressions(s) / ED Diagnoses   Final diagnoses:  Left ovarian cyst    ED Discharge Orders         Ordered    naproxen (NAPROSYN) 500 MG tablet  2 times daily     01/08/19 0900    morphine (MSIR) 15 MG tablet  Every 4 hours PRN     01/08/19 0900           Deno Etienne, DO 01/08/19 [phone]

## 2019-01-08 NOTE — Discharge Instructions (Addendum)
Please discuss your visit with the PCP today.  Discussed with them that you are having significant pain and that they need to offer some options for you.  Your potassium was elevated today.  This needs to be rechecked.  Please call your family doctor and discuss a time for them to have this level rechecked.  Try to avoid foods that are high in potassium. Hold your lisinopril until OK'd by your PCP.   Take 4 over the counter ibuprofen tablets 3 times a day or 2 over-the-counter naproxen tablets twice a day for pain(or you can take the prescription NSAID). Also take tylenol 1083m(2 extra strength) four times a day.   Then take the pain medicine if you feel like you need it. Narcotics do not help with the pain, they only make you care about it less.  You can become addicted to this, people may break into your house to steal it.  It will constipate you.  If you drive under the influence of this medicine you can get a DUI.

## 2019-01-08 NOTE — ED Triage Notes (Signed)
Patient reports chronic low back pain for > 1 month , denies injury or urinary discomfort , no fever or chills , pain increases with movement /changing positions .

## 2019-01-14 DIAGNOSIS — R971 Elevated cancer antigen 125 [CA 125]: Secondary | ICD-10-CM | POA: Diagnosis not present

## 2019-01-14 DIAGNOSIS — N83202 Unspecified ovarian cyst, left side: Secondary | ICD-10-CM | POA: Diagnosis not present

## 2019-01-14 DIAGNOSIS — R1032 Left lower quadrant pain: Secondary | ICD-10-CM | POA: Diagnosis not present

## 2019-01-14 DIAGNOSIS — E875 Hyperkalemia: Secondary | ICD-10-CM | POA: Diagnosis not present

## 2019-01-15 DIAGNOSIS — E875 Hyperkalemia: Secondary | ICD-10-CM | POA: Diagnosis not present

## 2019-01-21 DIAGNOSIS — R1032 Left lower quadrant pain: Secondary | ICD-10-CM | POA: Diagnosis not present

## 2019-01-22 ENCOUNTER — Telehealth: Payer: Self-pay | Admitting: *Deleted

## 2019-01-22 NOTE — Telephone Encounter (Signed)
Called and left the patient a message using [place] to call the office back to schedule new appt

## 2019-01-23 ENCOUNTER — Telehealth: Payer: Self-pay | Admitting: *Deleted

## 2019-01-23 NOTE — Telephone Encounter (Signed)
Called and spoke with the patient's brother, scheduled appt for 10/27. Per his request sent appt information and directions

## 2019-02-03 ENCOUNTER — Inpatient Hospital Stay: Payer: BC Managed Care – PPO | Admitting: Gynecologic Oncology

## 2019-02-04 ENCOUNTER — Telehealth: Payer: Self-pay

## 2019-02-04 ENCOUNTER — Other Ambulatory Visit: Payer: Self-pay | Admitting: Gynecologic Oncology

## 2019-02-04 ENCOUNTER — Inpatient Hospital Stay: Payer: BC Managed Care – PPO | Attending: Gynecologic Oncology | Admitting: Gynecologic Oncology

## 2019-02-04 ENCOUNTER — Other Ambulatory Visit: Payer: Self-pay

## 2019-02-04 ENCOUNTER — Inpatient Hospital Stay: Payer: BC Managed Care – PPO

## 2019-02-04 ENCOUNTER — Encounter: Payer: Self-pay | Admitting: Gynecologic Oncology

## 2019-02-04 VITALS — BP 136/83 | HR 114 | Temp 98.5°F | Resp 17 | Ht 59.06 in | Wt 172.2 lb

## 2019-02-04 DIAGNOSIS — D251 Intramural leiomyoma of uterus: Secondary | ICD-10-CM

## 2019-02-04 DIAGNOSIS — N83202 Unspecified ovarian cyst, left side: Secondary | ICD-10-CM | POA: Insufficient documentation

## 2019-02-04 DIAGNOSIS — I10 Essential (primary) hypertension: Secondary | ICD-10-CM | POA: Diagnosis not present

## 2019-02-04 DIAGNOSIS — R971 Elevated cancer antigen 125 [CA 125]: Secondary | ICD-10-CM | POA: Diagnosis not present

## 2019-02-04 DIAGNOSIS — D252 Subserosal leiomyoma of uterus: Secondary | ICD-10-CM

## 2019-02-04 DIAGNOSIS — Z6835 Body mass index (BMI) 35.0-35.9, adult: Secondary | ICD-10-CM | POA: Diagnosis not present

## 2019-02-04 DIAGNOSIS — D250 Submucous leiomyoma of uterus: Secondary | ICD-10-CM | POA: Diagnosis not present

## 2019-02-04 DIAGNOSIS — E7800 Pure hypercholesterolemia, unspecified: Secondary | ICD-10-CM | POA: Diagnosis not present

## 2019-02-04 DIAGNOSIS — Z79899 Other long term (current) drug therapy: Secondary | ICD-10-CM | POA: Diagnosis not present

## 2019-02-04 DIAGNOSIS — Z794 Long term (current) use of insulin: Secondary | ICD-10-CM | POA: Insufficient documentation

## 2019-02-04 DIAGNOSIS — Z791 Long term (current) use of non-steroidal anti-inflammatories (NSAID): Secondary | ICD-10-CM | POA: Diagnosis not present

## 2019-02-04 DIAGNOSIS — E119 Type 2 diabetes mellitus without complications: Secondary | ICD-10-CM | POA: Insufficient documentation

## 2019-02-04 DIAGNOSIS — E669 Obesity, unspecified: Secondary | ICD-10-CM | POA: Diagnosis not present

## 2019-02-04 LAB — CBC WITH DIFFERENTIAL (CANCER CENTER ONLY)
Abs Immature Granulocytes: 0.02 10*3/uL (ref 0.00–0.07)
Basophils Absolute: 0 10*3/uL (ref 0.0–0.1)
Basophils Relative: 0 %
Eosinophils Absolute: 0.1 10*3/uL (ref 0.0–0.5)
Eosinophils Relative: 2 %
HCT: 38.4 % (ref 36.0–46.0)
Hemoglobin: 12.1 g/dL (ref 12.0–15.0)
Immature Granulocytes: 0 %
Lymphocytes Relative: 18 %
Lymphs Abs: 1.2 10*3/uL (ref 0.7–4.0)
MCH: 23.9 pg — ABNORMAL LOW (ref 26.0–34.0)
MCHC: 31.5 g/dL (ref 30.0–36.0)
MCV: 75.9 fL — ABNORMAL LOW (ref 80.0–100.0)
Monocytes Absolute: 1 10*3/uL (ref 0.1–1.0)
Monocytes Relative: 14 %
Neutro Abs: 4.6 10*3/uL (ref 1.7–7.7)
Neutrophils Relative %: 66 %
Platelet Count: 395 10*3/uL (ref 150–400)
RBC: 5.06 MIL/uL (ref 3.87–5.11)
RDW: 14.4 % (ref 11.5–15.5)
WBC Count: 7.1 10*3/uL (ref 4.0–10.5)
nRBC: 0 % (ref 0.0–0.2)

## 2019-02-04 MED ORDER — TRAMADOL HCL 50 MG PO TABS: 100.0000 mg | ORAL_TABLET | Freq: Four times a day (QID) | ORAL | 0 refills | Status: AC | PRN

## 2019-02-04 NOTE — Telephone Encounter (Signed)
I spoke with [name]'s brother. I let him know the patient does not have an infection and surgery will continue as planned.  He relayed this to the patient.  All questions were answered.  Mr. [name] verbalized understanding

## 2019-02-04 NOTE — H&P (View-Only) (Signed)
Consult Note: Gyn-Onc  Consult was requested by Dr. Marcelo Baldy for the evaluation of [name] 43 y.o. female  CC:  Chief Complaint  Patient presents with  . Left ovarian cyst  . Elevated CA-125    Assessment/Plan:  Lisa. Sydne Krahl  is a 43 y.o.  year old with what is likely an endometrioma versus a hemorrhagic cyst or tubo-ovarian abscess. She additionally has a fibroid uterus.   I reviewed her CT scan images with the patient.  I discussed my differential diagnosis.  I explained that I have a low suspicion for malignancy despite the mild elevation in Ca1 25, as this is a common finding in premenopausal patients with complex benign pelvic pathology.  I offered her either hysterectomy with LSO versus LSO.  I explained that she would continue to have heavy periods if we performed LSO alone.  I explained that hysterectomy would be associated with an increased risk for complications and prolonged recovery.  However it would be a more definitive procedure for her gynecologic complaints.  Understanding the longer recovery involved with hysterectomy resulted in the patient declining hysterectomy electively.  I explained that in the setting of a finding of malignancy we would need to proceed with hysterectomy as part of the staging procedure.  We will offer her a robotic assisted left salpingo-oophorectomy.  I explained risks of the procedure.  She expressed expressed that she was upset of having to wait 2 weeks for her surgery due to the pain.  I prescribed her 20 tablets of tramadol but explained why I could not prescribe prolonged opioid analgesics due to the concern for risk of development of addiction.  I explained that postoperatively she will be prescribed 10 tablets for postoperative use of an opioid analgesic but with no refills.  I explained the surgical procedure, and the anticipated recovery.  I am anticipating a more complex dissection due to the thick-walled appearance of the cystic  mass.  We will check a CBC today to evaluate for elevated white blood cell count, because if this is elevated, it suggest she may have a tubo-ovarian abscess and she would require preoperative antibiotics to decrease the risk of perioperative complications including sepsis and pelvic abscess.   HPI: [name] is a 43 year old P2 who is seen in consultation at the request of Dr Odie Sera for evaluation of a complex left ovarian cyst and fibroid uterus.  The patient reports a 58-monthhistory of left pelvic pain that is constant.  She has been seen in the ER twice for this pain and has been prescribed Percocet.  She reports the pain is constant.  It is not related to her menstrual period additionally she has a history of menorrhagia associated with a known fibroid uterus.  Medical history is significant for obesity with a BMI of 35 kg per metered squared, in addition to type 2 diabetes mellitus for which she takes insulin, and hypertension.  Her most recent hemoglobin A1c was 6.6 performed on December 22, 2018.  The patient's past surgical history is remarkable for 2 prior cesarean sections.  As part of her evaluation she underwent an ultrasound scan which revealed a uterus measuring 11.9 x 5.7 x 6.2 cm.  It contained multiple submucosal, posterior myometrial, and lower uterine segment intramural fibroids the largest of which was 3.1 cm.  The endometrium measured 5 mm.  The right ovary was grossly normal at 2.6 x 2 cm.  The left ovary measured 6.6 x 5.4 x 5.8  cm and contained a complex multiloculated cystic mass measuring 5.1 x 3.9 x 5.4 cm.  The dominant loculated component contain diffuse low-level echoes.  2 small components was separated by thin internal area of septation which appeared anechoic.  Both ovaries contained normal pulse Doppler flow.  She had undergone a CT scan on December 14, 2018 this had revealed no evidence of acute abnormalities within the solid abdominal organs.  There was a  leiomyomatous uterus identified.  The left adnexa contained a multiloculated cystic structure associated with the left adnexa with somewhat thick walls measuring 5.4 cm.  The findings were consistent with either endometrioma, hemorrhagic cyst, or tubo-ovarian abscess.  Of note she has not been febrile with other symptoms consistent with infection.  A Ca1 25 was drawn on December 14, 2018 and was 47.8.  CEA drawn on December 14, 2018 was 2.9.   Current Meds:  Outpatient Encounter Medications as of 02/04/2019  Medication Sig  . atorvastatin (LIPITOR) 80 MG tablet TAKE 1 TABLET(80 MG) BY MOUTH DAILY AT 6 PM  . B-D UF III MINI PEN NEEDLES 31G X 5 MM MISC USE DAILY AS DIRECTED TO INJECT INSULIN  . clopidogrel (PLAVIX) 75 MG tablet Take 1 tablet (75 mg total) by mouth daily.  Marland Kitchen ezetimibe (ZETIA) 10 MG tablet Take 1 tablet (10 mg total) by mouth daily.  Marland Kitchen gabapentin (NEURONTIN) 300 MG capsule Take 1 capsule (300 mg total) by mouth 3 (three) times daily. For numbness.  . Insulin Glargine (LANTUS SOLOSTAR) 100 UNIT/ML Solostar Pen ADMINISTER 25 UNITS UNDER THE SKIN DAILY AT 10:00PM  . lisinopril (PRINIVIL,ZESTRIL) 10 MG tablet TAKE 1 TABLET(10 MG) BY MOUTH DAILY  . metFORMIN (GLUCOPHAGE-XR) 500 MG 24 hr tablet Take 2 tablets (1,000 mg total) by mouth daily with breakfast. For diabetes.  . metroNIDAZOLE (FLAGYL) 500 MG tablet Take 1 tablet (500 mg total) by mouth 2 (two) times daily.  . naproxen (NAPROSYN) 500 MG tablet Take 1 tablet (500 mg total) by mouth 2 (two) times daily.  . [DISCONTINUED] lisinopril (ZESTRIL) 10 MG tablet Take by mouth.  Marland Kitchen ibuprofen (ADVIL) 800 MG tablet   . morphine (MSIR) 15 MG tablet Take 1 tablet (15 mg total) by mouth every 4 (four) hours as needed for severe pain. (Patient not taking: Reported on 02/04/2019)  . omega-3 acid ethyl esters (LOVAZA) 1 g capsule Take 2 capsules (2 g total) by mouth 2 (two) times daily. (Patient not taking: Reported on 02/04/2019)  .  oxyCODONE-acetaminophen (PERCOCET/ROXICET) 5-325 MG tablet Take 1 tablet by mouth every 6 (six) hours as needed for severe pain. (Patient not taking: Reported on 02/04/2019)  . traMADol (ULTRAM) 50 MG tablet Take 2 tablets (100 mg total) by mouth every 6 (six) hours as needed.   No facility-administered encounter medications on file as of 02/04/2019.     Allergy: No Known Allergies  Social Hx:   Social History   Socioeconomic History  . Marital status: Single    Spouse name: n/a  . Number of children: 2  . Years of education: 6th grade  . Highest education level: Not on file  Occupational History  . Occupation: Warehouse manager: [employer]  . Financial resource strain: Not on file  . Food insecurity    Worry: Not on file    Inability: Not on file  . Transportation needs    Medical: Not on file    Non-medical: Not on file  Tobacco  Use  . Smoking status: Never Smoker  . Smokeless tobacco: Never Used  Substance and Sexual Activity  . Alcohol use: No  . Drug use: No  . Sexual activity: Not on file  Lifestyle  . Physical activity    Days per week: Not on file    Minutes per session: Not on file  . Stress: Not on file  Relationships  . Social Herbalist on phone: Not on file    Gets together: Not on file    Attends religious service: Not on file    Active member of club or organization: Not on file    Attends meetings of clubs or organizations: Not on file    Relationship status: Not on file  . Intimate partner violence    Fear of current or ex partner: Not on file    Emotionally abused: Not on file    Physically abused: Not on file    Forced sexual activity: Not on file  Other Topics Concern  . Not on file  Social History Narrative   From [place], though family is [place], came to the [place] in 1999. Lives with her brother and her  two children.   Patient is right-handed.   Patient not drinking any caffeine.    Past  Surgical Hx:  Past Surgical History:  Procedure Laterality Date  . CESAREAN SECTION      Past Medical Hx:  Past Medical History:  Diagnosis Date  . Acute ischemic stroke ([place]) 01/31/2013  . Diabetes mellitus without complication ([place])   . Hypercholesteremia   . Hypertension     Past Gynecological History:  C/s x 2, fibroid uterus No LMP recorded.  Family Hx:  Family History  Problem Relation Age of Onset  . Stroke Mother   . Hypertension Mother   . Hyperlipidemia Mother   . Stroke Father   . Hypertension Father   . Hyperlipidemia Father   . Heart disease Father   . Hyperlipidemia Brother     Review of Systems:  Constitutional  Feels in pain and generally unwell.  ENT Normal appearing ears and nares bilaterally Skin/Breast  No rash, sores, jaundice, itching, dryness Cardiovascular  No chest pain, shortness of breath, or edema  Pulmonary  No cough or wheeze.  Gastro Intestinal  No nausea, vomitting, or diarrhoea. No bright red blood per rectum, no abdominal pain, change in bowel movement, or constipation.  Genito Urinary  No frequency, urgency, dysuria, + pelvic pain, + menorrhagia Musculo Skeletal  No myalgia, arthralgia, joint swelling or pain  Neurologic  No weakness, numbness, change in gait,  Psychology  No depression, anxiety, insomnia.   Vitals:  Blood pressure 136/83, pulse (!) 114, temperature 98.5 F (36.9 C), temperature source Temporal, resp. rate 17, height 4' 11.06" (1.5 m), weight 172 lb 3.2 oz (78.1 kg), SpO2 99 %.  Physical Exam: WD in NAD Neck  Supple NROM, without any enlargements.  Lymph Node Survey No cervical supraclavicular or inguinal adenopathy Cardiovascular  Pulse normal rate, regularity and rhythm. S1 and S2 normal.  Lungs  Clear to auscultation bilateraly, without wheezes/crackles/rhonchi. Good air movement.  Skin  No rash/lesions/breakdown  Psychiatry  Alert and oriented to person, place, and time  Abdomen  Normoactive  bowel sounds, abdomen soft, non-tender and obese without evidence of hernia.  Back No CVA tenderness Genito Urinary  Vulva/vagina: Normal external female genitalia.  No lesions. No discharge or bleeding.  Bladder/urethra:  No lesions or masses, well supported bladder  Vagina: normal  Cervix: Normal appearing, no lesions.  Uterus: anterverted, 12+cm, mobile, no parametrial involvement or nodularity.  Adnexa: no discrete masses. Rectal  Good tone, no masses no cul de sac nodularity.  Extremities  No bilateral cyanosis, clubbing or edema.   Thereasa Solo, MD  02/04/2019, 11:09 AM

## 2019-02-04 NOTE — Progress Notes (Signed)
Consult Note: Gyn-Onc  Consult was requested by Dr. Marcelo Baldy for the evaluation of [name] 43 y.o. female  CC:  Chief Complaint  Patient presents with  . Left ovarian cyst  . Elevated CA-125    Assessment/Plan:  Lisa. [name]  is a 43 y.o.  year old with what is likely an endometrioma versus a hemorrhagic cyst or tubo-ovarian abscess. She additionally has a fibroid uterus.   I reviewed her CT scan images with the patient.  I discussed my differential diagnosis.  I explained that I have a low suspicion for malignancy despite the mild elevation in Ca1 25, as this is a common finding in premenopausal patients with complex benign pelvic pathology.  I offered her either hysterectomy with LSO versus LSO.  I explained that she would continue to have heavy periods if we performed LSO alone.  I explained that hysterectomy would be associated with an increased risk for complications and prolonged recovery.  However it would be a more definitive procedure for her gynecologic complaints.  Understanding the longer recovery involved with hysterectomy resulted in the patient declining hysterectomy electively.  I explained that in the setting of a finding of malignancy we would need to proceed with hysterectomy as part of the staging procedure.  We will offer her a robotic assisted left salpingo-oophorectomy.  I explained risks of the procedure.  She expressed expressed that she was upset of having to wait 2 weeks for her surgery due to the pain.  I prescribed her 20 tablets of tramadol but explained why I could not prescribe prolonged opioid analgesics due to the concern for risk of development of addiction.  I explained that postoperatively she will be prescribed 10 tablets for postoperative use of an opioid analgesic but with no refills.  I explained the surgical procedure, and the anticipated recovery.  I am anticipating a more complex dissection due to the thick-walled appearance of the cystic  mass.  We will check a CBC today to evaluate for elevated white blood cell count, because if this is elevated, it suggest she may have a tubo-ovarian abscess and she would require preoperative antibiotics to decrease the risk of perioperative complications including sepsis and pelvic abscess.   HPI: [name] is a 43 year old P2 who is seen in consultation at the request of Dr Odie Sera for evaluation of a complex left ovarian cyst and fibroid uterus.  The patient reports a 45-monthhistory of left pelvic pain that is constant.  She has been seen in the ER twice for this pain and has been prescribed Percocet.  She reports the pain is constant.  It is not related to her menstrual period additionally she has a history of menorrhagia associated with a known fibroid uterus.  Medical history is significant for obesity with a BMI of 35 kg per metered squared, in addition to type 2 diabetes mellitus for which she takes insulin, and hypertension.  Her most recent hemoglobin A1c was 6.6 performed on December 22, 2018.  The patient's past surgical history is remarkable for 2 prior cesarean sections.  As part of her evaluation she underwent an ultrasound scan which revealed a uterus measuring 11.9 x 5.7 x 6.2 cm.  It contained multiple submucosal, posterior myometrial, and lower uterine segment intramural fibroids the largest of which was 3.1 cm.  The endometrium measured 5 mm.  The right ovary was grossly normal at 2.6 x 2 cm.  The left ovary measured 6.6 x 5.4 x 5.8  cm and contained a complex multiloculated cystic mass measuring 5.1 x 3.9 x 5.4 cm.  The dominant loculated component contain diffuse low-level echoes.  2 small components was separated by thin internal area of septation which appeared anechoic.  Both ovaries contained normal pulse Doppler flow.  She had undergone a CT scan on December 14, 2018 this had revealed no evidence of acute abnormalities within the solid abdominal organs.  There was a  leiomyomatous uterus identified.  The left adnexa contained a multiloculated cystic structure associated with the left adnexa with somewhat thick walls measuring 5.4 cm.  The findings were consistent with either endometrioma, hemorrhagic cyst, or tubo-ovarian abscess.  Of note she has not been febrile with other symptoms consistent with infection.  A Ca1 25 was drawn on December 14, 2018 and was 47.8.  CEA drawn on December 14, 2018 was 2.9.   Current Meds:  Outpatient Encounter Medications as of 02/04/2019  Medication Sig  . atorvastatin (LIPITOR) 80 MG tablet TAKE 1 TABLET(80 MG) BY MOUTH DAILY AT 6 PM  . B-D UF III MINI PEN NEEDLES 31G X 5 MM MISC USE DAILY AS DIRECTED TO INJECT INSULIN  . clopidogrel (PLAVIX) 75 MG tablet Take 1 tablet (75 mg total) by mouth daily.  Marland Kitchen ezetimibe (ZETIA) 10 MG tablet Take 1 tablet (10 mg total) by mouth daily.  Marland Kitchen gabapentin (NEURONTIN) 300 MG capsule Take 1 capsule (300 mg total) by mouth 3 (three) times daily. For numbness.  . Insulin Glargine (LANTUS SOLOSTAR) 100 UNIT/ML Solostar Pen ADMINISTER 25 UNITS UNDER THE SKIN DAILY AT 10:00PM  . lisinopril (PRINIVIL,ZESTRIL) 10 MG tablet TAKE 1 TABLET(10 MG) BY MOUTH DAILY  . metFORMIN (GLUCOPHAGE-XR) 500 MG 24 hr tablet Take 2 tablets (1,000 mg total) by mouth daily with breakfast. For diabetes.  . metroNIDAZOLE (FLAGYL) 500 MG tablet Take 1 tablet (500 mg total) by mouth 2 (two) times daily.  . naproxen (NAPROSYN) 500 MG tablet Take 1 tablet (500 mg total) by mouth 2 (two) times daily.  . [DISCONTINUED] lisinopril (ZESTRIL) 10 MG tablet Take by mouth.  Marland Kitchen ibuprofen (ADVIL) 800 MG tablet   . morphine (MSIR) 15 MG tablet Take 1 tablet (15 mg total) by mouth every 4 (four) hours as needed for severe pain. (Patient not taking: Reported on 02/04/2019)  . omega-3 acid ethyl esters (LOVAZA) 1 g capsule Take 2 capsules (2 g total) by mouth 2 (two) times daily. (Patient not taking: Reported on 02/04/2019)  .  oxyCODONE-acetaminophen (PERCOCET/ROXICET) 5-325 MG tablet Take 1 tablet by mouth every 6 (six) hours as needed for severe pain. (Patient not taking: Reported on 02/04/2019)  . traMADol (ULTRAM) 50 MG tablet Take 2 tablets (100 mg total) by mouth every 6 (six) hours as needed.   No facility-administered encounter medications on file as of 02/04/2019.     Allergy: No Known Allergies  Social Hx:   Social History   Socioeconomic History  . Marital status: Single    Spouse name: n/a  . Number of children: 2  . Years of education: 6th grade  . Highest education level: Not on file  Occupational History  . Occupation: Warehouse manager: [employer]  . Financial resource strain: Not on file  . Food insecurity    Worry: Not on file    Inability: Not on file  . Transportation needs    Medical: Not on file    Non-medical: Not on file  Tobacco  Use  . Smoking status: Never Smoker  . Smokeless tobacco: Never Used  Substance and Sexual Activity  . Alcohol use: No  . Drug use: No  . Sexual activity: Not on file  Lifestyle  . Physical activity    Days per week: Not on file    Minutes per session: Not on file  . Stress: Not on file  Relationships  . Social Herbalist on phone: Not on file    Gets together: Not on file    Attends religious service: Not on file    Active member of club or organization: Not on file    Attends meetings of clubs or organizations: Not on file    Relationship status: Not on file  . Intimate partner violence    Fear of current or ex partner: Not on file    Emotionally abused: Not on file    Physically abused: Not on file    Forced sexual activity: Not on file  Other Topics Concern  . Not on file  Social History Narrative   From [place], though family is [place], came to the [place] in 1999. Lives with her brother and her  two children.   Patient is right-handed.   Patient not drinking any caffeine.    Past  Surgical Hx:  Past Surgical History:  Procedure Laterality Date  . CESAREAN SECTION      Past Medical Hx:  Past Medical History:  Diagnosis Date  . Acute ischemic stroke ([place]) 01/31/2013  . Diabetes mellitus without complication ([place])   . Hypercholesteremia   . Hypertension     Past Gynecological History:  C/s x 2, fibroid uterus No LMP recorded.  Family Hx:  Family History  Problem Relation Age of Onset  . Stroke Mother   . Hypertension Mother   . Hyperlipidemia Mother   . Stroke Father   . Hypertension Father   . Hyperlipidemia Father   . Heart disease Father   . Hyperlipidemia Brother     Review of Systems:  Constitutional  Feels in pain and generally unwell.  ENT Normal appearing ears and nares bilaterally Skin/Breast  No rash, sores, jaundice, itching, dryness Cardiovascular  No chest pain, shortness of breath, or edema  Pulmonary  No cough or wheeze.  Gastro Intestinal  No nausea, vomitting, or diarrhoea. No bright red blood per rectum, no abdominal pain, change in bowel movement, or constipation.  Genito Urinary  No frequency, urgency, dysuria, + pelvic pain, + menorrhagia Musculo Skeletal  No myalgia, arthralgia, joint swelling or pain  Neurologic  No weakness, numbness, change in gait,  Psychology  No depression, anxiety, insomnia.   Vitals:  Blood pressure 136/83, pulse (!) 114, temperature 98.5 F (36.9 C), temperature source Temporal, resp. rate 17, height 4' 11.06" (1.5 m), weight 172 lb 3.2 oz (78.1 kg), SpO2 99 %.  Physical Exam: WD in NAD Neck  Supple NROM, without any enlargements.  Lymph Node Survey No cervical supraclavicular or inguinal adenopathy Cardiovascular  Pulse normal rate, regularity and rhythm. S1 and S2 normal.  Lungs  Clear to auscultation bilateraly, without wheezes/crackles/rhonchi. Good air movement.  Skin  No rash/lesions/breakdown  Psychiatry  Alert and oriented to person, place, and time  Abdomen  Normoactive  bowel sounds, abdomen soft, non-tender and obese without evidence of hernia.  Back No CVA tenderness Genito Urinary  Vulva/vagina: Normal external female genitalia.  No lesions. No discharge or bleeding.  Bladder/urethra:  No lesions or masses, well supported bladder  Vagina: normal  Cervix: Normal appearing, no lesions.  Uterus: anterverted, 12+cm, mobile, no parametrial involvement or nodularity.  Adnexa: no discrete masses. Rectal  Good tone, no masses no cul de sac nodularity.  Extremities  No bilateral cyanosis, clubbing or edema.   Thereasa Solo, MD  02/04/2019, 11:09 AM

## 2019-02-04 NOTE — Patient Instructions (Addendum)
We will check a CBC today to evaluate your white blood cell count to see if there may be an infection.  Preparing for your Surgery  Plan for surgery on February 16, 2019 with Dr. Everitt Amber at [place] will be scheduled for a robotic assisted laparoscopic left salpingo-oophorectomy, possible total laparoscopic hysterectomy with possible staging if a cancer is identified.   Pre-operative Testing -You will receive a phone call from presurgical testing at [facility] if you have not received a call already to arrange for a pre-operative testing appointment before your surgery.  This appointment normally occurs one to two weeks before your scheduled surgery.   -Bring your insurance card, copy of an advanced directive if applicable, medication list  -At that visit, you will be asked to sign a consent for a possible blood transfusion in case a transfusion becomes necessary during surgery.  The need for a blood transfusion is rare but having consent is a necessary part of your care.     -You should not be taking blood thinners or aspirin at least ten days prior to surgery unless instructed by your surgeon.  -Do not take supplements such as fish oil (omega 3), red yeast rice, tumeric before your surgery.  Day Before Surgery at [place] will be asked to take in a light diet the day before surgery.  Avoid carbonated beverages.  You will be advised to have nothing to eat or drink after midnight the evening before.    Eat a light diet the day before surgery.  Examples including soups, broths, toast, yogurt, mashed potatoes.  Things to avoid include carbonated beverages (fizzy beverages), raw fruits and raw vegetables, or beans.   If your bowels are filled with gas, your surgeon will have difficulty visualizing your pelvic organs which increases your surgical risks.  Your role in recovery Your role is to become active as soon as directed by your doctor, while still giving  yourself time to heal.  Rest when you feel tired. You will be asked to do the following in order to speed your recovery:  - Cough and breathe deeply. This helps toclear and expand your lungs and can prevent pneumonia.  - Do mild physical activity. Walking or moving your legs help your circulation and body functions return to normal. A staff member will help you when you try to walk and will provide you with simple exercises. Do not try to get up or walk alone the first time. - Actively manage your pain. Managing your pain lets you move in comfort. We will ask you to rate your pain on a scale of zero to 10. It is your responsibility to tell your doctor or nurse where and how much you hurt so your pain can be treated.  Special Considerations -If you are diabetic, you may be placed on insulin after surgery to have closer control over your blood sugars to promote healing and recovery.  This does not mean that you will be discharged on insulin.  If applicable, your oral antidiabetics will be resumed when you are tolerating a solid diet.  -Your final pathology results from surgery should be available around one week after surgery and the results will be relayed to you when available.  -FMLA forms can be faxed to [phone] and please allow 5-7 business days for completion.  Pain Management After Surgery  -Make sure that you have Tylenol and Ibuprofen at home to use on a regular basis after surgery for  pain control. We recommend alternating the medications every hour to six hours since they work differently and are processed in the body differently for pain relief.  -Review the attached handout on narcotic use and their risks and side effects.   Bowel Regimen -You will be prescribed Sennakot-S to take nightly to prevent constipation especially if you are taking the narcotic pain medication intermittently.  It is important to prevent constipation and drink adequate amounts of liquids.  Blood  Transfusion Information WHAT IS A BLOOD TRANSFUSION? A transfusion is the replacement of blood or some of its parts. Blood is made up of multiple cells which provide different functions.  Red blood cells carry oxygen and are used for blood loss replacement.  White blood cells fight against infection.  Platelets control bleeding.  Plasma helps clot blood.  Other blood products are available for specialized needs, such as hemophilia or other clotting disorders. BEFORE THE TRANSFUSION  Who gives blood for transfusions?   You may be able to donate blood to be used at a later date on yourself (autologous donation).  Relatives can be asked to donate blood. This is generally not any safer than if you have received blood from a stranger. The same precautions are taken to ensure safety when a relative's blood is donated.  Healthy volunteers who are fully evaluated to make sure their blood is safe. This is blood bank blood. Transfusion therapy is the safest it has ever been in the practice of medicine. Before blood is taken from a donor, a complete history is taken to make sure that person has no history of diseases nor engages in risky social behavior (examples are intravenous drug use or sexual activity with multiple partners). The donor's travel history is screened to minimize risk of transmitting infections, such as malaria. The donated blood is tested for signs of infectious diseases, such as HIV and hepatitis. The blood is then tested to be sure it is compatible with you in order to minimize the chance of a transfusion reaction. If you or a relative donates blood, this is often done in anticipation of surgery and is not appropriate for emergency situations. It takes many days to process the donated blood. RISKS AND COMPLICATIONS Although transfusion therapy is very safe and saves many lives, the main dangers of transfusion include:   Getting an infectious disease.  Developing a transfusion  reaction. This is an allergic reaction to something in the blood you were given. Every precaution is taken to prevent this. The decision to have a blood transfusion has been considered carefully by your caregiver before blood is given. Blood is not given unless the benefits outweigh the risks.  AFTER SURGERY INSTRUCTIONS 02/04/2019  Return to work: 4-6 weeks if applicable  Activity: 1. Be up and out of the bed during the day.  Take a nap if needed.  You may walk up steps but be careful and use the hand rail.  Stair climbing will tire you more than you think, you may need to stop part way and rest.   2. No lifting or straining for 6 weeks.  3. No driving for 1 week(s).  Do not drive if you are taking narcotic pain medicine.  4. Shower daily.  Use soap and water on your incision and pat dry; don't rub.  No tub baths until cleared by your surgeon.   5. No sexual activity and nothing in the vagina for 2 weeks. IF YOU HAVE A HYSTERECTOMY, YOU CANNOT HAVE ANYTHING IN  YOUR VAGINA FOR 8 WEEKS.  6. You may experience a small amount of clear drainage from your incisions, which is normal.  If the drainage persists or increases, please call the office.  7. IF YOU HAVE A HYSTERECTOMY: You may experience vaginal spotting after surgery or around the 6-8 week mark from surgery when the stitches at the top of the vagina begin to dissolve.  The spotting is normal but if you experience heavy bleeding, call our office.  8. Take Tylenol or ibuprofen first for pain and only use narcotic pain medication for severe pain not relieved by the Tylenol or Ibuprofen.  Monitor your Tylenol intake to a max of 4,000 mg.  Diet: 1. Low sodium Heart Healthy Diet is recommended.  2. It is safe to use a laxative, such as Miralax or Colace, if you have difficulty moving your bowels. You can take Sennakot at bedtime every evening to keep bowel movements regular and to prevent constipation.    Wound Care: 1. Keep clean and  dry.  Shower daily.  Reasons to call the Doctor:  Fever - Oral temperature greater than 100.4 degrees Fahrenheit  Foul-smelling vaginal discharge  Difficulty urinating  Nausea and vomiting  Increased pain at the site of the incision that is unrelieved with pain medicine.  Difficulty breathing with or without chest pain  New calf pain especially if only on one side  Sudden, continuing increased vaginal bleeding with or without clots.   Contacts: For questions or concerns you should contact:  Dr. Everitt Amber at [phone]  [name], NP at [phone]  After Hours: call [phone] and have the GYN Oncologist paged/contacted

## 2019-02-11 NOTE — Patient Instructions (Addendum)
YOU ARE SCHEDULED FOR A COVID TEST _Friday 02/13/2019 immediately after pre op appointment. THIS TEST MUST BE DONE BEFORE SURGERY. GO TO  [address], [place], [address] AND REMAIN IN YOUR CAR, THIS IS A DRIVE UP TEST. ONCE YOUR COVID TEST IS DONE PLEASE FOLLOW ALL THE QUARANTINE  INSTRUCTIONS GIVEN IN YOUR HANDOUT.      Your procedure is scheduled on Monday 02/16/2019   Report to [facility]   Call this number if you have problems the morning of surgery  :[phone].   OUR ADDRESS IS [place].  WE ARE LOCATED IN [facility].                                     REMEMBER:  DO NOT EAT FOOD  AFTER MIDNIGHT  On Saturday 02/14/2019 as you will be drinking liquids and following a light diet from Dr. Denman George!   Eat a light diet the day before surgery.  Examples including soups, broths, toast, yogurt, mashed potatoes.   Things to avoid include carbonated beverages (fizzy beverages), raw fruits and raw vegetables, or beans.   If your bowels are filled with gas, your surgeon will have difficulty visualizing your pelvic organs which increases your surgical risks.   CLEAR LIQUID DIET   Foods Allowed                                                                     Foods Excluded  Coffee and tea, regular and decaf                             liquids that you cannot  Plain Jell-O any favor except red or purple                                           see through such as: Fruit ices (not with fruit pulp)                                     milk, soups, orange juice  Iced Popsicles                                    All solid food                                   Cranberry, grape and apple juices Sports drinks like Gatorade Lightly seasoned clear broth or consume(fat free) Sugar, honey syrup  Sample Menu Breakfast                                Lunch  Supper Cranberry juice                     Beef broth                            Chicken broth Jell-O                                     Grape juice                           Apple juice Coffee or tea                        Jell-O                                      Popsicle                                                Coffee or tea                        Coffee or tea  _____________________________________________________________________   How to Manage Your Diabetes Before and After Surgery  Why is it important to control my blood sugar before and after surgery? . Improving blood sugar levels before and after surgery helps healing and can limit problems. . A way of improving blood sugar control is eating a healthy diet by: o  Eating less sugar and carbohydrates o  Increasing activity/exercise o  Talking with your doctor about reaching your blood sugar goals . High blood sugars (greater than 180 mg/dL) can raise your risk of infections and slow your recovery, so you will need to focus on controlling your diabetes during the weeks before surgery. . Make sure that the doctor who takes care of your diabetes knows about your planned surgery including the date and location.  How do I manage my blood sugar before surgery? . Check your blood sugar at least 4 times a day, starting 2 days before surgery, to make sure that the level is not too high or low. o Check your blood sugar the morning of your surgery when you wake up and every 2 hours until you get to the Short Stay unit. . If your blood sugar is less than 70 mg/dL, you will need to treat for low blood sugar: o Do not take insulin. o Treat a low blood sugar (less than 70 mg/dL) with  cup of clear juice (cranberry or apple), 4 glucose tablets, OR glucose gel. o Recheck blood sugar in 15 minutes after treatment (to make sure it is greater than 70 mg/dL). If your blood sugar is not greater than 70 mg/dL on recheck, call [phone] for further instructions. . Report your  blood sugar to the short stay nurse when you get to Short Stay.  . If you are admitted to the hospital after surgery: o Your blood sugar will be checked by the staff and you will probably be given insulin after surgery (instead of oral diabetes medicines) to make sure you  have good blood sugar levels. o The goal for blood sugar control after surgery is 80-180 mg/dL.   WHAT DO I DO ABOUT MY DIABETES MEDICATION?        The day before surgery, Take Metformin (Glucophage -XR) as prescribed!  . Do not take oral diabetes medicines (pills) the morning of surgery.  . THE NIGHT BEFORE SURGERY, take  12   units of    Lantus   insulin.          YOU MAY  BRUSH YOUR TEETH MORNING OF SURGERY AND RINSE YOUR MOUTH OUT, NO CHEWING GUM CANDY OR MINTS.   TAKE THESE MEDICATIONS MORNING OF SURGERY WITH A SIP OF WATER:   Gabapentin (Neurontin)  IF YOU ARE SPENDING THE NIGHT AFTER SURGERY PLEASE BRING ALL YOUR PRESCRIPTION MEDICATIONS IN THEIR ORIGINAL BOTTLES. 1 VISITOR IS ALLOWED IN WAITING ROOM ONLY DAY OF SURGERY. NO VISITOR MAY SPEND THE NIGHT. VISITOR ARE ALLOWED TO STAY UNTIL 800 PM.                                    DO NOT WEAR JEWERLY, MAKE UP, OR NAIL POLISH ON FINGERNAILS. DO NOT WEAR LOTIONS, POWDERS, PERFUMES OR DEODORANT. DO NOT SHAVE FOR 24 HOURS PRIOR TO DAY OF SURGERY.  CONTACTS, GLASSES, OR DENTURES MAY NOT BE WORN TO SURGERY.                                    [facility] IS NOT RESPONSIBLE  FOR ANY BELONGINGS.                                                                    Marland Kitchen                                                                                               [facility] - Preparing for Surgery Before surgery, you can play an important role.  Because skin is not sterile, your skin needs to be as free of germs as possible.  You can reduce the number of germs on your skin by washing with CHG (chlorahexidine gluconate) soap before surgery.  CHG is an antiseptic cleaner  which kills germs and bonds with the skin to continue killing germs even after washing. Please DO NOT use if you have an allergy to CHG or antibacterial soaps.  If your skin becomes reddened/irritated stop using the CHG and inform your nurse when you arrive at Short Stay. Do not shave (including legs and underarms) for at least 48 hours prior to the first CHG shower.  You may shave your face/neck. Please follow these instructions carefully:  1.  Shower with CHG Soap the night before surgery and the  morning  of Surgery.  2.  If you choose to wash your hair, wash your hair first as usual with your  normal  shampoo.  3.  After you shampoo, rinse your hair and body thoroughly to remove the  shampoo.                           4.  Use CHG as you would any other liquid soap.  You can apply chg directly  to the skin and wash                       Gently with a scrungie or clean washcloth.  5.  Apply the CHG Soap to your body ONLY FROM THE NECK DOWN.   Do not use on face/ open                           Wound or open sores. Avoid contact with eyes, ears mouth and genitals (private parts).                       Wash face,  Genitals (private parts) with your normal soap.             6.  Wash thoroughly, paying special attention to the area where your surgery  will be performed.  7.  Thoroughly rinse your body with warm water from the neck down.  8.  DO NOT shower/wash with your normal soap after using and rinsing off  the CHG Soap.                9.  Pat yourself dry with a clean towel.            10.  Wear clean pajamas.            11.  Place clean sheets on your bed the night of your first shower and do not  sleep with pets. Day of Surgery : Do not apply any lotions/deodorants the morning of surgery.  Please wear clean clothes to the hospital/surgery center.  FAILURE TO FOLLOW THESE INSTRUCTIONS MAY RESULT IN THE CANCELLATION OF YOUR SURGERY PATIENT SIGNATURE_________________________________  NURSE  SIGNATURE__________________________________  ________________________________________________________________________   Adam Phenix  An incentive spirometer is a tool that can help keep your lungs clear and active. This tool measures how well you are filling your lungs with each breath. Taking long deep breaths may help reverse or decrease the chance of developing breathing (pulmonary) problems (especially infection) following:  A long period of time when you are unable to move or be active. BEFORE THE PROCEDURE   If the spirometer includes an indicator to show your best effort, your nurse or respiratory therapist will set it to a desired goal.  If possible, sit up straight or lean slightly forward. Try not to slouch.  Hold the incentive spirometer in an upright position. INSTRUCTIONS FOR USE  1. Sit on the edge of your bed if possible, or sit up as far as you can in bed or on a chair. 2. Hold the incentive spirometer in an upright position. 3. Breathe out normally. 4. Place the mouthpiece in your mouth and seal your lips tightly around it. 5. Breathe in slowly and as deeply as possible, raising the piston or the ball toward the top of the column. 6. Hold your breath for 3-5 seconds or for as long as possible. Allow  the piston or ball to fall to the bottom of the column. 7. Remove the mouthpiece from your mouth and breathe out normally. 8. Rest for a few seconds and repeat Steps 1 through 7 at least 10 times every 1-2 hours when you are awake. Take your time and take a few normal breaths between deep breaths. 9. The spirometer may include an indicator to show your best effort. Use the indicator as a goal to work toward during each repetition. 10. After each set of 10 deep breaths, practice coughing to be sure your lungs are clear. If you have an incision (the cut made at the time of surgery), support your incision when coughing by placing a pillow or rolled up towels firmly  against it. Once you are able to get out of bed, walk around indoors and cough well. You may stop using the incentive spirometer when instructed by your caregiver.  RISKS AND COMPLICATIONS  Take your time so you do not get dizzy or light-headed.  If you are in pain, you may need to take or ask for pain medication before doing incentive spirometry. It is harder to take a deep breath if you are having pain. AFTER USE  Rest and breathe slowly and easily.  It can be helpful to keep track of a log of your progress. Your caregiver can provide you with a simple table to help with this. If you are using the spirometer at home, follow these instructions: [place] IF:   You are having difficultly using the spirometer.  You have trouble using the spirometer as often as instructed.  Your pain medication is not giving enough relief while using the spirometer.  You develop fever of 100.5 F (38.1 C) or higher. SEEK IMMEDIATE MEDICAL CARE IF:   You cough up bloody sputum that had not been present before.  You develop fever of 102 F (38.9 C) or greater.  You develop worsening pain at or near the incision site. MAKE SURE YOU:   Understand these instructions.  Will watch your condition.  Will get help right away if you are not doing well or get worse. Document Released: 08/06/2006 Document Revised: 06/18/2011 Document Reviewed: 10/07/2006 Exit[employer] Patient Information 2014 [employer], [place].   ________________________________________________________________________  WHAT IS A BLOOD TRANSFUSION? Blood Transfusion Information  A transfusion is the replacement of blood or some of its parts. Blood is made up of multiple cells which provide different functions.  Red blood cells carry oxygen and are used for blood loss replacement.  White blood cells fight against infection.  Platelets control bleeding.  Plasma helps clot blood.  Other blood products are available for  specialized needs, such as hemophilia or other clotting disorders. BEFORE THE TRANSFUSION  Who gives blood for transfusions?   Healthy volunteers who are fully evaluated to make sure their blood is safe. This is blood bank blood. Transfusion therapy is the safest it has ever been in the practice of medicine. Before blood is taken from a donor, a complete history is taken to make sure that person has no history of diseases nor engages in risky social behavior (examples are intravenous drug use or sexual activity with multiple partners). The donor's travel history is screened to minimize risk of transmitting infections, such as malaria. The donated blood is tested for signs of infectious diseases, such as HIV and hepatitis. The blood is then tested to be sure it is compatible with you in order to minimize the chance of a transfusion reaction.  If you or a relative donates blood, this is often done in anticipation of surgery and is not appropriate for emergency situations. It takes many days to process the donated blood. RISKS AND COMPLICATIONS Although transfusion therapy is very safe and saves many lives, the main dangers of transfusion include:   Getting an infectious disease.  Developing a transfusion reaction. This is an allergic reaction to something in the blood you were given. Every precaution is taken to prevent this. The decision to have a blood transfusion has been considered carefully by your caregiver before blood is given. Blood is not given unless the benefits outweigh the risks. AFTER THE TRANSFUSION  Right after receiving a blood transfusion, you will usually feel much better and more energetic. This is especially true if your red blood cells have gotten low (anemic). The transfusion raises the level of the red blood cells which carry oxygen, and this usually causes an energy increase.  The nurse administering the transfusion will monitor you carefully for complications. HOME CARE  INSTRUCTIONS  No special instructions are needed after a transfusion. You may find your energy is better. Speak with your caregiver about any limitations on activity for underlying diseases you may have. SEEK MEDICAL CARE IF:   Your condition is not improving after your transfusion.  You develop redness or irritation at the intravenous (IV) site. SEEK IMMEDIATE MEDICAL CARE IF:  Any of the following symptoms occur over the next 12 hours:  Shaking chills.  You have a temperature by mouth above 102 F (38.9 C), not controlled by medicine.  Chest, back, or muscle pain.  People around you feel you are not acting correctly or are confused.  Shortness of breath or difficulty breathing.  Dizziness and fainting.  You get a rash or develop hives.  You have a decrease in urine output.  Your urine turns a dark color or changes to pink, red, or brown. Any of the following symptoms occur over the next 10 days:  You have a temperature by mouth above 102 F (38.9 C), not controlled by medicine.  Shortness of breath.  Weakness after normal activity.  The white part of the eye turns yellow (jaundice).  You have a decrease in the amount of urine or are urinating less often.  Your urine turns a dark color or changes to pink, red, or brown. Document Released: 03/23/2000 Document Revised: 06/18/2011 Document Reviewed: 11/10/2007 [facility] Patient Information 2014 [place], [place].  _______________________________________________________________________

## 2019-02-12 ENCOUNTER — Encounter: Payer: BC Managed Care – PPO | Admitting: Obstetrics & Gynecology

## 2019-02-13 ENCOUNTER — Encounter (HOSPITAL_COMMUNITY): Payer: Self-pay

## 2019-02-13 ENCOUNTER — Other Ambulatory Visit: Payer: Self-pay

## 2019-02-13 ENCOUNTER — Encounter (HOSPITAL_COMMUNITY)
Admission: RE | Admit: 2019-02-13 | Discharge: 2019-02-13 | Disposition: A | Discharge status: Home / Self Care | Payer: BC Managed Care – PPO | Source: Ambulatory Visit | Attending: Gynecologic Oncology | Admitting: Gynecologic Oncology

## 2019-02-13 ENCOUNTER — Other Ambulatory Visit (HOSPITAL_COMMUNITY)
Admission: RE | Admit: 2019-02-13 | Discharge: 2019-02-13 | Disposition: A | Discharge status: Home / Self Care | Payer: BC Managed Care – PPO | Source: Ambulatory Visit | Attending: Gynecologic Oncology | Admitting: Gynecologic Oncology

## 2019-02-13 ENCOUNTER — Telehealth: Payer: Self-pay

## 2019-02-13 DIAGNOSIS — Z01812 Encounter for preprocedural laboratory examination: Secondary | ICD-10-CM | POA: Insufficient documentation

## 2019-02-13 DIAGNOSIS — Z794 Long term (current) use of insulin: Secondary | ICD-10-CM | POA: Diagnosis not present

## 2019-02-13 DIAGNOSIS — D251 Intramural leiomyoma of uterus: Secondary | ICD-10-CM | POA: Diagnosis not present

## 2019-02-13 DIAGNOSIS — N801 Endometriosis of ovary: Secondary | ICD-10-CM | POA: Diagnosis not present

## 2019-02-13 DIAGNOSIS — Z8349 Family history of other endocrine, nutritional and metabolic diseases: Secondary | ICD-10-CM | POA: Diagnosis not present

## 2019-02-13 DIAGNOSIS — Z7902 Long term (current) use of antithrombotics/antiplatelets: Secondary | ICD-10-CM | POA: Diagnosis not present

## 2019-02-13 DIAGNOSIS — Z20828 Contact with and (suspected) exposure to other viral communicable diseases: Secondary | ICD-10-CM | POA: Insufficient documentation

## 2019-02-13 DIAGNOSIS — I10 Essential (primary) hypertension: Secondary | ICD-10-CM | POA: Diagnosis not present

## 2019-02-13 DIAGNOSIS — Z8673 Personal history of transient ischemic attack (TIA), and cerebral infarction without residual deficits: Secondary | ICD-10-CM | POA: Diagnosis not present

## 2019-02-13 DIAGNOSIS — E669 Obesity, unspecified: Secondary | ICD-10-CM | POA: Diagnosis not present

## 2019-02-13 DIAGNOSIS — N390 Urinary tract infection, site not specified: Secondary | ICD-10-CM

## 2019-02-13 DIAGNOSIS — Z791 Long term (current) use of non-steroidal anti-inflammatories (NSAID): Secondary | ICD-10-CM | POA: Diagnosis not present

## 2019-02-13 DIAGNOSIS — Z823 Family history of stroke: Secondary | ICD-10-CM | POA: Diagnosis not present

## 2019-02-13 DIAGNOSIS — N83202 Unspecified ovarian cyst, left side: Secondary | ICD-10-CM | POA: Insufficient documentation

## 2019-02-13 DIAGNOSIS — E119 Type 2 diabetes mellitus without complications: Secondary | ICD-10-CM | POA: Diagnosis not present

## 2019-02-13 DIAGNOSIS — R971 Elevated cancer antigen 125 [CA 125]: Secondary | ICD-10-CM | POA: Diagnosis not present

## 2019-02-13 DIAGNOSIS — E7800 Pure hypercholesterolemia, unspecified: Secondary | ICD-10-CM | POA: Diagnosis not present

## 2019-02-13 DIAGNOSIS — D250 Submucous leiomyoma of uterus: Secondary | ICD-10-CM | POA: Diagnosis not present

## 2019-02-13 DIAGNOSIS — Z79899 Other long term (current) drug therapy: Secondary | ICD-10-CM | POA: Diagnosis not present

## 2019-02-13 DIAGNOSIS — N920 Excessive and frequent menstruation with regular cycle: Secondary | ICD-10-CM | POA: Diagnosis not present

## 2019-02-13 DIAGNOSIS — Z6835 Body mass index (BMI) 35.0-35.9, adult: Secondary | ICD-10-CM | POA: Diagnosis not present

## 2019-02-13 DIAGNOSIS — Z8249 Family history of ischemic heart disease and other diseases of the circulatory system: Secondary | ICD-10-CM | POA: Diagnosis not present

## 2019-02-13 HISTORY — DX: Inflammatory liver disease, unspecified: K75.9

## 2019-02-13 HISTORY — DX: Unspecified ovarian cyst, unspecified side: N83.209

## 2019-02-13 LAB — URINALYSIS, ROUTINE W REFLEX MICROSCOPIC
Bilirubin Urine: NEGATIVE
Glucose, UA: 150 mg/dL — AB
Ketones, ur: NEGATIVE mg/dL
Leukocytes,Ua: NEGATIVE
Nitrite: POSITIVE — AB
Protein, ur: >=300 mg/dL — AB
RBC / HPF: >50 RBC/hpf — ABNORMAL HIGH (ref 0–5)
Specific Gravity, Urine: 1.026 (ref 1.005–1.030)
pH: 6 (ref 5.0–8.0)

## 2019-02-13 LAB — CBC
HCT: 38.8 % (ref 36.0–46.0)
Hemoglobin: 11.7 g/dL — ABNORMAL LOW (ref 12.0–15.0)
MCH: 24.1 pg — ABNORMAL LOW (ref 26.0–34.0)
MCHC: 30.2 g/dL (ref 30.0–36.0)
MCV: 80 fL (ref 80.0–100.0)
Platelets: 401 10*3/uL — ABNORMAL HIGH (ref 150–400)
RBC: 4.85 MIL/uL (ref 3.87–5.11)
RDW: 14.7 % (ref 11.5–15.5)
WBC: 7 10*3/uL (ref 4.0–10.5)
nRBC: 0 % (ref 0.0–0.2)

## 2019-02-13 LAB — COMPREHENSIVE METABOLIC PANEL
ALT: 18 U/L (ref 0–44)
AST: 18 U/L (ref 15–41)
Albumin: 4.1 g/dL (ref 3.5–5.0)
Alkaline Phosphatase: 45 U/L (ref 38–126)
Anion gap: 9 (ref 5–15)
BUN: 26 mg/dL — ABNORMAL HIGH (ref 6–20)
CO2: 25 mmol/L (ref 22–32)
Calcium: 9.4 mg/dL (ref 8.9–10.3)
Chloride: 102 mmol/L (ref 98–111)
Creatinine, Ser: 1.01 mg/dL — ABNORMAL HIGH (ref 0.44–1.00)
GFR calc Af Amer: >60 mL/min (ref >60)
GFR calc non Af Amer: >60 mL/min (ref >60)
Glucose, Bld: 213 mg/dL — ABNORMAL HIGH (ref 70–99)
Potassium: 4.4 mmol/L (ref 3.5–5.1)
Sodium: 136 mmol/L (ref 135–145)
Total Bilirubin: 0.8 mg/dL (ref 0.3–1.2)
Total Protein: 7.6 g/dL (ref 6.5–8.1)

## 2019-02-13 LAB — PREGNANCY, URINE: Preg Test, Ur: NEGATIVE

## 2019-02-13 LAB — SARS CORONAVIRUS 2 (TAT 6-24 HRS): SARS Coronavirus 2: NEGATIVE

## 2019-02-13 LAB — HEMOGLOBIN A1C
Hgb A1c MFr Bld: 6.9 % — ABNORMAL HIGH (ref 4.8–5.6)
Mean Plasma Glucose: 151.33 mg/dL

## 2019-02-13 LAB — ABO/RH: ABO/RH(D): O POS

## 2019-02-13 LAB — GLUCOSE, CAPILLARY: Glucose-Capillary: 210 mg/dL — ABNORMAL HIGH (ref 70–99)

## 2019-02-13 MED ORDER — NITROFURANTOIN MONOHYD MACRO 100 MG PO CAPS: 100.0000 mg | ORAL_CAPSULE | Freq: Two times a day (BID) | ORAL | 0 refills | Status: DC

## 2019-02-13 NOTE — Progress Notes (Signed)
CMP and UA results 02/13/2019 sent to Dr. Denman George and Jerilynn Mages. Cross NP via epic.

## 2019-02-13 NOTE — Telephone Encounter (Signed)
Able to discuss BS and diet for the weekend with brother [name]. Discussed UTI and picking up ATB. [name] thinks [name] stopped her Plavix ~2 weeks ago when she was told that she may have surgery. He is at work right now . He will be home tomorrow morning with sister. He will have the list of meds to review and verify with sister. Notified [name], NP who will f/u with [name] tomorrow to verify If Plavix has been held.

## 2019-02-13 NOTE — Progress Notes (Signed)
SPOKE W/  [name]     SCREENING SYMPTOMS OF COVID 19:   COUGH--No  RUNNY NOSE--- No  SORE THROAT---No  NASAL CONGESTION----No  SNEEZING----No  SHORTNESS OF BREATH---No  DIFFICULTY BREATHING---No  TEMP >100.0 -----No  UNEXPLAINED BODY ACHES------No  CHILLS -------- No  HEADACHES ---------No  LOSS OF SMELL/ TASTE --------No    HAVE YOU OR ANY FAMILY MEMBER TRAVELLED PAST 14 DAYS OUT OF THE   COUNTY---No STATE----No COUNTRY----No  HAVE YOU OR ANY FAMILY MEMBER BEEN EXPOSED TO ANYONE WITH COVID 19? No

## 2019-02-13 NOTE — Telephone Encounter (Signed)
LM for patient to discuss labs as she needs Macrobid 100 mg Bid x 5 days to begin prior to her surgery 02-16-19. Her BS was also high and needs to discuss diet for the weekend as her surgery may be cancelled due to her BS being too high to operate.

## 2019-02-13 NOTE — Progress Notes (Signed)
PCP - Harrison Mons P.A.  Cardiologist - N/A  Chest x-ray -  N/A EKG - 01/12/2019 in epic Stress Test -  N/A ECHO - greater than 2 years ago Cardiac Cath -  N/A  Sleep Study -  N/A CPAP -  N/A  Fasting Blood Sugar - 89 Checks Blood Sugar -once daily  Blood Thinner Instructions:  N/A Aspirin Instructions: N/A Last Dose: N/A  Anesthesia review: Stroke right side weakness, DM, HTN  Patient denies shortness of breath, fever, cough and chest pain at PAT appointment   Patient verbalized understanding of instructions that were given to them at the PAT appointment. Patient was also instructed that they will need to review over the PAT instructions again at home before surgery.

## 2019-02-13 NOTE — Telephone Encounter (Signed)
LM for patient to call the office regarding BS being high on pre op labs today and that she has a UTI that needs to be treated. Macrobid sent into pharmacy listed in EMR-[facility]. Please call backt o discuss.

## 2019-02-14 LAB — URINE CULTURE: Culture: 40000 — AB

## 2019-02-14 LAB — CA 125: Cancer Antigen (CA) 125: 104 U/mL — ABNORMAL HIGH (ref 0.0–38.1)

## 2019-02-16 ENCOUNTER — Ambulatory Visit (HOSPITAL_BASED_OUTPATIENT_CLINIC_OR_DEPARTMENT_OTHER): Payer: BC Managed Care – PPO | Admitting: Anesthesiology

## 2019-02-16 ENCOUNTER — Ambulatory Visit (HOSPITAL_BASED_OUTPATIENT_CLINIC_OR_DEPARTMENT_OTHER)
Admission: RE | Admit: 2019-02-16 | Discharge: 2019-02-16 | Disposition: A | Discharge status: Home / Self Care | Payer: BC Managed Care – PPO | Attending: Gynecologic Oncology | Admitting: Gynecologic Oncology

## 2019-02-16 ENCOUNTER — Encounter (HOSPITAL_BASED_OUTPATIENT_CLINIC_OR_DEPARTMENT_OTHER): Payer: Self-pay | Admitting: Gynecologic Oncology

## 2019-02-16 ENCOUNTER — Telehealth: Payer: Self-pay | Admitting: Gynecologic Oncology

## 2019-02-16 ENCOUNTER — Encounter (HOSPITAL_BASED_OUTPATIENT_CLINIC_OR_DEPARTMENT_OTHER)
Admission: RE | Disposition: A | Discharge status: Home / Self Care | Payer: Self-pay | Source: Home / Self Care | Attending: Gynecologic Oncology

## 2019-02-16 DIAGNOSIS — N801 Endometriosis of ovary: Secondary | ICD-10-CM | POA: Diagnosis not present

## 2019-02-16 DIAGNOSIS — Z79899 Other long term (current) drug therapy: Secondary | ICD-10-CM | POA: Diagnosis not present

## 2019-02-16 DIAGNOSIS — Z6835 Body mass index (BMI) 35.0-35.9, adult: Secondary | ICD-10-CM | POA: Insufficient documentation

## 2019-02-16 DIAGNOSIS — Z8673 Personal history of transient ischemic attack (TIA), and cerebral infarction without residual deficits: Secondary | ICD-10-CM | POA: Insufficient documentation

## 2019-02-16 DIAGNOSIS — E119 Type 2 diabetes mellitus without complications: Secondary | ICD-10-CM | POA: Diagnosis not present

## 2019-02-16 DIAGNOSIS — Z8249 Family history of ischemic heart disease and other diseases of the circulatory system: Secondary | ICD-10-CM | POA: Insufficient documentation

## 2019-02-16 DIAGNOSIS — E7800 Pure hypercholesterolemia, unspecified: Secondary | ICD-10-CM | POA: Insufficient documentation

## 2019-02-16 DIAGNOSIS — I10 Essential (primary) hypertension: Secondary | ICD-10-CM | POA: Diagnosis not present

## 2019-02-16 DIAGNOSIS — N83202 Unspecified ovarian cyst, left side: Secondary | ICD-10-CM | POA: Diagnosis present

## 2019-02-16 DIAGNOSIS — E669 Obesity, unspecified: Secondary | ICD-10-CM | POA: Diagnosis not present

## 2019-02-16 DIAGNOSIS — Z8349 Family history of other endocrine, nutritional and metabolic diseases: Secondary | ICD-10-CM | POA: Insufficient documentation

## 2019-02-16 DIAGNOSIS — N736 Female pelvic peritoneal adhesions (postinfective): Secondary | ICD-10-CM | POA: Diagnosis not present

## 2019-02-16 DIAGNOSIS — N920 Excessive and frequent menstruation with regular cycle: Secondary | ICD-10-CM | POA: Diagnosis not present

## 2019-02-16 DIAGNOSIS — N838 Other noninflammatory disorders of ovary, fallopian tube and broad ligament: Secondary | ICD-10-CM | POA: Diagnosis not present

## 2019-02-16 DIAGNOSIS — D250 Submucous leiomyoma of uterus: Secondary | ICD-10-CM

## 2019-02-16 DIAGNOSIS — Z791 Long term (current) use of non-steroidal anti-inflammatories (NSAID): Secondary | ICD-10-CM | POA: Diagnosis not present

## 2019-02-16 DIAGNOSIS — Z7902 Long term (current) use of antithrombotics/antiplatelets: Secondary | ICD-10-CM | POA: Diagnosis not present

## 2019-02-16 DIAGNOSIS — R971 Elevated cancer antigen 125 [CA 125]: Secondary | ICD-10-CM | POA: Insufficient documentation

## 2019-02-16 DIAGNOSIS — D251 Intramural leiomyoma of uterus: Secondary | ICD-10-CM | POA: Diagnosis not present

## 2019-02-16 DIAGNOSIS — Z794 Long term (current) use of insulin: Secondary | ICD-10-CM | POA: Insufficient documentation

## 2019-02-16 DIAGNOSIS — Z6833 Body mass index (BMI) 33.0-33.9, adult: Secondary | ICD-10-CM

## 2019-02-16 DIAGNOSIS — N803 Endometriosis of pelvic peritoneum: Secondary | ICD-10-CM | POA: Diagnosis not present

## 2019-02-16 DIAGNOSIS — Z823 Family history of stroke: Secondary | ICD-10-CM | POA: Insufficient documentation

## 2019-02-16 DIAGNOSIS — D259 Leiomyoma of uterus, unspecified: Secondary | ICD-10-CM | POA: Diagnosis not present

## 2019-02-16 HISTORY — PX: ROBOTIC ASSISTED BILATERAL SALPINGO OOPHERECTOMY: SHX6078

## 2019-02-16 LAB — GLUCOSE, CAPILLARY
Glucose-Capillary: 124 mg/dL — ABNORMAL HIGH (ref 70–99)
Glucose-Capillary: 192 mg/dL — ABNORMAL HIGH (ref 70–99)

## 2019-02-16 LAB — TYPE AND SCREEN
ABO/RH(D): O POS
Antibody Screen: NEGATIVE

## 2019-02-16 SURGERY — SALPINGO-OOPHORECTOMY, BILATERAL, ROBOT-ASSISTED
Anesthesia: General | Site: Abdomen | Laterality: Left

## 2019-02-16 MED ORDER — MIDAZOLAM HCL 2 MG/2ML IJ SOLN
INTRAMUSCULAR | Status: AC
  Filled 2019-02-16: qty 2

## 2019-02-16 MED ORDER — LACTATED RINGERS IV SOLN
INTRAVENOUS | Status: DC
  Administered 2019-02-16 (×3): via INTRAVENOUS
  Filled 2019-02-16: qty 1000

## 2019-02-16 MED ORDER — ONDANSETRON HCL 4 MG/2ML IJ SOLN
INTRAMUSCULAR | Status: AC
  Filled 2019-02-16: qty 2

## 2019-02-16 MED ORDER — OXYCODONE HCL 5 MG/5ML PO SOLN
5.0000 mg | Freq: Once | ORAL | Status: AC | PRN
  Filled 2019-02-16: qty 5

## 2019-02-16 MED ORDER — CEFAZOLIN SODIUM-DEXTROSE 2-4 GM/100ML-% IV SOLN
INTRAVENOUS | Status: AC
  Filled 2019-02-16: qty 100

## 2019-02-16 MED ORDER — ACETAMINOPHEN 500 MG PO TABS
1000.0000 mg | ORAL_TABLET | ORAL | Status: AC
  Administered 2019-02-16: 1000 mg via ORAL
  Filled 2019-02-16: qty 2

## 2019-02-16 MED ORDER — ONDANSETRON HCL 4 MG/2ML IJ SOLN
INTRAMUSCULAR | Status: DC | PRN
  Administered 2019-02-16: 4 mg via INTRAVENOUS

## 2019-02-16 MED ORDER — SCOPOLAMINE 1 MG/3DAYS TD PT72
1.0000 | MEDICATED_PATCH | TRANSDERMAL | Status: DC
  Administered 2019-02-16: 1.5 mg via TRANSDERMAL
  Filled 2019-02-16: qty 1

## 2019-02-16 MED ORDER — OXYMETAZOLINE HCL 0.05 % NA SOLN
NASAL | Status: DC | PRN
  Administered 2019-02-16 (×2): 3 via NASAL

## 2019-02-16 MED ORDER — KETOROLAC TROMETHAMINE 30 MG/ML IJ SOLN
30.0000 mg | Freq: Once | INTRAMUSCULAR | Status: AC | PRN
  Administered 2019-02-16: 30 mg via INTRAVENOUS
  Filled 2019-02-16: qty 1

## 2019-02-16 MED ORDER — CELECOXIB 200 MG PO CAPS
ORAL_CAPSULE | ORAL | Status: AC
  Filled 2019-02-16: qty 1

## 2019-02-16 MED ORDER — OXYCODONE HCL 5 MG PO TABS
5.0000 mg | ORAL_TABLET | Freq: Once | ORAL | Status: AC | PRN
  Administered 2019-02-16: 5 mg via ORAL
  Filled 2019-02-16: qty 1

## 2019-02-16 MED ORDER — CEFAZOLIN SODIUM-DEXTROSE 2-4 GM/100ML-% IV SOLN
2.0000 g | INTRAVENOUS | Status: AC
  Administered 2019-02-16: 13:00:00 2 g via INTRAVENOUS
  Filled 2019-02-16: qty 100

## 2019-02-16 MED ORDER — PROPOFOL 10 MG/ML IV BOLUS
INTRAVENOUS | Status: AC
  Filled 2019-02-16: qty 20

## 2019-02-16 MED ORDER — ROCURONIUM BROMIDE 10 MG/ML (PF) SYRINGE
PREFILLED_SYRINGE | INTRAVENOUS | Status: DC | PRN
  Administered 2019-02-16: 50 mg via INTRAVENOUS
  Administered 2019-02-16: 10 mg via INTRAVENOUS

## 2019-02-16 MED ORDER — DEXAMETHASONE SODIUM PHOSPHATE 4 MG/ML IJ SOLN
4.0000 mg | INTRAMUSCULAR | Status: AC
  Administered 2019-02-16: 4 mg via INTRAVENOUS
  Filled 2019-02-16: qty 1

## 2019-02-16 MED ORDER — CELECOXIB 400 MG PO CAPS
400.0000 mg | ORAL_CAPSULE | ORAL | Status: AC
  Administered 2019-02-16: 400 mg via ORAL
  Filled 2019-02-16: qty 1

## 2019-02-16 MED ORDER — IBUPROFEN 800 MG PO TABS: 800.0000 mg | ORAL_TABLET | Freq: Three times a day (TID) | ORAL | 0 refills | Status: AC | PRN

## 2019-02-16 MED ORDER — SENNOSIDES-DOCUSATE SODIUM 8.6-50 MG PO TABS: 2.0000 | ORAL_TABLET | Freq: Every day | ORAL | 1 refills | Status: AC

## 2019-02-16 MED ORDER — FENTANYL CITRATE (PF) 250 MCG/5ML IJ SOLN
INTRAMUSCULAR | Status: AC
  Filled 2019-02-16: qty 5

## 2019-02-16 MED ORDER — PHENYLEPHRINE 40 MCG/ML (10ML) SYRINGE FOR IV PUSH (FOR BLOOD PRESSURE SUPPORT)
PREFILLED_SYRINGE | INTRAVENOUS | Status: DC | PRN
  Administered 2019-02-16 (×2): 80 ug via INTRAVENOUS

## 2019-02-16 MED ORDER — FERRIC SUBSULFATE SOLN
Status: DC | PRN
  Administered 2019-02-16: 1

## 2019-02-16 MED ORDER — FENTANYL CITRATE (PF) 100 MCG/2ML IJ SOLN
INTRAMUSCULAR | Status: AC
  Filled 2019-02-16: qty 2

## 2019-02-16 MED ORDER — OXYCODONE HCL 5 MG PO TABS: 5.0000 mg | ORAL_TABLET | ORAL | 0 refills | Status: AC | PRN

## 2019-02-16 MED ORDER — PHENYLEPHRINE 40 MCG/ML (10ML) SYRINGE FOR IV PUSH (FOR BLOOD PRESSURE SUPPORT)
PREFILLED_SYRINGE | INTRAVENOUS | Status: AC
  Filled 2019-02-16: qty 10

## 2019-02-16 MED ORDER — LIDOCAINE 2% (20 MG/ML) 5 ML SYRINGE
INTRAMUSCULAR | Status: AC
  Filled 2019-02-16: qty 5

## 2019-02-16 MED ORDER — PROMETHAZINE HCL 25 MG/ML IJ SOLN
6.2500 mg | INTRAMUSCULAR | Status: DC | PRN
  Filled 2019-02-16: qty 1

## 2019-02-16 MED ORDER — ROCURONIUM BROMIDE 10 MG/ML (PF) SYRINGE
PREFILLED_SYRINGE | INTRAVENOUS | Status: AC
  Filled 2019-02-16: qty 10

## 2019-02-16 MED ORDER — ACETAMINOPHEN 500 MG PO TABS
ORAL_TABLET | ORAL | Status: AC
  Filled 2019-02-16: qty 2

## 2019-02-16 MED ORDER — PROPOFOL 10 MG/ML IV BOLUS
INTRAVENOUS | Status: DC | PRN
  Administered 2019-02-16: 130 mg via INTRAVENOUS
  Administered 2019-02-16: 20 mg via INTRAVENOUS
  Administered 2019-02-16: 50 mg via INTRAVENOUS

## 2019-02-16 MED ORDER — BUPIVACAINE HCL 0.25 % IJ SOLN
INTRAMUSCULAR | Status: DC | PRN
  Administered 2019-02-16: 10 mL

## 2019-02-16 MED ORDER — FENTANYL CITRATE (PF) 100 MCG/2ML IJ SOLN
INTRAMUSCULAR | Status: DC | PRN
  Administered 2019-02-16 (×5): 50 ug via INTRAVENOUS

## 2019-02-16 MED ORDER — DEXAMETHASONE SODIUM PHOSPHATE 10 MG/ML IJ SOLN
INTRAMUSCULAR | Status: AC
  Filled 2019-02-16: qty 1

## 2019-02-16 MED ORDER — SUGAMMADEX SODIUM 200 MG/2ML IV SOLN
INTRAVENOUS | Status: DC | PRN
  Administered 2019-02-16: 200 mg via INTRAVENOUS

## 2019-02-16 MED ORDER — OXYCODONE HCL 5 MG PO TABS
ORAL_TABLET | ORAL | Status: AC
  Filled 2019-02-16: qty 1

## 2019-02-16 MED ORDER — KETOROLAC TROMETHAMINE 30 MG/ML IJ SOLN
INTRAMUSCULAR | Status: AC
  Filled 2019-02-16: qty 1

## 2019-02-16 MED ORDER — LIDOCAINE 2% (20 MG/ML) 5 ML SYRINGE
INTRAMUSCULAR | Status: DC | PRN
  Administered 2019-02-16: 80 mg via INTRAVENOUS

## 2019-02-16 MED ORDER — FENTANYL CITRATE (PF) 100 MCG/2ML IJ SOLN
25.0000 ug | INTRAMUSCULAR | Status: DC | PRN
  Administered 2019-02-16 (×4): 25 ug via INTRAVENOUS
  Filled 2019-02-16: qty 1

## 2019-02-16 MED ORDER — SUGAMMADEX SODIUM 500 MG/5ML IV SOLN
INTRAVENOUS | Status: AC
  Filled 2019-02-16: qty 5

## 2019-02-16 MED ORDER — SCOPOLAMINE 1 MG/3DAYS TD PT72
MEDICATED_PATCH | TRANSDERMAL | Status: AC
  Filled 2019-02-16: qty 1

## 2019-02-16 SURGICAL SUPPLY — 65 items
ADH SKN CLS APL DERMABOND .7 (GAUZE/BANDAGES/DRESSINGS) ×1
AGENT HMST KT MTR STRL THRMB (HEMOSTASIS) ×2
APL ESCP 34 STRL LF DISP (HEMOSTASIS) ×1
APPLICATOR SURGIFLO ENDO (HEMOSTASIS) ×1 IMPLANT
BAG LAPAROSCOPIC 12 15 PORT 16 (BASKET) IMPLANT
BAG RETRIEVAL 12/15 (BASKET)
BAG SPEC RTRVL LRG 6X4 10 (ENDOMECHANICALS) ×1
BLADE SURG 10 STRL SS (BLADE) IMPLANT
COVER BACK TABLE 60X90IN (DRAPES) ×2 IMPLANT
COVER TIP SHEARS 8 DVNC (MISCELLANEOUS) ×1 IMPLANT
COVER TIP SHEARS 8MM DA VINCI (MISCELLANEOUS) ×1
COVER WAND RF STERILE (DRAPES) ×2 IMPLANT
DECANTER SPIKE VIAL GLASS SM (MISCELLANEOUS) IMPLANT
DERMABOND ADVANCED (GAUZE/BANDAGES/DRESSINGS) ×1
DERMABOND ADVANCED .7 DNX12 (GAUZE/BANDAGES/DRESSINGS) ×1 IMPLANT
DRAPE ARM DVNC X/XI (DISPOSABLE) ×4 IMPLANT
DRAPE COLUMN DVNC XI (DISPOSABLE) ×1 IMPLANT
DRAPE DA VINCI XI ARM (DISPOSABLE) ×4
DRAPE DA VINCI XI COLUMN (DISPOSABLE) ×1
DRAPE SHEET LG 3/4 BI-LAMINATE (DRAPES) ×2 IMPLANT
DRAPE SURG IRRIG POUCH 19X23 (DRAPES) ×2 IMPLANT
ELECT REM PT RETURN 9FT ADLT (ELECTROSURGICAL) ×2
ELECTRODE REM PT RTRN 9FT ADLT (ELECTROSURGICAL) ×1 IMPLANT
GAUZE 4X4 16PLY RFD (DISPOSABLE) ×2 IMPLANT
GLOVE BIO SURGEON STRL SZ 6 (GLOVE) ×8 IMPLANT
GLOVE BIO SURGEON STRL SZ 6.5 (GLOVE) ×4 IMPLANT
GOWN STRL REUS W/TWL LRG LVL3 (GOWN DISPOSABLE) ×4 IMPLANT
HOLDER FOLEY CATH W/STRAP (MISCELLANEOUS) ×2 IMPLANT
IRRIG SUCT STRYKERFLOW 2 WTIP (MISCELLANEOUS) ×2
IRRIGATION SUCT STRKRFLW 2 WTP (MISCELLANEOUS) ×1 IMPLANT
KIT PROCEDURE DA VINCI SI (MISCELLANEOUS)
KIT PROCEDURE DVNC SI (MISCELLANEOUS) IMPLANT
KIT TURNOVER CYSTO (KITS) IMPLANT
LEGGING LITHOTOMY PAIR STRL (DRAPES) ×2 IMPLANT
MANIPULATOR ADVINCU DEL 3.5 PL (MISCELLANEOUS) ×1 IMPLANT
MANIPULATOR UTERINE 4.5 ZUMI (MISCELLANEOUS) ×2 IMPLANT
NDL SPNL 18GX3.5 QUINCKE PK (NEEDLE) IMPLANT
NEEDLE HYPO 22GX1.5 SAFETY (NEEDLE) ×2 IMPLANT
NEEDLE SPNL 18GX3.5 QUINCKE PK (NEEDLE) IMPLANT
OBTURATOR OPTICAL STANDARD 8MM (TROCAR) ×1
OBTURATOR OPTICAL STND 8 DVNC (TROCAR) ×1
OBTURATOR OPTICALSTD 8 DVNC (TROCAR) ×1 IMPLANT
PACK ROBOT GYN CUSTOM WL (TRAY / TRAY PROCEDURE) ×2 IMPLANT
PAD POSITIONING PINK XL (MISCELLANEOUS) ×2 IMPLANT
PENCIL BUTTON HOLSTER BLD 10FT (ELECTRODE) IMPLANT
PORT ACCESS TROCAR AIRSEAL 12 (TROCAR) IMPLANT
PORT ACCESS TROCAR AIRSEAL 5M (TROCAR)
POUCH SPECIMEN RETRIEVAL 10MM (ENDOMECHANICALS) ×1 IMPLANT
SCOPETTES 8  STERILE (MISCELLANEOUS) ×1
SCOPETTES 8 STERILE (MISCELLANEOUS) IMPLANT
SEAL CANN UNIV 5-8 DVNC XI (MISCELLANEOUS) ×3 IMPLANT
SEAL XI 5MM-8MM UNIVERSAL (MISCELLANEOUS) ×3
SET TRI-LUMEN FLTR TB AIRSEAL (TUBING) ×2 IMPLANT
SURGIFLO W/THROMBIN 8M KIT (HEMOSTASIS) ×2 IMPLANT
SUT VIC AB 0 CT1 36 (SUTURE) IMPLANT
SUT VIC AB 2-0 UR6 27 (SUTURE) ×1 IMPLANT
SUT VIC AB 4-0 PS2 18 (SUTURE) ×4 IMPLANT
SYR 10ML LL (SYRINGE) IMPLANT
TRAP SPECIMEN MUCOUS 40CC (MISCELLANEOUS) ×1 IMPLANT
TRAY FOLEY W/BAG SLVR 14FR (SET/KITS/TRAYS/PACK) ×2 IMPLANT
TROCAR XCEL 12X100 BLDLESS (ENDOMECHANICALS) ×2 IMPLANT
TUBE CONNECTING 12X1/4 (SUCTIONS) ×2 IMPLANT
UNDERPAD 30X30 (UNDERPADS AND DIAPERS) ×2 IMPLANT
WATER STERILE IRR 1000ML POUR (IV SOLUTION) ×2 IMPLANT
YANKAUER SUCT BULB TIP NO VENT (SUCTIONS) IMPLANT

## 2019-02-16 NOTE — Interval H&P Note (Signed)
History and Physical Interval Note:  02/16/2019 10:27 AM  [name]  has presented today for surgery, with the diagnosis of LEFT OVARIAN CYST.  The various methods of treatment have been discussed with the patient and family. After consideration of risks, benefits and other options for treatment, the patient has consented to  Procedure(s): XI ROBOTIC ASSISTED LEFT  SALPINGO OOPHORECTOMY,POSSIBLE TOTAL LAPARSCOPIC HYSTERECTOMY,POSSIBLE STAGING (Left) as a surgical intervention.  The patient's history has been reviewed, patient examined, no change in status, stable for surgery.  I have reviewed the patient's chart and labs.  Questions were answered to the patient's satisfaction.     Thereasa Solo

## 2019-02-16 NOTE — Anesthesia Procedure Notes (Signed)
Procedure Name: Intubation Date/Time: 02/16/2019 12:59 PM Performed by: Mechele Claude, CRNA Pre-anesthesia Checklist: Patient identified, Emergency Drugs available, Suction available and Patient being monitored Patient Re-evaluated:Patient Re-evaluated prior to induction Oxygen Delivery Method: Circle system utilized Preoxygenation: Pre-oxygenation with 100% oxygen Induction Type: IV induction and Cricoid Pressure applied Ventilation: Mask ventilation without difficulty Laryngoscope Size: Mac and 3 Grade View: Grade II Tube type: Oral Tube size: 7.0 mm Number of attempts: 1 Airway Equipment and Method: Stylet and Oral airway Placement Confirmation: ETT inserted through vocal cords under direct vision,  positive ETCO2 and breath sounds checked- equal and bilateral Secured at: 21 cm Tube secured with: Tape Dental Injury: Teeth and Oropharynx as per pre-operative assessment

## 2019-02-16 NOTE — Anesthesia Postprocedure Evaluation (Signed)
Anesthesia Post Note  Patient: [name]  Procedure(s) Performed: XI ROBOTIC ASSISTED LEFT  SALPINGO OOPHORECTOMY; LYSIS OF ADHESIONS (Left Abdomen)     Patient location during evaluation: PACU Anesthesia Type: General Level of consciousness: awake and alert and oriented Pain management: pain level controlled Vital Signs Assessment: post-procedure vital signs reviewed and stable Respiratory status: spontaneous breathing, nonlabored ventilation and respiratory function stable Cardiovascular status: blood pressure returned to baseline Postop Assessment: no apparent nausea or vomiting Anesthetic complications: no       Brennan Bailey

## 2019-02-16 NOTE — Transfer of Care (Signed)
Last Vitals:  Vitals Value Taken Time  BP 134/83 02/16/19 1515  Temp    Pulse 91 02/16/19 1525  Resp 16 02/16/19 1525  SpO2 100 % 02/16/19 1525  Vitals shown include unvalidated device data.  Last Pain:  Vitals:   02/16/19 1050  TempSrc: Oral  PainSc: 6       Patients Stated Pain Goal: 5 (02/16/19 1050)  Immediate Anesthesia Transfer of Care Note  Patient: [name]  Procedure(s) Performed: Procedure(s) (LRB): XI ROBOTIC ASSISTED LEFT  SALPINGO OOPHORECTOMY; LYSIS OF ADHESIONS (Left)  Patient Location: PACU  Anesthesia Type: General  Level of Consciousness: drowsy  Airway & Oxygen Therapy: Patient Spontanous Breathing and Patient connected to nasal cannula oxygen, oral airway remaining.  Post-op Assessment: Report given to PACU RN and Post -op Vital signs reviewed and stable  Post vital signs: Reviewed and stable  Complications: No apparent anesthesia complications

## 2019-02-16 NOTE — Op Note (Signed)
OPERATIVE NOTE  Date: 02/16/19  Preoperative Diagnosis: left adnexal mass, elevated CA 125   Postoperative Diagnosis:  Stage 4 endometriosis.   Procedure(s) Performed: Robotic-assisted laparoscopic left salpingo-oophorectomy, adhesiolysis   Surgeon: Everitt Amber, M.D.  Assistant Surgeon: [name], NP. (a provider assistant was necessary for tissue manipulation, management of robotic instrumentation, retraction and positioning due to the complexity of the case and hospital policies).   Anesthesia: Gen. endotracheal.  Specimens: left tube and ovary, pelvic washings  Estimated Blood Loss: 20 mL. Blood Replacement: None  Complications: none  Indication for Procedure:  Pelvic pain, elevated CA 125, 10cm left adnexal mass on CT imaging.   Operative Findings: stage 4 endometriosis with an obliterated posterior cul de sac, rectum adherent to posterior uterus, 10cm left ovarian mass densely adherent to ovarian fossa, posterior uterus and rectum.  Cyst filled with chocolate colored fluid. Normal appearing left ovary, fibroid uterus. Adhesions between omentum and anterior pelvic wall.   Frozen pathology was consistent with endometrioma  Procedure: The patient's taken to the operating room and placed under general endotracheal anesthesia testing difficulty. She is placed in a dorsolithotomy position and cervical acromial pad was placed. The arms were tucked with care taken to pad the olecranon process. And prepped and draped in usual sterile fashion. A uterine manipulator (zumi) was placed vaginally. A 49m incision was made in the left upper quadrant palmer's point and a 5 mm Optiview trocar used to enter the abdomen under direct visualization. With entry into the abdomen and then maintenance of 15 mm of mercury the patient was placed in Trendelenburg position. An incision was made in the umbilicus and a 168EHtrochar was placed through this site. Two incisions were made lateral to the umbilical  incision in the left and right abdomen measuring 854m These incisions were made approximately 10 cm lateral to the umbilical incision. 8 mm robotic trochars were inserted. The robot was docked.  The abdomen was inspected as was the pelvis.  Sharp and monopolar dissection took down the omental adhesions to the anterior abdominal wall. Pelvic washings were obtained.  For 30 minutes sharp adhesiolysis was performed to dissect the rectum and sigmoid colon from their attachments to the posterior lower uterine segment and to their dense attachments to the left ovarian mass. In doing so there was unavoidable entry into the cyst with spillage of chocolate colored fluid.   An incision was made on the left pelvic side wall peritoneum parallel to the IP ligament and the retroperitoneal space entered. The left ureter was identified and the para-rectal space was developed. A window was created in the left broad ligament above the ureter. The left infundibulopelvic vessels were skeletonized cauterized and transected. With care to visualize the ureter and mobilize it laterally, the left ovarian fossa peritoneum was dissected off of the left ovarian mass. The utero-ovarian ligaments similarly were cauterized and transected. Specimen was placed in an Endo Catch bag. The abdomen was copiously irrigated and drained and all operative sites inspected and hemostasis was assured. Surgiflow was used to reinforce hemostasis at the posterior uterus and ovarian fossa.   The robot was undocked. The contents of the left Endo Catch bag were first aspirated and then morcellated within the bag to facilitate removal from the abdominal cavity through the left upper quadrant incision. The ports were all remove. The fascial closure at the umbilical incision and left upper quadrant port was made with 0 Vicryl.  All incisions were closed with a running subcuticular Monocryl suture. Dermabond  was applied. Sponge, lap and needle counts were  correct x 3.    The vagina was sponged and there was bleeding noted from the cervix which was made hemostatic with a 2-0 vicryl suture.   The patient had sequential compression devices for VTE prophylaxis.         Disposition: PACU          Condition: stable  Donaciano Eva, MD

## 2019-02-16 NOTE — Telephone Encounter (Signed)
Called to follow up with patient's brother about the patient and taking Plavix.  He verbally spoke to her while we were on the phone and she said she had not taken it for 6 weeks.

## 2019-02-16 NOTE — Anesthesia Preprocedure Evaluation (Addendum)
Anesthesia Evaluation  Patient identified by MRN, date of birth, ID band Patient awake    Reviewed: Allergy & Precautions, NPO status , Patient's Chart, lab work & pertinent test results  History of Anesthesia Complications Negative for: history of anesthetic complications  Airway Mallampati: II  TM Distance: >3 FB Neck ROM: Full    Dental no notable dental hx.    Pulmonary neg pulmonary ROS,    Pulmonary exam normal        Cardiovascular hypertension, Pt. on medications Normal cardiovascular exam     Neuro/Psych CVA (2014, right-sided weakness), Residual Symptoms negative psych ROS   GI/Hepatic negative GI ROS, (+) Hepatitis -, C  Endo/Other  diabetes, Type 2, Oral Hypoglycemic Agents, Insulin Dependent  Renal/GU negative Renal ROS  negative genitourinary   Musculoskeletal negative musculoskeletal ROS (+)   Abdominal   Peds  Hematology negative hematology ROS (+)   Anesthesia Other Findings Day of surgery medications reviewed with patient.  Reproductive/Obstetrics negative OB ROS                            Anesthesia Physical Anesthesia Plan  ASA: III  Anesthesia Plan: General   Post-op Pain Management:    Induction: Intravenous  PONV Risk Score and Plan: 4 or greater and Midazolam, Treatment may vary due to age or medical condition, Ondansetron, Dexamethasone and Scopolamine patch - Pre-op  Airway Management Planned: Oral ETT  Additional Equipment: None  Intra-op Plan:   Post-operative Plan: Extubation in OR  Informed Consent: I have reviewed the patients History and Physical, chart, labs and discussed the procedure including the risks, benefits and alternatives for the proposed anesthesia with the patient or authorized representative who has indicated his/her understanding and acceptance.     Dental advisory given  Plan Discussed with: CRNA  Anesthesia Plan Comments:         Anesthesia Quick Evaluation

## 2019-02-16 NOTE — Discharge Instructions (Addendum)
02/16/2019  Return to work: 4-6 weeks if applicable  STAY OFF THE PLAVIX (CLOPIDOGREL) FOR ONE WEEK AFTER SURGERY   Activity: 1. Be up and out of the bed during the day.  Take a nap if needed.  You may walk up steps but be careful and use the hand rail.  Stair climbing will tire you more than you think, you may need to stop part way and rest.   2. No lifting or straining for 6 weeks.  3. No driving for 1 week(s).  Do not drive if you are taking narcotic pain medicine.  4. Shower daily.  Use soap and water on your incision and pat dry; don't rub.  No tub baths until cleared by your surgeon.   5. No sexual activity and nothing in the vagina for 2 weeks.  6. You may experience a small amount of clear drainage from your incisions, which is normal.  If the drainage persists or increases, please call the office.  7. Take Tylenol or ibuprofen first for pain and only use narcotic pain medication for severe pain not relieved by the Tylenol or Ibuprofen.  Monitor your Tylenol intake to a max of 4,000 mg.  Diet: 1. Low sodium Heart Healthy Diet is recommended.  2. It is safe to use a laxative, such as Miralax or Colace, if you have difficulty moving your bowels. You can take Sennakot at bedtime every evening to keep bowel movements regular and to prevent constipation.    Wound Care: 1. Keep clean and dry.  Shower daily.  Reasons to call the Doctor:  Fever - Oral temperature greater than 100.4 degrees Fahrenheit  Foul-smelling vaginal discharge  Difficulty urinating  Nausea and vomiting  Increased pain at the site of the incision that is unrelieved with pain medicine.  Difficulty breathing with or without chest pain  New calf pain especially if only on one side  Sudden, continuing increased vaginal bleeding with or without clots.   Contacts: For questions or concerns you should contact:  Dr. Everitt Amber at [phone]  [name], NP at [phone]  After Hours: call  [phone] and have the GYN Oncologist paged/contacted   Post Anesthesia Home Care Instructions  Activity: Get plenty of rest for the remainder of the day. A responsible individual must stay with you for 24 hours following the procedure.  For the next 24 hours, DO NOT: -Drive a car -Paediatric nurse -Drink alcoholic beverages -Take any medication unless instructed by your physician -Make any legal decisions or sign important papers.  Meals: Start with liquid foods such as gelatin or soup. Progress to regular foods as tolerated. Avoid greasy, spicy, heavy foods. If nausea and/or vomiting occur, drink only clear liquids until the nausea and/or vomiting subsides. Call your physician if vomiting continues.  Special Instructions/Symptoms: Your throat may feel dry or sore from the anesthesia or the breathing tube placed in your throat during surgery. If this causes discomfort, gargle with warm salt water. The discomfort should disappear within 24 hours.  If you had a scopolamine patch placed behind your ear for the management of post- operative nausea and/or vomiting:  1. The medication in the patch is effective for 72 hours, after which it should be removed.  Wrap patch in a tissue and discard in the trash. Wash hands thoroughly with soap and water. 2. You may remove the patch earlier than 72 hours if you experience unpleasant side effects which may include dry mouth, dizziness or visual disturbances. 3. Avoid touching  the patch. Wash your hands with soap and water after contact with the patch.

## 2019-02-17 ENCOUNTER — Telehealth: Payer: Self-pay

## 2019-02-17 ENCOUNTER — Encounter (HOSPITAL_BASED_OUTPATIENT_CLINIC_OR_DEPARTMENT_OTHER): Payer: Self-pay | Admitting: Gynecologic Oncology

## 2019-02-17 NOTE — Telephone Encounter (Signed)
I spoke to [name]'s bother, [name] this am.  He stated that she was "good" and that she was still sleeping.  He said her son was also there and spoke [place].  I told him I would call this afternoon.  When I called no one ansered.  I left a vm to call our office.

## 2019-02-18 LAB — CYTOLOGY - NON PAP

## 2019-02-18 LAB — SURGICAL PATHOLOGY

## 2019-02-18 NOTE — Telephone Encounter (Signed)
Spoke with [name] and he said that his sister is doing well. Eating, drinking, Urinating well. She is not having any problems.  She is up and about.

## 2019-02-25 ENCOUNTER — Other Ambulatory Visit: Payer: Self-pay

## 2019-02-25 ENCOUNTER — Ambulatory Visit (HOSPITAL_COMMUNITY)
Admission: RE | Admit: 2019-02-25 | Discharge: 2019-02-25 | Disposition: A | Discharge status: Home / Self Care | Payer: BC Managed Care – PPO | Source: Ambulatory Visit | Attending: Gynecologic Oncology | Admitting: Gynecologic Oncology

## 2019-02-25 ENCOUNTER — Inpatient Hospital Stay: Payer: BC Managed Care – PPO

## 2019-02-25 ENCOUNTER — Telehealth: Payer: Self-pay

## 2019-02-25 ENCOUNTER — Other Ambulatory Visit: Payer: Self-pay | Admitting: Gynecologic Oncology

## 2019-02-25 ENCOUNTER — Inpatient Hospital Stay: Payer: BC Managed Care – PPO | Attending: Gynecologic Oncology

## 2019-02-25 VITALS — BP 126/82 | HR 86 | Temp 98.0°F | Resp 18 | Ht 63.0 in

## 2019-02-25 DIAGNOSIS — R109 Unspecified abdominal pain: Secondary | ICD-10-CM | POA: Diagnosis not present

## 2019-02-25 DIAGNOSIS — R1032 Left lower quadrant pain: Secondary | ICD-10-CM

## 2019-02-25 LAB — BASIC METABOLIC PANEL
Anion gap: 12 (ref 5–15)
BUN: 9 mg/dL (ref 6–20)
CO2: 22 mmol/L (ref 22–32)
Calcium: 9.6 mg/dL (ref 8.9–10.3)
Chloride: 106 mmol/L (ref 98–111)
Creatinine, Ser: 0.74 mg/dL (ref 0.44–1.00)
GFR calc Af Amer: >60 mL/min (ref >60)
GFR calc non Af Amer: >60 mL/min (ref >60)
Glucose, Bld: 131 mg/dL — ABNORMAL HIGH (ref 70–99)
Potassium: 4.4 mmol/L (ref 3.5–5.1)
Sodium: 140 mmol/L (ref 135–145)

## 2019-02-25 LAB — CBC WITH DIFFERENTIAL (CANCER CENTER ONLY)
Abs Immature Granulocytes: 0.06 10*3/uL (ref 0.00–0.07)
Basophils Absolute: 0 10*3/uL (ref 0.0–0.1)
Basophils Relative: 0 %
Eosinophils Absolute: 0.1 10*3/uL (ref 0.0–0.5)
Eosinophils Relative: 1 %
HCT: 32.5 % — ABNORMAL LOW (ref 36.0–46.0)
Hemoglobin: 10.3 g/dL — ABNORMAL LOW (ref 12.0–15.0)
Immature Granulocytes: 1 %
Lymphocytes Relative: 12 %
Lymphs Abs: 1 10*3/uL (ref 0.7–4.0)
MCH: 24.1 pg — ABNORMAL LOW (ref 26.0–34.0)
MCHC: 31.7 g/dL (ref 30.0–36.0)
MCV: 76.1 fL — ABNORMAL LOW (ref 80.0–100.0)
Monocytes Absolute: 1 10*3/uL (ref 0.1–1.0)
Monocytes Relative: 12 %
Neutro Abs: 6.3 10*3/uL (ref 1.7–7.7)
Neutrophils Relative %: 74 %
Platelet Count: 445 10*3/uL — ABNORMAL HIGH (ref 150–400)
RBC: 4.27 MIL/uL (ref 3.87–5.11)
RDW: 14.5 % (ref 11.5–15.5)
WBC Count: 8.6 10*3/uL (ref 4.0–10.5)
nRBC: 0 % (ref 0.0–0.2)

## 2019-02-25 MED ORDER — IOHEXOL 300 MG/ML  SOLN
100.0000 mL | Freq: Once | INTRAMUSCULAR | Status: AC | PRN
  Administered 2019-02-25: 100 mL via INTRAVENOUS

## 2019-02-25 NOTE — Progress Notes (Unsigned)
Vietnamese interpretor Mardene Celeste [phone]. Pt reports pain in L lower abdomen since last Wednesday. Pain radiates towards her back. Pt reports that when she has severe pain she "feels very hot" and she has to take oxycodone. Pt last took tramadol 3 weeks ago. Pt reports vomiting yesterday. Pt reports that she is maily drinking milk, had some soup this morning, Pt reports having normal bowel movements. Denies pain w/urination. Pt states that the "pain is more severe than when I had surgery". Incisions clean/dry and intact slight bruising noted around incisions. Spoke w/ Heywood Iles NP. CT and blood work ordered  Pt instructed to go to [facility] hospital for her CT Scan. Pt's daughter voiced understanding of plan of care. Directions to [facility] printed to give to patient.

## 2019-02-25 NOTE — Telephone Encounter (Signed)
Mr. [name] called for his sister.  She is having lower abdominal pain not controlled with tramadol and iibuprofen. She states she has a fever and has been "burning up" for several days.  They do not have a thermometer to check her temperature.

## 2019-02-25 NOTE — Telephone Encounter (Signed)
Per Dr. Denman George [name] should come to the office for a nurse evaluation.  I told [name] this and an appointment for 02/25/2019 at 1130 has been made. [name] verbalized understanding.

## 2019-02-25 NOTE — Progress Notes (Signed)
See RN note.

## 2019-02-26 ENCOUNTER — Telehealth: Payer: Self-pay

## 2019-02-26 ENCOUNTER — Other Ambulatory Visit: Payer: Self-pay | Admitting: Gynecologic Oncology

## 2019-02-26 DIAGNOSIS — R1032 Left lower quadrant pain: Secondary | ICD-10-CM

## 2019-02-26 MED ORDER — AMOXICILLIN-POT CLAVULANATE 875-125 MG PO TABS: 1.0000 | ORAL_TABLET | Freq: Two times a day (BID) | ORAL | 0 refills | Status: DC

## 2019-02-26 NOTE — Progress Notes (Signed)
Dr. Denman George reviewed recent CT scan.  Feels noted changes are from expected postop changes.  Plan to prescribe Augmentin empirically to see if patient's symptoms improve per Dr. Denman George.

## 2019-02-26 NOTE — Telephone Encounter (Signed)
LM for Ms Hillier to call Dr. Serita Grit office at [phone] to discuss the results of CT scan and recommendations.

## 2019-02-27 NOTE — Telephone Encounter (Signed)
Spoke with brother [name] and told him that Dr. Denman George said that the CT scan showed surgical changes in the abdomen with some inflammation. She sent in an antibiotic called Augmentin to take twice a day for a week to help with inflammation and see if this decreases her pain. [name] will pick up ATB for sister. Told him that she needs to take it with food. King verbalized understanding.

## 2019-03-20 ENCOUNTER — Inpatient Hospital Stay: Payer: BC Managed Care – PPO | Attending: Gynecologic Oncology | Admitting: Gynecologic Oncology

## 2019-03-20 ENCOUNTER — Other Ambulatory Visit: Payer: Self-pay

## 2019-03-20 ENCOUNTER — Encounter: Payer: Self-pay | Admitting: Gynecologic Oncology

## 2019-03-20 VITALS — BP 124/82 | HR 95 | Temp 97.5°F | Resp 17 | Ht 63.0 in | Wt 166.6 lb

## 2019-03-20 DIAGNOSIS — G8929 Other chronic pain: Secondary | ICD-10-CM | POA: Diagnosis not present

## 2019-03-20 DIAGNOSIS — Z90721 Acquired absence of ovaries, unilateral: Secondary | ICD-10-CM | POA: Insufficient documentation

## 2019-03-20 DIAGNOSIS — N801 Endometriosis of ovary: Secondary | ICD-10-CM | POA: Insufficient documentation

## 2019-03-20 DIAGNOSIS — N803 Endometriosis of pelvic peritoneum: Secondary | ICD-10-CM

## 2019-03-20 DIAGNOSIS — R102 Pelvic and perineal pain: Secondary | ICD-10-CM | POA: Insufficient documentation

## 2019-03-20 DIAGNOSIS — N838 Other noninflammatory disorders of ovary, fallopian tube and broad ligament: Secondary | ICD-10-CM

## 2019-03-20 DIAGNOSIS — N736 Female pelvic peritoneal adhesions (postinfective): Secondary | ICD-10-CM

## 2019-03-20 NOTE — Patient Instructions (Signed)
Dr Denman George removed the left tube and ovary during your surgery because it contained a cyst of endometriosis. There was no cancer in the cyst.  Your endometriosis needs to be treated with medicines to suppress your menstrual cycle so that you do not develop more pelvic pain or a new cyst on the right side. Birth control pills are a common treatment for endometriosis.  Dr Denman George is recommending that you seek care with an OBGYN doctor for treatment of your endometriosis. Dr Denman George is a cancer doctor and does not treat endometriosis which is not a cancer.  Dr Denman George recommends the following OBGYN's: Dr Bobbye Charleston - (ph) [phone] Dr Paula Compton - (ph) [phone]   Dr Serita Grit office can be reached at [phone] if you have questions or if your pain gets worse rather than better over time.

## 2019-03-20 NOTE — Progress Notes (Signed)
Follow-up Note: Gyn-Onc  Consult was initially requested by Dr. Marcelo Baldy for the evaluation of [place] 43 y.o. female  CC:  Chief Complaint  Patient presents with  . Ovarian mass, left  . Post-op Follow-up    Assessment/Plan:  Ms. [name]  is a 43 y.o.  year old who is 4 weeks s/p robotic assisted left salpingo-oophorectomy and pelvic lysis of adhesions for a left ovarian endometrioma.  She has persistent postop pain, however this is improved. Her CT scan showed no evidence of visceral injury. She appears comfortable today in our visit and has no pain/discomfort reaction to my physical exam. I think that she is healing normally and may have chronic pelvic pain from her underlying endometriosis.  In the presence of an interpreter I counseled the patient regarding her diagnosis of endometriosis.  I discussed that this was nonmalignant.  Discussed that I do not medically manage endometriosis.  I explained that in order to prevent development of a new endometrioma or progressive pelvic pain she should consider seeing an OB/GYN for treatment of ovarian suppression for example with oral contraceptive pills or Mirena IUD.  I provided the patient with contact information for OB/GYN providers.  I counseled the patient that opioid analgesics are associated with development of addiction and therefore given that I do not see pathology that necessitates ongoing opioid prescription, I am recommending she continue to medicate her pain with over-the-counter nonprescription medications such as Tylenol and ibuprofen.  She was counseled that if her pain progresses and becomes worse rather than better she will call us and we will prescribe a new CT scan to reevaluate the pelvic and abdominal structures for occult injury.  HPI: Ms Hankerson Mikeya is a 43 year old P2 who is seen in consultation at the request of Dr Odie Sera for evaluation of a complex left ovarian cyst and fibroid uterus.  The patient  reports a 14-monthhistory of left pelvic pain that is constant.  She has been seen in the ER twice for this pain and has been prescribed Percocet.  She reports the pain is constant.  It is not related to her menstrual period additionally she has a history of menorrhagia associated with a known fibroid uterus.  Medical history is significant for obesity with a BMI of 35 kg per metered squared, in addition to type 2 diabetes mellitus for which she takes insulin, and hypertension.  Her most recent hemoglobin A1c was 6.6 performed on December 22, 2018.  The patient's past surgical history is remarkable for 2 prior cesarean sections.  As part of her evaluation she underwent an ultrasound scan which revealed a uterus measuring 11.9 x 5.7 x 6.2 cm.  It contained multiple submucosal, posterior myometrial, and lower uterine segment intramural fibroids the largest of which was 3.1 cm.  The endometrium measured 5 mm.  The right ovary was grossly normal at 2.6 x 2 cm.  The left ovary measured 6.6 x 5.4 x 5.8 cm and contained a complex multiloculated cystic mass measuring 5.1 x 3.9 x 5.4 cm.  The dominant loculated component contain diffuse low-level echoes.  2 small components was separated by thin internal area of septation which appeared anechoic.  Both ovaries contained normal pulse Doppler flow.  She had undergone a CT scan on December 14, 2018 this had revealed no evidence of acute abnormalities within the solid abdominal organs.  There was a leiomyomatous uterus identified.  The left adnexa contained a multiloculated cystic structure associated with the left  adnexa with somewhat thick walls measuring 5.4 cm.  The findings were consistent with either endometrioma, hemorrhagic cyst, or tubo-ovarian abscess.  Of note she has not been febrile with other symptoms consistent with infection.  A Ca1 25 was drawn on December 14, 2018 and was 47.8.  CEA drawn on December 14, 2018 was 2.9.  Interval Hx:  On 02/16/19 she  underwent a robotic assisted laparoscopic left salpingo-oophorectomy and pelvic adhesiolysis.  Intraoperative findings were significant for stage IV endometriosis with an obliterated pelvic posterior cul-de-sac, the rectum was adherent to the posterior uterus, there was a 10 cm left ovarian mass densely adherent to the ovarian fossa, posterior uterus and rectum.  The cyst was filled with chocolate colored fluid.  There was a normal-appearing right ovary and fibroid uterus.  There were adhesions between the omentum and the anterior pelvic wall.  [place] pathology section pathology was consistent with endometrioma.  Final pathology confirmed a benign endometrioma, and negative washings.  No malignancy was identified.  Postoperatively she reported significant pelvic pain greater than anticipated.  Due to the complex surgical dissection concern for visceral injury was had and therefore she underwent a CT scan of the abdomen and pelvis on 02/25/2019. It showed: the left ureter appears to likely scarred along the posterior margin of a left parametrial/adnexal fluid collection. It is difficult to separate the ureter from this collection due to local inflammation. Strictly speaking a leak from the ureter cannot be readily excluded although no specific evidence of such a leak. Unfortunately the delayed images do not extend down into the pelvis and thus the contrast filled ureter cannot be assessed below the iliac crests. In the left parametrial region, a somewhat flaccid/irregular appearing fluid collection is present in about the same location as the previous adnexal cyst. This collection measures 4.6 by 2.4 by 4.9 cm (volume = 28 cm^3) with an internal density of 30 Hounsfield units (fluid reference: 11 Hounsfield units), with enhancing borders. Questionable miniscule amount of internal gas, much less than 1 cubic cm.  She was empirically prescribed augmentin for 2 weeks for concern regarding possible developing  pelvic abscess.   Since that time her pain has modestly improved.  She reports that preoperatively and immediately postoperatively her pain was 10 out of 10 however it is now 4 out of 10.  When she does not take analgesic medications she feels pain which makes her feel fatigued she feels tingling over the left abdominal wall.  She denies leakage of fluid, hematuria or change in bladder or bowel habit.  She has no problems with voiding urine or passing stools.  She denies nausea or emesis.  She denies fever.  Current Meds:  Outpatient Encounter Medications as of 03/20/2019  Medication Sig  . atorvastatin (LIPITOR) 80 MG tablet TAKE 1 TABLET(80 MG) BY MOUTH DAILY AT 6 PM (Patient taking differently: Take 80 mg by mouth daily at 6 PM. )  . B-D UF III MINI PEN NEEDLES 31G X 5 MM MISC USE DAILY AS DIRECTED TO INJECT INSULIN  . ezetimibe (ZETIA) 10 MG tablet Take 10 mg by mouth daily.  Marland Kitchen gabapentin (NEURONTIN) 100 MG capsule Take 200 mg by mouth daily.  Marland Kitchen ibuprofen (ADVIL) 800 MG tablet Take 1 tablet (800 mg total) by mouth every 8 (eight) hours as needed for moderate pain.  . Insulin Glargine (LANTUS SOLOSTAR) 100 UNIT/ML Solostar Pen ADMINISTER 25 UNITS UNDER THE SKIN DAILY AT 10:00PM (Patient taking differently: Inject 15 Units into the skin daily. )  .  lisinopril (PRINIVIL,ZESTRIL) 10 MG tablet TAKE 1 TABLET(10 MG) BY MOUTH DAILY (Patient taking differently: Take 10 mg by mouth daily. )  . metFORMIN (GLUCOPHAGE-XR) 500 MG 24 hr tablet Take 2 tablets (1,000 mg total) by mouth daily with breakfast. For diabetes.  Marland Kitchen oxyCODONE (OXY IR/ROXICODONE) 5 MG immediate release tablet Take 1 tablet (5 mg total) by mouth every 4 (four) hours as needed for severe pain. Do not take and drive  . senna-docusate (SENOKOT-S) 8.6-50 MG tablet Take 2 tablets by mouth at bedtime. Do not take if having diarrhea  . traMADol (ULTRAM) 50 MG tablet Take 2 tablets (100 mg total) by mouth every 6 (six) hours as needed.  .  [DISCONTINUED] amoxicillin-clavulanate (AUGMENTIN) 875-125 MG tablet Take 1 tablet by mouth 2 (two) times daily.   No facility-administered encounter medications on file as of 03/20/2019.    Allergy: No Known Allergies  Social Hx:   Social History   Socioeconomic History  . Marital status: Single    Spouse name: n/a  . Number of children: 2  . Years of education: 6th grade  . Highest education level: Not on file  Occupational History  . Occupation: nail salon    Employer: [employer]   Tobacco Use  . Smoking status: Never Smoker  . Smokeless tobacco: Never Used  Substance and Sexual Activity  . Alcohol use: No  . Drug use: No  . Sexual activity: Not on file  Other Topics Concern  . Not on file  Social History Narrative   From [place], though family is [place], came to the [place] in 1999. Lives with her brother and her  two children.   Patient is right-handed.   Patient not drinking any caffeine.   Social Determinants of Health   Financial Resource Strain:   . Difficulty of Paying Living Expenses: Not on file  Food Insecurity:   . Worried About Charity fundraiser in the Last Year: Not on file  . Ran Out of Food in the Last Year: Not on file  Transportation Needs:   . Lack of Transportation (Medical): Not on file  . Lack of Transportation (Non-Medical): Not on file  Physical Activity:   . Days of Exercise per Week: Not on file  . Minutes of Exercise per Session: Not on file  Stress:   . Feeling of Stress : Not on file  Social Connections:   . Frequency of Communication with Friends and Family: Not on file  . Frequency of Social Gatherings with Friends and Family: Not on file  . Attends Religious Services: Not on file  . Active Member of Clubs or Organizations: Not on file  . Attends Archivist Meetings: Not on file  . Marital Status: Not on file  Intimate Partner Violence:   . Fear of Current or Ex-Partner: Not on file  . Emotionally Abused:  Not on file  . Physically Abused: Not on file  . Sexually Abused: Not on file    Past Surgical Hx:  Past Surgical History:  Procedure Laterality Date  . CESAREAN SECTION    . ROBOTIC ASSISTED BILATERAL SALPINGO OOPHERECTOMY Left 02/16/2019   Procedure: XI ROBOTIC ASSISTED LEFT  SALPINGO OOPHORECTOMY; LYSIS OF ADHESIONS;  Surgeon: Everitt Amber, MD;  Location: [facility];  Service: Gynecology;  Laterality: Left;    Past Medical Hx:  Past Medical History:  Diagnosis Date  . Acute ischemic stroke ([place]) 01/31/2013   Right side weakness arm and leg  .  Diabetes mellitus without complication ([place])   . Hepatitis    pt denies  . Hypercholesteremia   . Hypertension   . Ovarian cyst    Left    Past Gynecological History:  C/s x 2, fibroid uterus No LMP recorded.  Family Hx:  Family History  Problem Relation Age of Onset  . Stroke Mother   . Hypertension Mother   . Hyperlipidemia Mother   . Stroke Father   . Hypertension Father   . Hyperlipidemia Father   . Heart disease Father   . Hyperlipidemia Brother     Review of Systems:  Constitutional  Feels in pain and generally unwell.  ENT Normal appearing ears and nares bilaterally Skin/Breast  No rash, sores, jaundice, itching, dryness Cardiovascular  No chest pain, shortness of breath, or edema  Pulmonary  No cough or wheeze.  Gastro Intestinal  No nausea, vomitting, or diarrhoea. No bright red blood per rectum, + pain Genito Urinary  No frequency, urgency, dysuria, + pelvic pain,  Musculo Skeletal  No myalgia, arthralgia, joint swelling or pain  Neurologic  No weakness, numbness, change in gait,  Psychology  No depression, anxiety, insomnia.   Vitals:  Blood pressure 124/82, pulse 95, temperature (!) 97.5 F (36.4 C), temperature source Axillary, resp. rate 17, height 5' 3"  (1.6 m), weight 166 lb 9.6 oz (75.6 kg), SpO2 99 %.  Physical Exam: WD in NAD Neck  Supple NROM, without any enlargements.   Lymph Node Survey No cervical supraclavicular or inguinal adenopathy Cardiovascular  Pulse normal rate, regularity and rhythm. S1 and S2 normal.  Lungs  Clear to auscultation bilateraly, without wheezes/crackles/rhonchi. Good air movement.  Skin  No rash/lesions/breakdown  Psychiatry  Alert and oriented to person, place, and time  Abdomen  Normoactive bowel sounds, abdomen soft, non-tender and obese without evidence of hernia. Well healed incisions.  Back No CVA tenderness Genito Urinary  Vulva/vagina: Normal external female genitalia.  No lesions. No discharge or bleeding.  Bladder/urethra:  No lesions or masses, well supported bladder  Vagina: normal  Cervix: Normal appearing, no lesions.  Uterus: anterverted, 12+cm, mobile, no parametrial involvement or nodularity. No tenderness on exam  Adnexa: no discrete masses. Rectal  Good tone, no masses no cul de sac nodularity.  Extremities  No bilateral cyanosis, clubbing or edema.   Thereasa Solo, MD  03/20/2019, 11:38 AM

## 2020-02-10 LAB — COLOGUARD: COLOGUARD: NEGATIVE

## 2020-07-21 IMAGING — CT CT ABD-PELV W/ CM
2 of 5 series · 15 of 46 positions shown, 17 images · IV contrast (APPLIED)
Comparison: 12/14/2018 CT scan

CLINICAL DATA: Worsening abdominal pain, left salpingo-oophorectomy
with lysis of adhesions on 02/16/2019 (9 days ago).

EXAM:
CT ABDOMEN AND PELVIS WITH CONTRAST
TECHNIQUE: Multidetector CT imaging of the abdomen and pelvis was performed
using the standard protocol following bolus administration of
intravenous contrast.
CONTRAST:  100mL OMNIPAQUE IOHEXOL 300 MG/ML  SOLN

[Series 3: abdomen 5.0 · axial · 0.94mm/px · z∈[+612,+1032]mm · 12 of 97 slices shown, 14 images]
[im 7/97  soft-tissue]
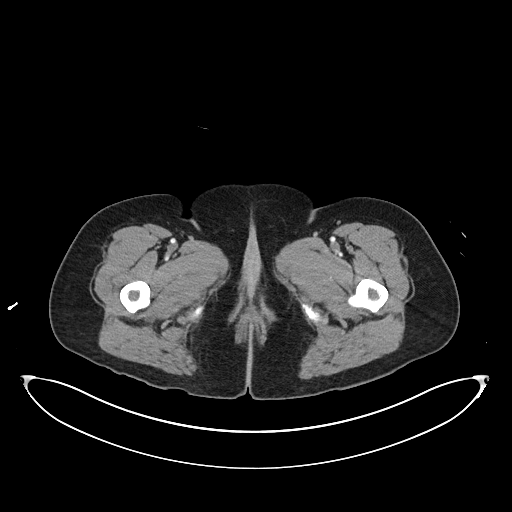
[im 7/97  bone]
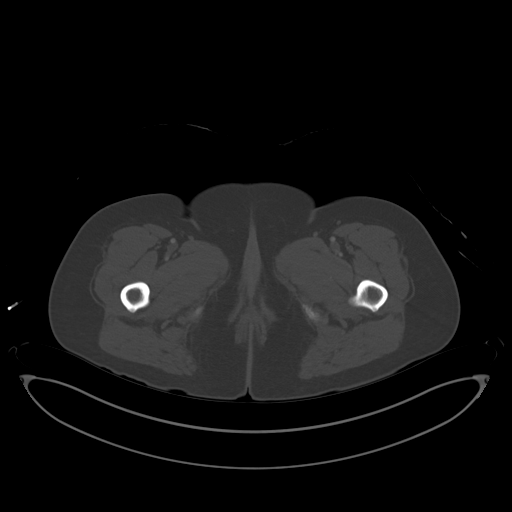
[im 13/97  soft-tissue]
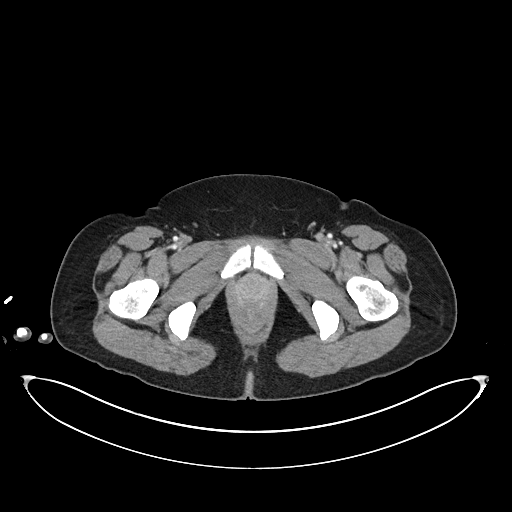
[im 25/97  soft-tissue]
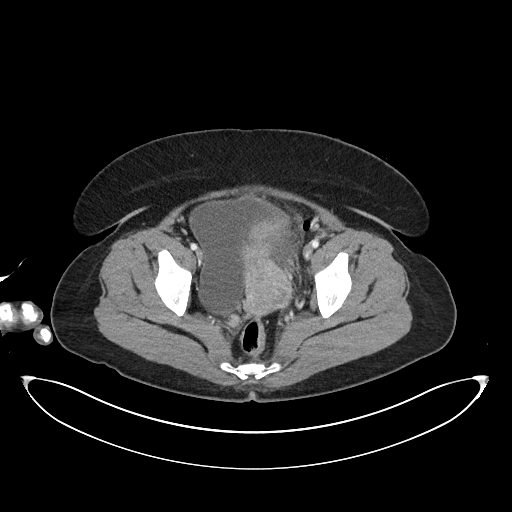
[im 31/97  soft-tissue]
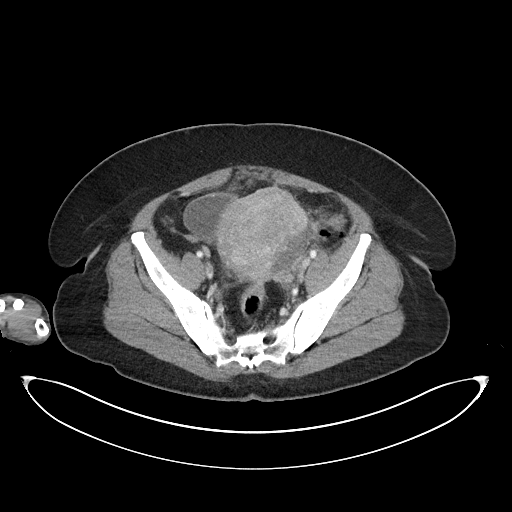
[im 37/97  soft-tissue]
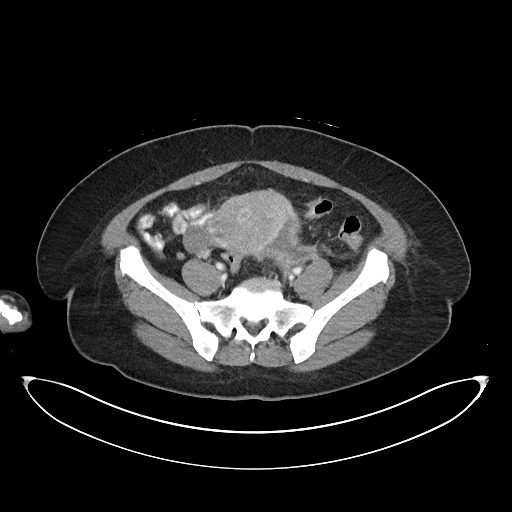
[im 43/97  soft-tissue]
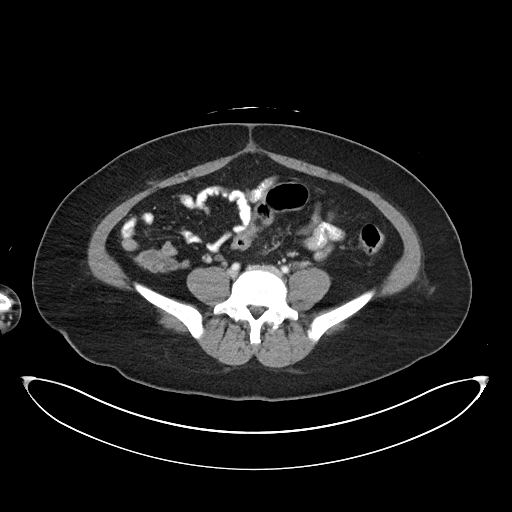
[im 55/97  soft-tissue]
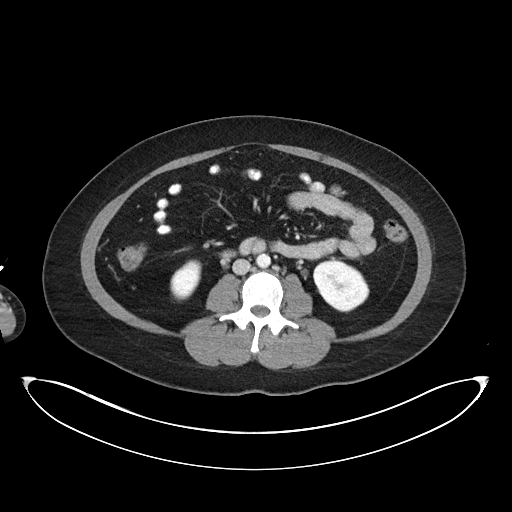
[im 61/97  soft-tissue]
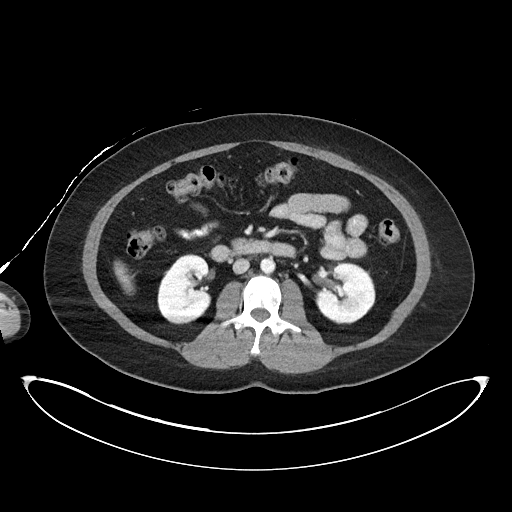
[im 67/97  soft-tissue]
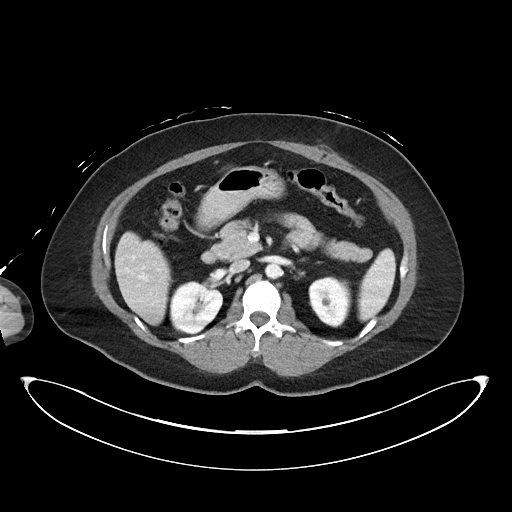
[im 67/97  bone]
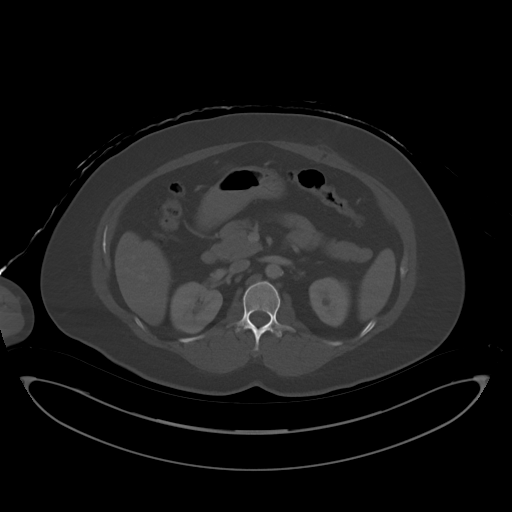
[im 73/97  soft-tissue]
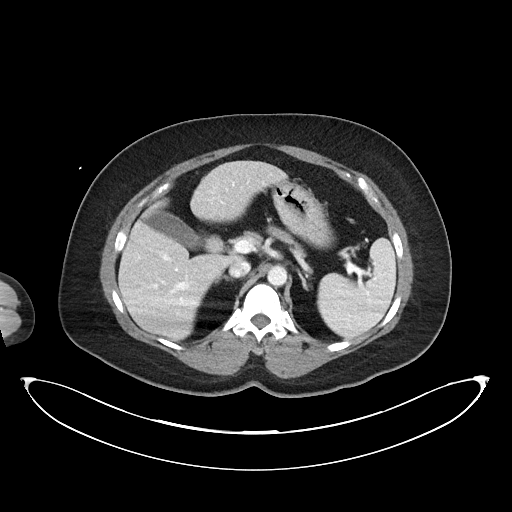
[im 85/97  soft-tissue]
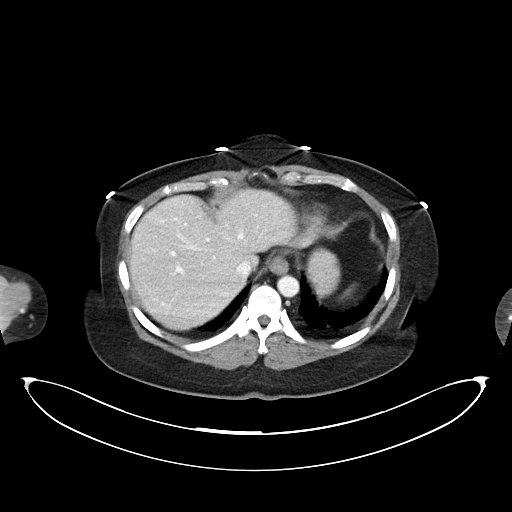
[im 91/97  soft-tissue]
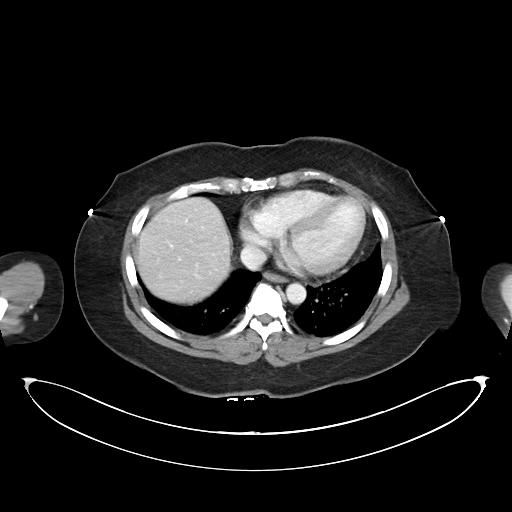

[Series 6: abdomen 3.0 mpr cor · coronal · 0.94mm/px · 3 of 110 slices shown]
[im 37/110  soft-tissue]
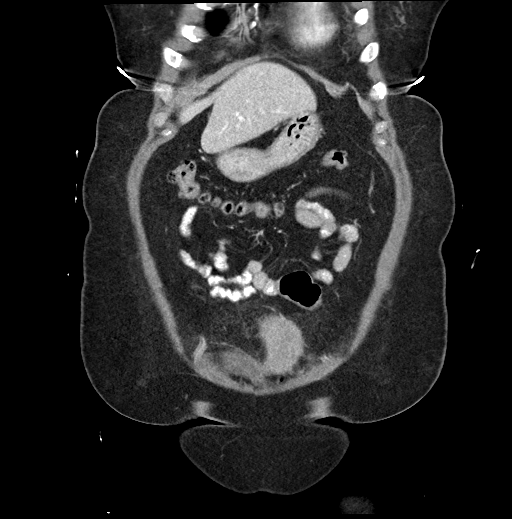
[im 49/110  soft-tissue]
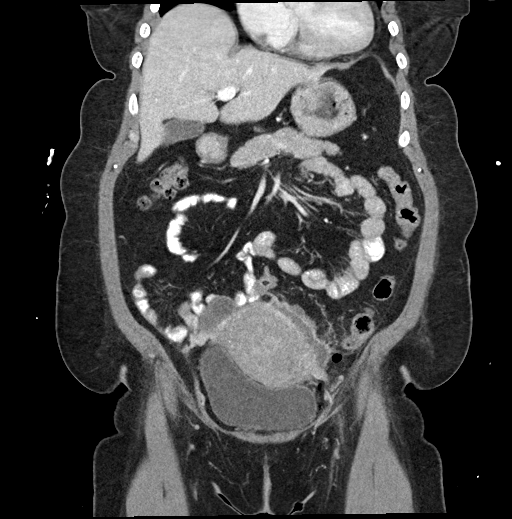
[im 61/110  soft-tissue]
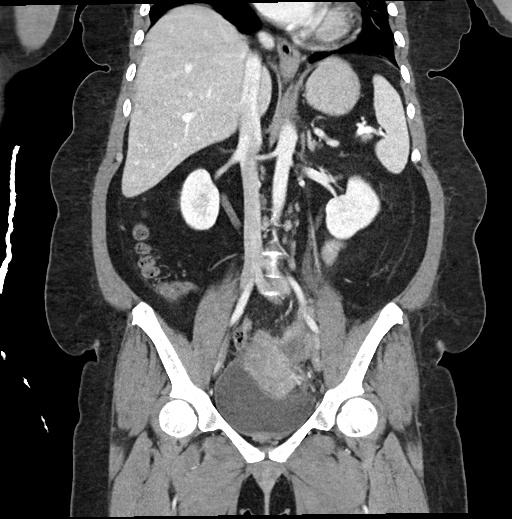

[15 of 46 positions shown; findings below may reference images not displayed]

FINDINGS: Lower chest: Mosaic attenuation in the lung bases is otherwise
nonspecific although a component may be from atelectasis.

Mild cardiomegaly.

Mild descending thoracic aortic atherosclerotic calcification.

Hepatobiliary: Unremarkable

Pancreas: Unremarkable

Spleen: Unremarkable

Adrenals/Urinary Tract: Adrenal glands normal. No hydronephrosis or
hydroureter. The renal parenchyma appears normal.

The left ureter appears to likely scarred along the posterior margin
of a left parametrial/adnexal fluid collection. It is difficult to
separate the ureter from this collection due to local inflammation.
Strictly speaking a leak from the ureter cannot be readily excluded
although no specific evidence of such a leak. Unfortunately the
delayed images do not extend down into the pelvis and thus the
contrast filled ureter cannot be assessed below the iliac crests.

Stomach/Bowel: Normal appendix. No dilated bowel or specific
complicating feature related to the bowel.

Vascular/Lymphatic: Aortoiliac atherosclerotic vascular disease.

Reproductive: The uterus measures 13.9 cm in length and demonstrates
heterogeneous and somewhat nodular enhancement in the myometrium
suggesting fibroids or adenomyosis. No thickening of the endometrium
is identified.

In the left parametrial region, a somewhat flaccid/irregular
appearing fluid collection is present in about the same location as
the previous adnexal cyst. This collection measures 4.6 by 2.4 by
4.9 cm (volume = 28 cm^3) with an internal density of 30
Hounsfield units (fluid reference: 11 Hounsfield units), with
enhancing borders. Questionable miniscule amount of internal gas,
much less than 1 cubic cm, on image 95/7.

A cystic lesion associated with the right ovary measures 2.2 cm in
diameter on image 58/3.

Other: Abnormal locules of extraluminal gas are present along the
lower left paracolic gutter and left side of the prevesical space of
[place]. Locules of gas are also present along the left pelvic
sidewall. These locules do not demonstrate marginal wall thickening
or typical appearance for abscess.

Musculoskeletal: Unremarkable
IMPRESSION: 1. In the left parametrial position, a flaccid appearing 28 cubic cm
complex collection is present with enhancing borders. Questionable
miniscule locule of internal gas. This could represent residuum from
the prior endometrioma, reaccumulation, or conceivably abscess,
although does not have the taut appearance of typical abscess. The
left ureter is thought to likely pass along the posterior margin of
this process, ureteral injury is not positively identified but is
not readily excluded given the indistinctness of the ureter as it
passes behind this process.
2. There are locules of gas in the prevesical space, left pelvic
sidewall, and along the pelvic gutter. These are small in volume and
did not have thick enhancing walls or characteristic findings of
abscess, but it is somewhat unusual 9 days out from laparoscopy to
still have gas along the operative site. I do not see an obvious
point of perforation of bowel to account for this appearance, but
surveillance imaging over the next several days may be warranted to
ensure that this gas resolves and does not worsened.
3. Aortic atherosclerosis.
4. Mosaic attenuation in the lung bases is otherwise nonspecific
although a component of atelectasis is not excluded.
5. Heterogeneous and nodular enhancement of the myometrium
suggesting fibroids or adenomyosis. Mildly prominent uterine size.
6. Mild cardiomegaly.

Aortic Atherosclerosis ([id number]).

## 2024-04-30 ENCOUNTER — Ambulatory Visit: Admitting: Family Medicine

## 2024-05-04 ENCOUNTER — Ambulatory Visit: Admitting: Family Medicine

## 2024-06-25 ENCOUNTER — Ambulatory Visit: Payer: Self-pay | Admitting: Family Medicine
